# Patient Record
Sex: Male | Born: 1955 | Race: Black or African American | Hispanic: No | Marital: Single | Smoking: Light tobacco smoker
Health system: Southern US, Community
[De-identification: ages and names within clinical notes are randomized; demographics above are authoritative.]

## PROBLEM LIST (undated history)

## (undated) DIAGNOSIS — I639 Cerebral infarction, unspecified: Secondary | ICD-10-CM

## (undated) DIAGNOSIS — F1010 Alcohol abuse, uncomplicated: Secondary | ICD-10-CM

## (undated) DIAGNOSIS — I714 Abdominal aortic aneurysm, without rupture, unspecified: Secondary | ICD-10-CM

## (undated) DIAGNOSIS — I619 Nontraumatic intracerebral hemorrhage, unspecified: Secondary | ICD-10-CM

## (undated) DIAGNOSIS — N183 Chronic kidney disease, stage 3 unspecified: Secondary | ICD-10-CM

## (undated) DIAGNOSIS — F1410 Cocaine abuse, uncomplicated: Secondary | ICD-10-CM

## (undated) DIAGNOSIS — I10 Essential (primary) hypertension: Secondary | ICD-10-CM

## (undated) DIAGNOSIS — D649 Anemia, unspecified: Secondary | ICD-10-CM

## (undated) DIAGNOSIS — R569 Unspecified convulsions: Secondary | ICD-10-CM

## (undated) DIAGNOSIS — R739 Hyperglycemia, unspecified: Secondary | ICD-10-CM

## (undated) DIAGNOSIS — E039 Hypothyroidism, unspecified: Secondary | ICD-10-CM

## (undated) DIAGNOSIS — K219 Gastro-esophageal reflux disease without esophagitis: Secondary | ICD-10-CM

## (undated) DIAGNOSIS — N289 Disorder of kidney and ureter, unspecified: Secondary | ICD-10-CM

## (undated) HISTORY — DX: Abdominal aortic aneurysm, without rupture, unspecified: I71.40

## (undated) HISTORY — PX: EYE SURGERY: SHX253

## (undated) HISTORY — DX: Abdominal aortic aneurysm, without rupture: I71.4

---

## 2005-08-17 ENCOUNTER — Ambulatory Visit: Payer: Self-pay | Admitting: Physical Medicine & Rehabilitation

## 2005-08-17 ENCOUNTER — Encounter: Payer: Self-pay | Admitting: Emergency Medicine

## 2005-08-17 ENCOUNTER — Inpatient Hospital Stay (HOSPITAL_COMMUNITY): Admission: EM | Admit: 2005-08-17 | Discharge: 2005-08-27 | Payer: Self-pay | Admitting: Emergency Medicine

## 2005-09-22 ENCOUNTER — Inpatient Hospital Stay (HOSPITAL_COMMUNITY): Admission: AD | Admit: 2005-09-22 | Discharge: 2005-10-01 | Payer: Self-pay | Admitting: Neurology

## 2005-09-22 ENCOUNTER — Ambulatory Visit: Payer: Self-pay | Admitting: Pulmonary Disease

## 2005-09-22 ENCOUNTER — Ambulatory Visit: Payer: Self-pay | Admitting: Cardiology

## 2005-09-22 ENCOUNTER — Encounter: Payer: Self-pay | Admitting: Emergency Medicine

## 2005-09-23 ENCOUNTER — Encounter (INDEPENDENT_AMBULATORY_CARE_PROVIDER_SITE_OTHER): Payer: Self-pay | Admitting: Cardiology

## 2005-11-13 ENCOUNTER — Inpatient Hospital Stay (HOSPITAL_COMMUNITY): Admission: EM | Admit: 2005-11-13 | Discharge: 2005-11-14 | Payer: Self-pay | Admitting: Emergency Medicine

## 2006-11-23 ENCOUNTER — Emergency Department (HOSPITAL_COMMUNITY): Admission: EM | Admit: 2006-11-23 | Discharge: 2006-11-23 | Payer: Self-pay | Admitting: Emergency Medicine

## 2007-02-19 IMAGING — CR DG CHEST 1V PORT
1 series · 1 of 1 positions shown · non-contrast
Comparison: 09/22/2005 and earlier the same day.

CLINICAL DATA: Reposition of ET tube.  
 PORTABLE CHEST - 1 VIEW:

[view not recorded]
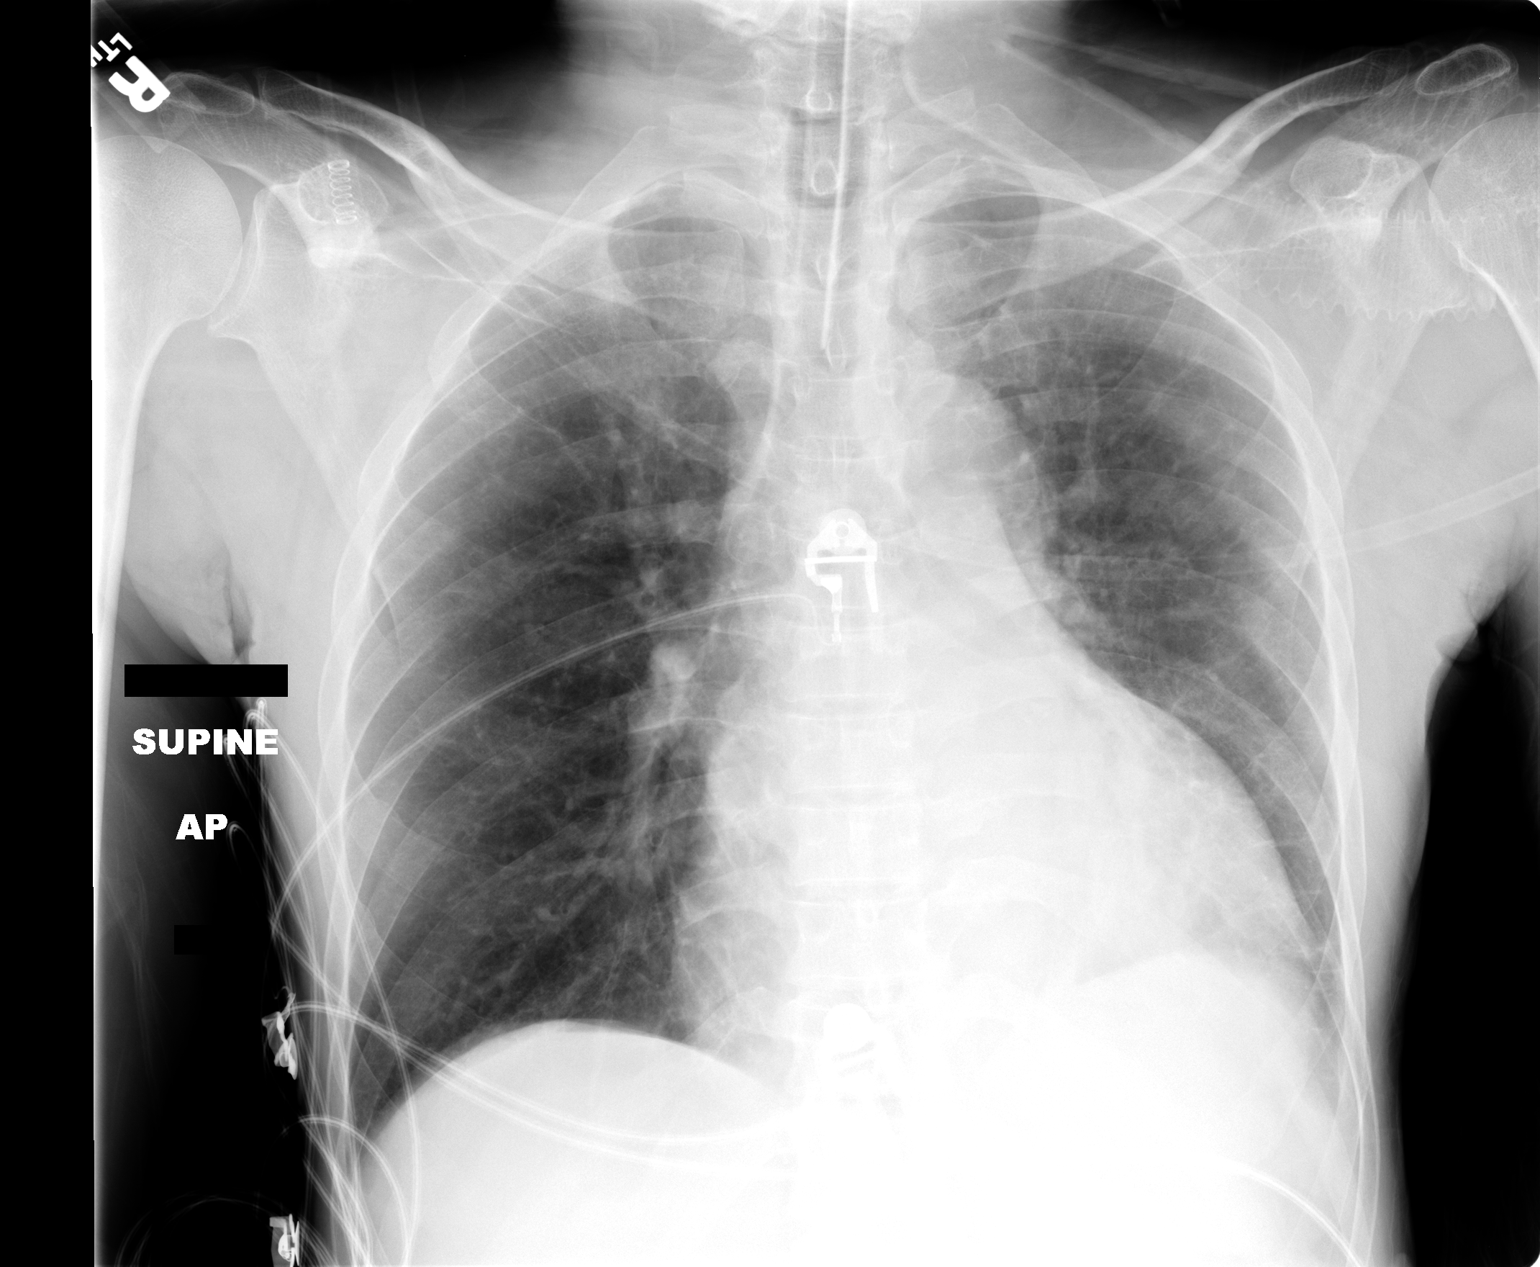

[1 of 1 positions shown; findings below may reference images not displayed]

Endotracheal tube tip is now 4.6 cm above the base of the carina.  The cardiopericardial silhouette remains enlarged.  Interstitial markings are diffusely coarsened with chronic features. Atelectasis at the left base again noted.
IMPRESSION: 1.   ET tube tip is 4.6 cm above the base of the carina. 
 2.  Cardiomegaly with basilar atelectasis.

## 2007-02-20 IMAGING — CT CT HEAD W/O CM
1 of 2 series · 13 of 30 positions shown, 17 images · non-contrast
Comparison: 09/22/05 and 08/17/05.
 HEAD CT WITHOUT CONTRAST:

CLINICAL DATA: Intracranial hemorrhage.
TECHNIQUE: Contiguous axial images were obtained from the base of the skull through the vertex, according to standard protocol, without contrast.

[Series 2: brain · axial · 0.47mm/px · z∈[+138,+265]mm · 13 of 40 slices shown, 17 images]
[im 3/40  brain]
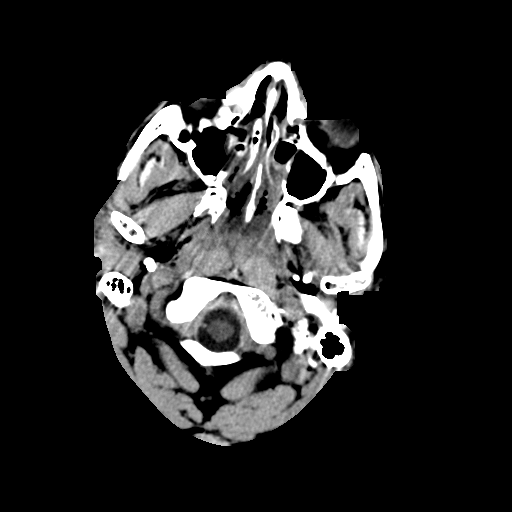
[im 3/40  bone]
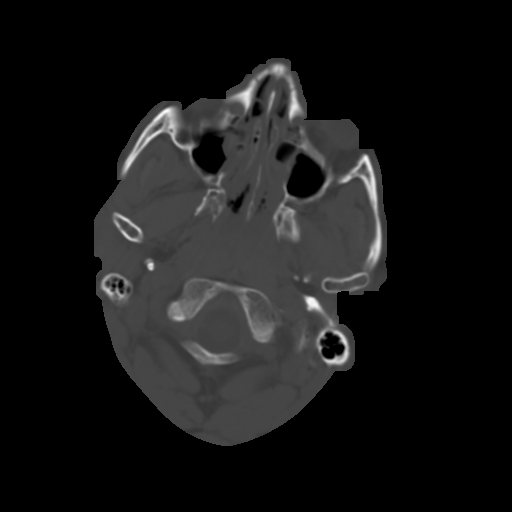
[im 6/40  brain]
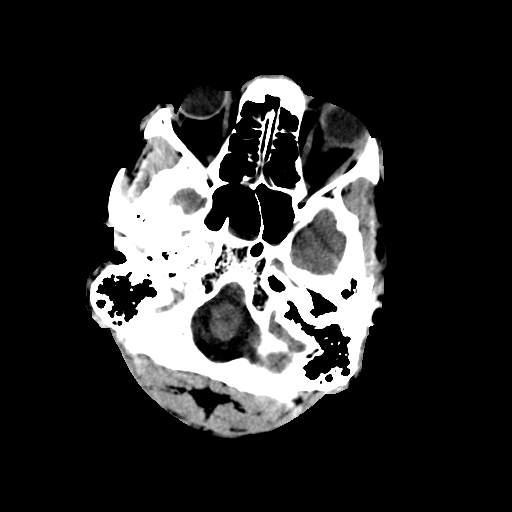
[im 9/40  brain]
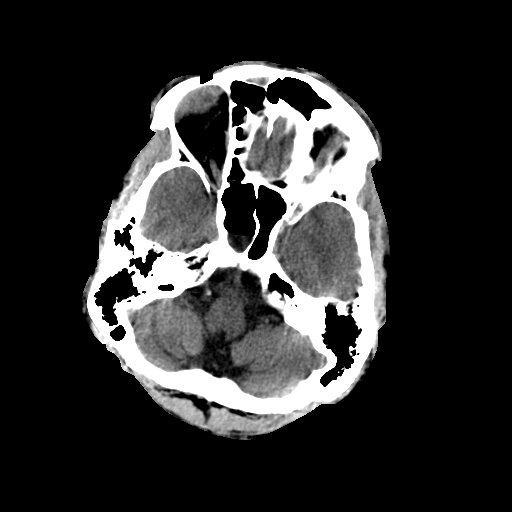
[im 12/40  brain]
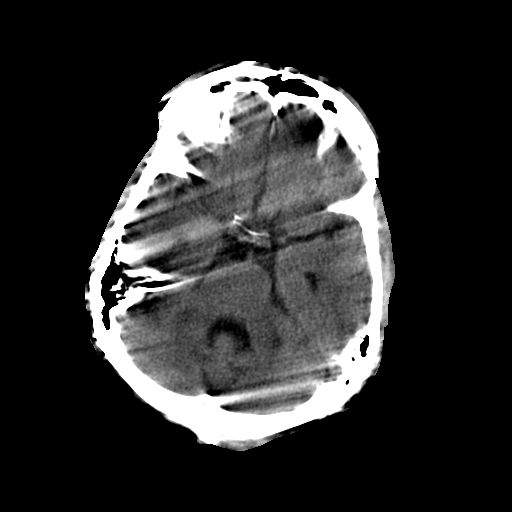
[im 14/40  brain]
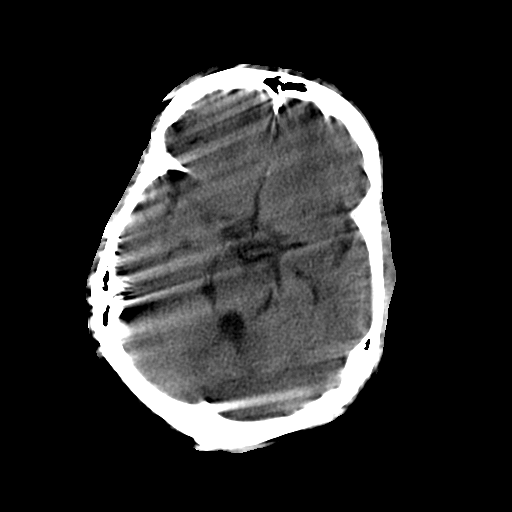
[im 14/40  bone]
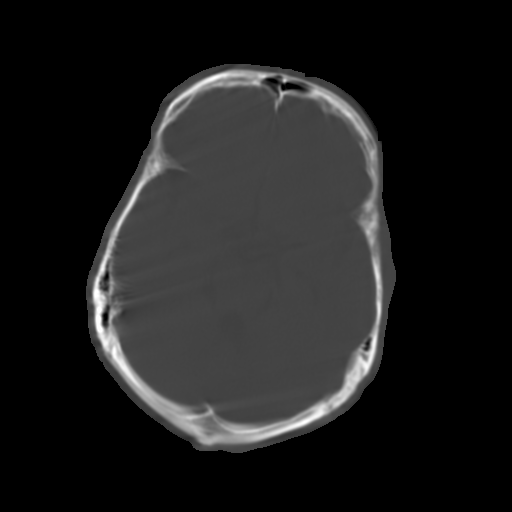
[im 17/40  brain]
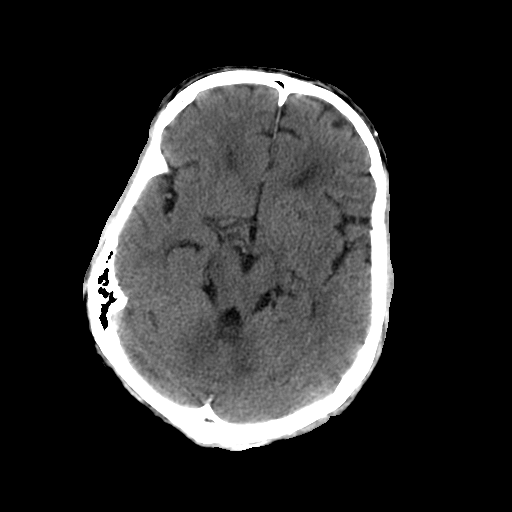
[im 20/40  brain]
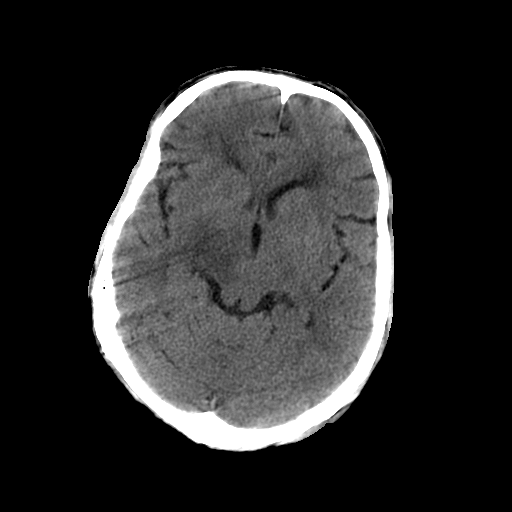
[im 23/40  brain]
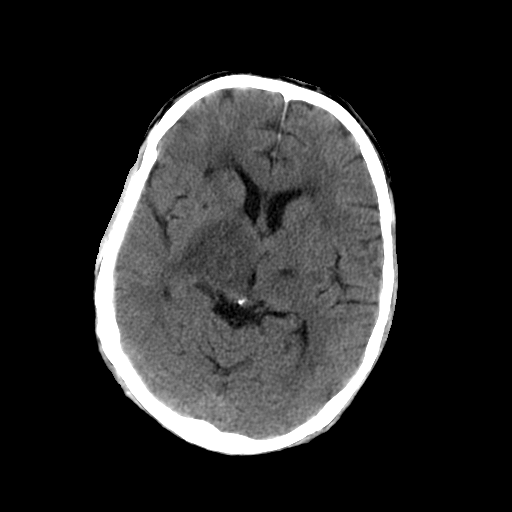
[im 26/40  brain]
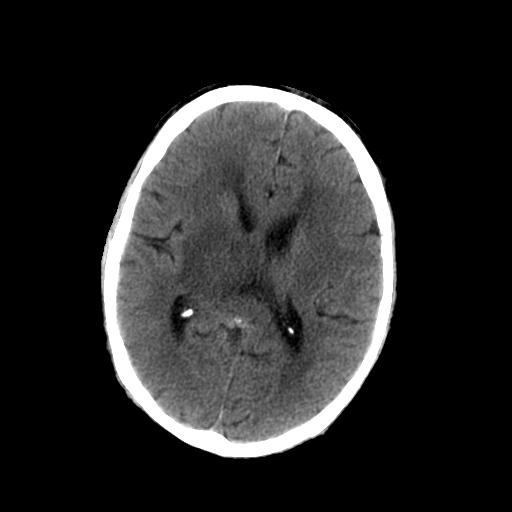
[im 26/40  bone]
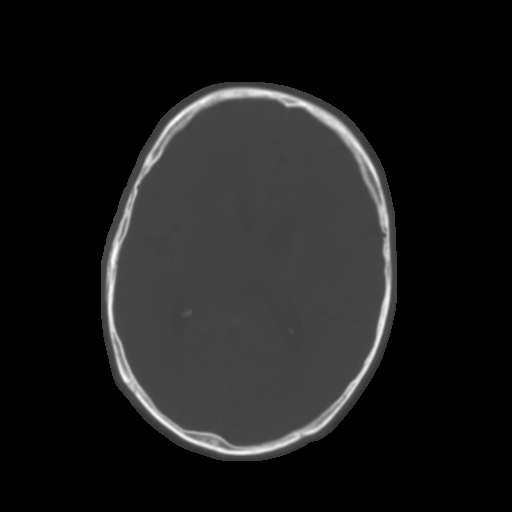
[im 28/40  brain]
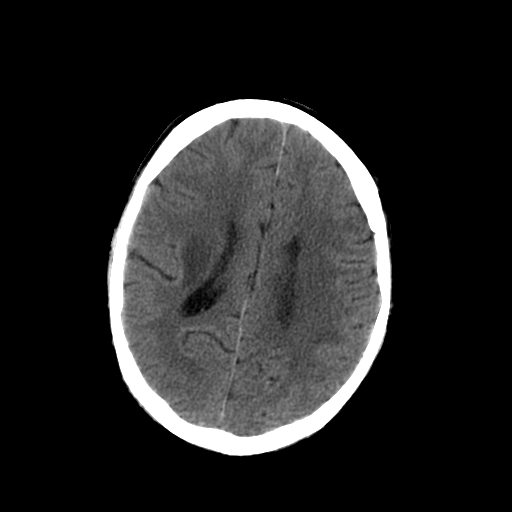
[im 31/40  brain]
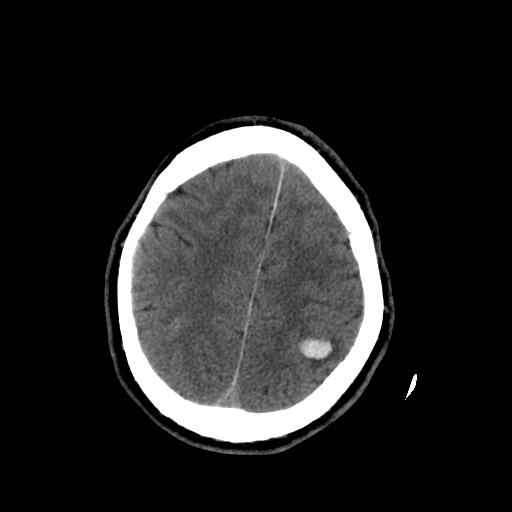
[im 34/40  brain]
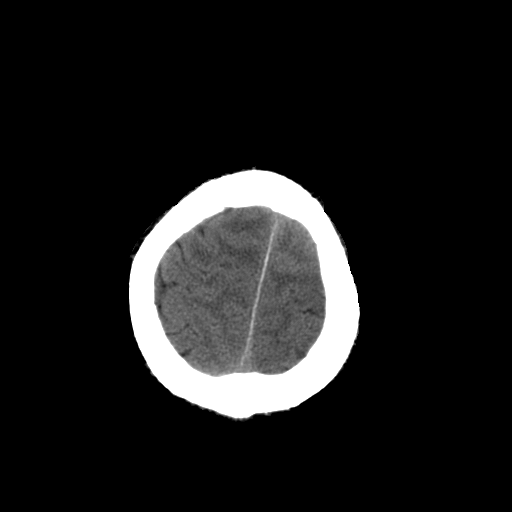
[im 37/40  brain]
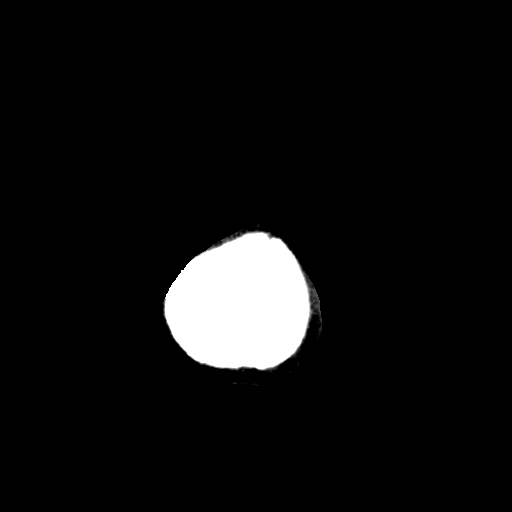
[im 37/40  bone]
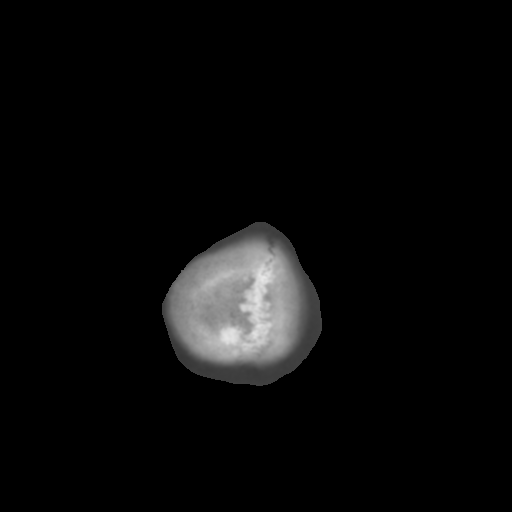

[13 of 30 positions shown; findings below may reference images not displayed]

FINDINGS: Again seen is a left parietal lobe hemorrhage, which appears unchanged.  Edema is again seen in the right thalamus, also without change.  There is minimal right to left midline shift.  No new hemorrhage is identified.  Mass effect on the third ventricle is unchanged.  White matter ischemic change and left basal ganglia and right caudate lacunar infarctions are again noted.  The patient?s examination is otherwise unchanged.  NG tube remains in place.  There is opacification of scattered ethmoid air cells.
IMPRESSION: 1.  No interval change in left parietal hemorrhage.
 2.  Evolution of right thalamic hemorrhage.  As noted on prior exam, underlying mass is not excluded on non-contrast head CT.

## 2007-04-24 ENCOUNTER — Emergency Department (HOSPITAL_COMMUNITY): Admission: EM | Admit: 2007-04-24 | Discharge: 2007-04-24 | Payer: Self-pay | Admitting: Emergency Medicine

## 2007-05-13 ENCOUNTER — Emergency Department (HOSPITAL_COMMUNITY): Admission: EM | Admit: 2007-05-13 | Discharge: 2007-05-13 | Payer: Self-pay | Admitting: Emergency Medicine

## 2007-09-20 ENCOUNTER — Inpatient Hospital Stay (HOSPITAL_COMMUNITY): Admission: EM | Admit: 2007-09-20 | Discharge: 2007-10-02 | Payer: Self-pay | Admitting: Emergency Medicine

## 2008-06-17 ENCOUNTER — Emergency Department (HOSPITAL_COMMUNITY): Admission: EM | Admit: 2008-06-17 | Discharge: 2008-06-17 | Payer: Self-pay | Admitting: Emergency Medicine

## 2009-02-19 IMAGING — CR DG CHEST 1V PORT
1 series · 1 of 1 positions shown · non-contrast
Comparison: 09/19/06.

CLINICAL DATA: PICC placement. 
 PORTABLE CHEST ? 1 VIEW:

[view not recorded]
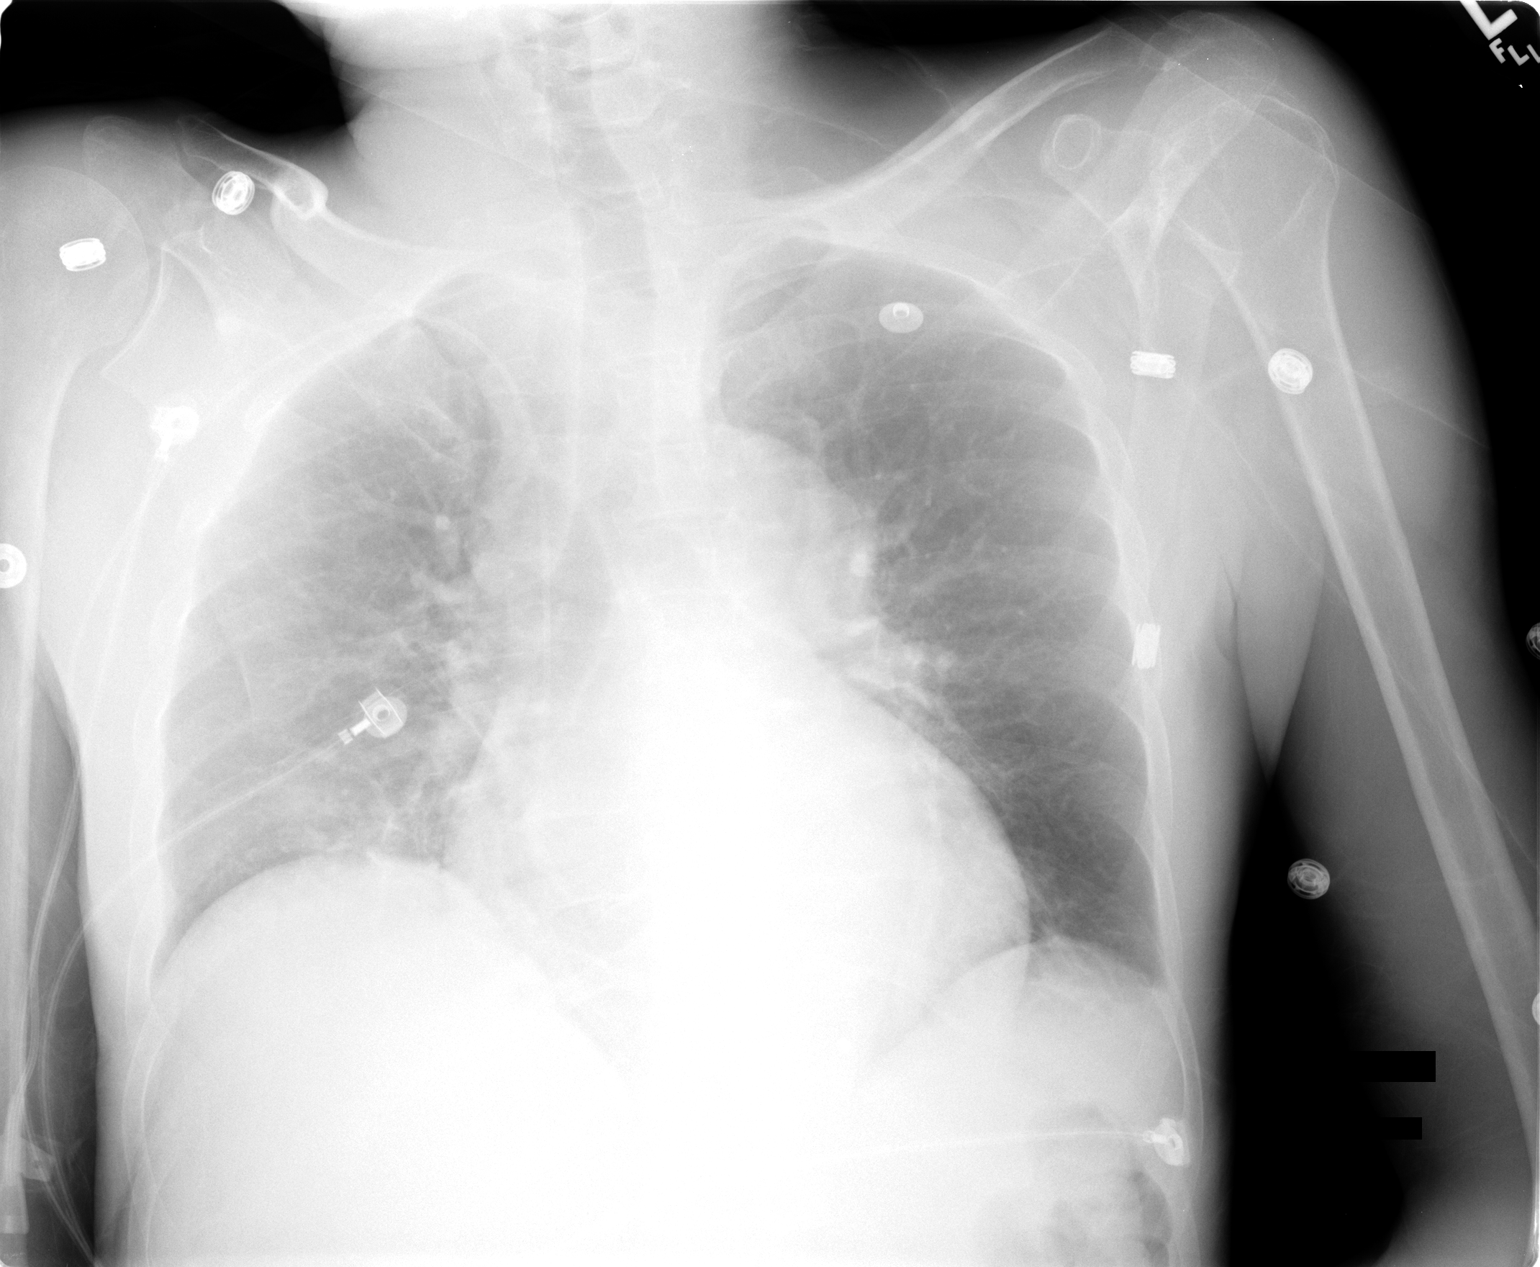

[1 of 1 positions shown; findings below may reference images not displayed]

FINDINGS: Patient has a new right-sided PICC at the superior cavoatrial junction.  No pneumothorax.  There is mild right base atelectasis.  The lungs are otherwise clear.  Cardiomegaly.
IMPRESSION: 1.  PICC in place without complicating features. 
 2.  Mild right basilar atelectasis.

## 2009-06-09 ENCOUNTER — Ambulatory Visit (HOSPITAL_COMMUNITY): Admission: RE | Admit: 2009-06-09 | Discharge: 2009-06-09 | Payer: Self-pay | Admitting: Ophthalmology

## 2009-07-18 ENCOUNTER — Ambulatory Visit (HOSPITAL_COMMUNITY): Admission: RE | Admit: 2009-07-18 | Discharge: 2009-07-18 | Payer: Self-pay | Admitting: Family Medicine

## 2010-01-17 ENCOUNTER — Emergency Department (HOSPITAL_COMMUNITY): Admission: EM | Admit: 2010-01-17 | Discharge: 2010-01-17 | Payer: Self-pay | Admitting: Emergency Medicine

## 2010-01-17 ENCOUNTER — Encounter: Payer: Self-pay | Admitting: Orthopedic Surgery

## 2010-01-18 ENCOUNTER — Ambulatory Visit: Payer: Self-pay | Admitting: Orthopedic Surgery

## 2010-01-18 DIAGNOSIS — S92009A Unspecified fracture of unspecified calcaneus, initial encounter for closed fracture: Secondary | ICD-10-CM

## 2010-01-25 ENCOUNTER — Encounter: Payer: Self-pay | Admitting: Orthopedic Surgery

## 2010-01-30 ENCOUNTER — Ambulatory Visit (HOSPITAL_COMMUNITY): Admission: RE | Admit: 2010-01-30 | Discharge: 2010-01-30 | Payer: Self-pay | Admitting: Ophthalmology

## 2010-02-08 ENCOUNTER — Ambulatory Visit (HOSPITAL_COMMUNITY): Admission: AD | Admit: 2010-02-08 | Discharge: 2010-02-08 | Payer: Self-pay | Admitting: Ophthalmology

## 2010-03-01 ENCOUNTER — Ambulatory Visit: Payer: Self-pay | Admitting: Orthopedic Surgery

## 2010-04-06 ENCOUNTER — Telehealth: Payer: Self-pay | Admitting: Orthopedic Surgery

## 2010-04-10 ENCOUNTER — Telehealth: Payer: Self-pay | Admitting: Orthopedic Surgery

## 2010-04-18 ENCOUNTER — Encounter: Payer: Self-pay | Admitting: Orthopedic Surgery

## 2010-10-02 NOTE — Miscellaneous (Signed)
Summary: Nursing Home order  Nursing Home order   Imported By: Ihor Austin 01/22/2010 10:17:47  _____________________________________________________________________  External Attachment:    Type:   Image     Comment:   External Document

## 2010-10-02 NOTE — Progress Notes (Signed)
Summary: decreased PT to 2 x per week  Phone Note Other Incoming Call back at [facility] from [facility]   Summary of Call: says patient said he is walking just as well as he was before injury, wants to know if they can decrease fro 3 x week to 1 or 2, I advised yes as long as they were satisfied with his progress Initial call taken by: Peter Minium,  April 10, 2010 4:06 PM

## 2010-10-02 NOTE — Letter (Signed)
Summary: History form  History form   Imported By: Ruffin Pyo 01/26/2010 08:56:35  _____________________________________________________________________  External Attachment:    Type:   Image     Comment:   External Document

## 2010-10-02 NOTE — Assessment & Plan Note (Signed)
Summary: 30 WK RE-CK/XRAY HEEL/FX CARE/[place] MEDICAID/CAF   Visit Type:  Follow-up Referring Provider:  [facility] Primary Provider:  Dr. Cindie Laroche  CC:  left heelfracture.Marland Kitchen  History of Present Illness: I saw Kurt Nelson in the office today for a followup visit.  He is a 55 years old man with the complaint of:  left heel fracture.  left heel pain after fall on 01/17/10.  Left foot xrays [facility] 01/17/10.  Xrays today OOP.  XRAYS FRACTURE HEALED AND THE SUBTALAR JOINT LOOKS GOOD    EXAM THE FOOT IS PALNTARGRADE, NO TENDERNESS OR SWELLING, ANKLE ROM IS NORMAL         Impression & Recommendations:  Problem # 1:  CALCANEAL FRACTURE, LEFT (ICD-825.0) Assessment Improved  Orders: Foot x-ray complete, minimum 3 views [id number]) Post-Op Check [id number])  Patient Instructions: 1)  Please schedule a follow-up appointment as needed.

## 2010-10-02 NOTE — Progress Notes (Signed)
Summary: WB status?  Phone Note Other Incoming   Summary of Call: Mike/[facility] wants to know the WB status for Her Gerhardt ([birth year]) Mike's # [phone] Initial call taken by: Ruffin Pyo,  April 10, 2010 3:12 PM  Follow-up for Phone Call        as tolerated  Follow-up by: Arther Abbott MD,  April 10, 2010 3:13 PM  Additional Follow-up for Phone Call Additional follow up Details #1::        Advised Ronalee Belts of doctor's reply Additional Follow-up by: Ruffin Pyo,  April 10, 2010 3:45 PM

## 2010-10-02 NOTE — Medication Information (Signed)
Summary: [employer] merchand[employer]   Imported By: Ihor Austin 03/07/2010 17:27:15  _____________________________________________________________________  External Attachment:    Type:   Image     Comment:   External Document

## 2010-10-02 NOTE — Progress Notes (Signed)
Summary: call from [facility], request for PT  Phone Note Other Incoming   Caller: [place] facility Summary of Call: Maudie Mercury from [facility] called to request order for physical therapy at their facility for patient to be able to start walking w/walker again. [facility] use [facility].  Ph# at [facility], [place], [place]. Initial call taken by: Ihor Austin,  April 06, 2010 10:06 AM  Follow-up for Phone Call        ok Follow-up by: Arther Abbott MD,  April 09, 2010 8:22 AM

## 2010-10-02 NOTE — Letter (Signed)
Summary: Medication list  Medication list   Imported By: Ruffin Pyo 01/31/2010 16:52:21  _____________________________________________________________________  External Attachment:    Type:   Image     Comment:   External Document

## 2010-10-02 NOTE — Assessment & Plan Note (Signed)
Summary: [facility] FOL/UP/FX LT HEEL BONE/MEDICARE,MEDICD/CAF   Visit Type:  new patient Referring Provider:  [facility] Primary Provider:  Dr. Cindie Laroche  CC:   left heel pain.Marland Kitchen  History of Present Illness: I saw Kurt Nelson in the office today for an initial visit.  He is a 55 years old man with the complaint of:  left heel pain after fall on 01/17/10.  Left foot xrays [facility] 01/17/10.  The patient has pain in his LEFT heel which is moderate in severity present for one day after a fall when he lost balance with his walker, pain is constant.  Associated symptoms  swelling.  Physical Exam  Additional Exam:  vital signs weight was deferred height was deferred, he is afebrile pulse of 88 respiratory rate of 20, appearance is normal grooming is normal  He's oriented x3 his mood and affect are normal  Is in a wheelchair he can walk without pain or and his walker  His heel is swollen and tender his range of motion in the ankle joint is normal.  The ankle is stable.  Muscle tone is normal.  Skin is intact.  Pulses in the foot are normal, temperature normal.  Lymph nodes negative.  Sensation is normal.  Toes are downgoing.  Coordination and balance are deferred.     Impression & Recommendations:  Problem # 1:  CALCANEAL FRACTURE, LEFT (ICD-825.0) Assessment New  short leg nonweightbearing cast for 6 weeks and x-ray out of plaster  Orders: New Patient Level III [id number]) EMR Misc Charge Code [facility])  Patient Instructions: 1)  do not weight bear  2)  transport in wheel chair only for 6 weeks  3)  then xrays OOP

## 2010-10-02 NOTE — Miscellaneous (Signed)
Summary: [facility] plan of care  [facility] plan of care   Imported By: Ruffin Pyo 04/23/2010 13:47:46  _____________________________________________________________________  External Attachment:    Type:   Image     Comment:   External Document

## 2010-11-19 LAB — GRAM STAIN

## 2010-11-19 LAB — CBC
HCT: 31.7 % — ABNORMAL LOW (ref 39.0–52.0)
Hemoglobin: 10.5 g/dL — ABNORMAL LOW (ref 13.0–17.0)
MCHC: 33.1 g/dL (ref 30.0–36.0)
MCV: 84.6 fL (ref 78.0–100.0)
RDW: 12.5 % (ref 11.5–15.5)

## 2010-11-19 LAB — BASIC METABOLIC PANEL
BUN: 27 mg/dL — ABNORMAL HIGH (ref 6–23)
CO2: 19 mEq/L (ref 19–32)
CO2: 20 mEq/L (ref 19–32)
Calcium: 9.3 mg/dL (ref 8.4–10.5)
Creatinine, Ser: 2.21 mg/dL — ABNORMAL HIGH (ref 0.4–1.5)
GFR calc Af Amer: 38 mL/min — ABNORMAL LOW (ref >60)
GFR calc non Af Amer: 31 mL/min — ABNORMAL LOW (ref >60)
Glucose, Bld: 137 mg/dL — ABNORMAL HIGH (ref 70–99)
Potassium: 5.3 mEq/L — ABNORMAL HIGH (ref 3.5–5.1)
Sodium: 135 mEq/L (ref 135–145)

## 2010-11-19 LAB — POCT I-STAT 4, (NA,K, GLUC, HGB,HCT): HCT: 34 % — ABNORMAL LOW (ref 39.0–52.0)

## 2010-11-19 LAB — HEMOGLOBIN AND HEMATOCRIT, BLOOD
HCT: 32.9 % — ABNORMAL LOW (ref 39.0–52.0)
Hemoglobin: 10 g/dL — ABNORMAL LOW (ref 13.0–17.0)

## 2010-12-06 LAB — BASIC METABOLIC PANEL
Chloride: 110 mEq/L (ref 96–112)
GFR calc non Af Amer: 26 mL/min — ABNORMAL LOW (ref >60)
Glucose, Bld: 74 mg/dL (ref 70–99)
Potassium: 6.2 mEq/L — ABNORMAL HIGH (ref 3.5–5.1)
Sodium: 137 mEq/L (ref 135–145)

## 2011-01-15 NOTE — H&P (Signed)
Kurt Nelson, CRAKER                  ACCOUNT NO.:  [id number]   MEDICAL RECORD NO.:  [id number]          PATIENT TYPE:  INP   LOCATION:  A220                          FACILITY:  [facility]   PHYSICIAN:  Paula Compton. Willey Blade, MD       DATE OF BIRTH:  Sep 19, 1955   DATE OF ADMISSION:  09/20/2007  DATE OF DISCHARGE:  LH                              HISTORY & PHYSICAL   HISTORY OF PRESENT ILLNESS:  This patient is a 55 year old African-  American male who presented to the emergency room from [facility] rest  home, after he had noted possible fever.  He was evaluated in the  emergency room and found to have a fever of 105.0.  His urinalysis  revealed too numerous to count white cells.  He had not previously  experienced dysuria, but he had some discomfort when the catheter was  placed.  He was found to be in acute renal failure with a BUN and  creatinine of 67 and 6.29.  Previous values had been normal.  He states  he had been eating and drinking very little for several days.   PAST MEDICAL HISTORY:  1. Hypertension.  2. Stroke.  3. GERD.   MEDICATIONS:  1. Labetalol 600 mg b.i.d.  2. Amlodipine 10 mg daily.  3. Remeron 15 mg q.h.s.  4. Dilantin 300 mg q.h.s.  5. Benicar HCT 40/25 daily.  6. Omeprazole 20 daily.   ALLERGIES:  Penicillin which caused a rash.   SOCIAL HISTORY:  He is retired from [employer], does not  smoke or drink.  He lives at [facility].   REVIEW OF SYSTEMS:  His throat has felt a little irritated recently with  swallowing.  He denies chest pain, abdominal pain or vomiting.  He  states he had a loose stool yesterday.   PHYSICAL EXAMINATION:  VITAL SIGNS:  His temperature 105.0 rectally,  pulse 118, respirations 22, blood pressure 147/94, oxygen saturation  95%. GENERAL:  Weak-appearing but alert with what appears to be a  chronic speech alteration from a stroke.  HEENT:  He has a left dense cataract, no scleral icterus.  The pharynx  reveals few remaining  teeth.  No exudate is visible.  NECK:  No lymphadenopathy or thyromegaly.  LUNGS:  Clear.  HEART:  Tachycardia with no murmurs.  ABDOMEN:  Nontender.  No hepatosplenomegaly.  EXTREMITIES:  No cyanosis, clubbing or edema.  SKIN:  Warm and dry.  NEURO:  Gait was not tested.   LABORATORY DATA:  White count 14.8, hemoglobin 9.6, platelets 208,000,  89 segs, 5 lymphs.  Sodium 135, potassium 5.3, chloride 105, bicarb 18,  glucose 154, BUN 67, creatinine 6.29, calcium 9.0.  Urinalysis reveals  too numerous to count white cells, 21 to 50 red cells and many bacteria.  His Dilantin level is pending.  His chest x-ray reveals no acute  infiltrate.   IMPRESSION:  1. Urinary tract infection.  2. Acute renal acute renal failure/dehydration.  3. Hypertension.  4. Normocytic anemia.  5. Hyperglycemia.  6. Seizure disorder.  7. Gastroesophageal  reflux disease.  8. History of stroke.  9. Pharyngitis.  Strep screen is negative.   PLAN:  Blood and urine cultures.  1. Blood cultures x2.  2. Urine culture.  3. Rocephin 1 gram IV q.24 h.  4. Vancomycin 1 gram IV now.  5. Foley catheter.  6. IV hydration.  7. Continue labetalol and amlodipine, but hold Benicar HCT.      Paula Compton. Willey Blade, MD  Electronically Signed     ROF/MEDQ  D:  09/20/2007  T:  09/20/2007  Job:  [id number]   cc:   Unk Lightning, MD  Fax: [phone]

## 2011-01-15 NOTE — Discharge Summary (Signed)
Kurt Nelson, Kurt Nelson                  ACCOUNT NO.:  [id number]   MEDICAL RECORD NO.:  [id number]          PATIENT TYPE:  INP   LOCATION:  A220                          FACILITY:  [facility]   PHYSICIAN:  Unk Lightning, MDDATE OF BIRTH:  1956/08/15   DATE OF ADMISSION:  09/20/2007  DATE OF DISCHARGE:  LH                               DISCHARGE SUMMARY   The patient is a 55 year old African American male who presented to the  emergency room with increasing fever, temperature he came in with 103.  In the emergency room, found to have a UTI, considered urosepsis.  He  also was noted to have renal failure which is probably a component of  acute on chronic with a BUN of 67 and a creatinine of 6.29.  Patient was  admitted, given IV fluids, given Rocephin, and then ultimately  vancomycin.  Cultures were drawn from the blood revealing E. coli,  septicemia.  While in the hospital, he continued to have a spiking high  temperature, was placed on a cooling blanket.  His hypertension was well  controlled initially then seemed to spike  through the latter part of  his admission.  His E. coli septicemia resolved, his blood pressure was  under better control with the addition of 2 other agents for blood  pressure control, and he was deemed to be clinically stable.  His BUN  and creatinine dropped to 52 and 4.19.  He was followed by renal which  will follow him closely.   DISCHARGE MEDICINES:  1. Labetalol 600 mg p.o. b.i.d.  2. Norvasc 10 mg daily.  3. Benicar 50/25 p.o. daily.  4. Catapres TTS patch 2 weekly.  5. Minoxidil 2.5 mg p.o. t.i.d.   He will follow up in the office in 1 week's time for monitoring of renal  function.      Unk Lightning, MD  Electronically Signed     RMD/MEDQ  D:  10/01/2007  T:  10/01/2007  Job:  [id number]

## 2011-01-15 NOTE — Consult Note (Signed)
NAMESHYMIR, LAHRMAN                  ACCOUNT NO.:  [id number]   MEDICAL RECORD NO.:  [id number]          PATIENT TYPE:  INP   LOCATION:  A220                          FACILITY:  [facility]   PHYSICIAN:  Alison Murray, M.D.DATE OF BIRTH:  1956-05-29   DATE OF CONSULTATION:  DATE OF DISCHARGE:                                 CONSULTATION   NEPHROLOGY CONSULTATION   REASON FOR CONSULTATION:  Worsening of renal failure.   HISTORY OF PRESENT ILLNESS:  Mr. Butler is 55 years old African-American  male with past medical history of hypertension, history of cerebral  ischemic stroke and also right basal ganglia hemorrhagic stroke, history  of seizure and chronic renal failure presently was brought for fever and  history of urinary tract infection.  When the patient was admitted his  creatinine was found to be more than 6.0 and hence at this moment  consult is called.  At this moment, it is very difficult to get any more  history from Mr. Slyter, however, checking his work up, the patient seems  to have chronic renal failure at least dating back in 2006 with baseline  creatinine around that time at 1.9.  Since then his creatinine has  increased and recently has been between 2.5 and 3.0.  He has had  previous work up.  The etiology was not known as the patient has history  of uncontrolled hypertension and cocaine abuse, possibly hypertensive  nephrosclerosis __________ during that time.  At this moment, he does  not have any nausea or vomiting.  He denies any shortness of breath,  dizziness or light headedness.   PAST MEDICAL HISTORY:  As stated above patient has past history of  seizure, history of anemia, history of cocaine abuse, history of right  basal ganglia hemorrhage, history of ischemic stroke, history of GERD,  history of seizure, history of anemia, history of hypertension, history  of chronic renal failure, with baseline creatinine between 2.5 and 3.0.   MEDICATIONS:  His medications at  this moment consist of :  1. Norvasc 10 mg p.o. daily.  2. Rocephin 1 gram IV q.24h.  3. Normodyne 600 mg p.o. b.i.d.  4. Remeron 15 mg p.o. nightly  5. Protonix 40 mg p.o. daily.  6. Dilantin 300 mg p.o. nightly.  7. Intravenous fluids are at 100 cc per hour.  8. He is getting Tylenol on a p.r.n. basis.   ALLERGIES:  He is ALLERGIC to PENICILLIN.   SOCIAL HISTORY:  Presently, the patient is a resident of [facility].  Previous history of alcohol abuse and also cocaine use.   REVIEW OF SYSTEMS:  He denies any nausea, vomiting.  His head is feeling  better.  He does not have any nausea, no vomiting.  Appetite is good.   PHYSICAL EXAMINATION:  GENERAL APPEARANCE:  On examination the patient  is continuously shaking with his head and also his hands, probably  secondary to his basal ganglia hemorrhage.  VITAL SIGNS:  Temperature is 99.  Blood pressure  is 148/88.  Pulse of  106.  His urine output over the  last 24 hours was 1700.   LABORATORY DATA:  His white blood cell count is 14.8, hemoglobin 9.6,  hematocrit 28.8.  Sodium is 140, potassium 5.3.  CO2 18.  BUN is 79,  creatinine 6.8. When he came his BUN was 67 and creatinine 6.2.  In  August of 2008 his creatinine had been 2.5.  He has had previous iron  studies with Ferritin of 379 and iron saturation of 32.  He has had an  ultrasound which was done in 2007 which showed right kidney to be 10.1  and left kidney 10.2.   ASSESSMENT:  1. Renal insufficiency at this moment, acute on chronic.  The etiology      for worsening of his renal failure probably may be secondary to      prerenal syndrome, however, other etiologies such as acute tubular      necrosis cannot be ruled out.  2. Underlying chronic renal disease with baseline creatinine about 2.5      to 3.0.  He has a previous history of uncontrolled hypertension,      cocaine abuse, possibly hypertensive nephrosclerosis.  3. History of urinary tract infection.  He is on  antibiotics presently      and he is afebrile.  White blood cell count is __________ elevated.  4. History of anemia.  His iron saturation seems to be normal with      high Ferritin.  He has anemia of chronic disease.  5. History of seizure disorder.  He is on Dilantin.  6. History of hypertension.  Blood pressure seems to be controlled      very well.  7. History of gastroesophageal reflux disease.  He is on Protonix.  8. History of basal ganglia hemorrhage with hemiparesis and profound      shaking.  9. History of ischemic stroke.   RECOMMENDATIONS:  I agree with hydration.  At this moment will do an  ultrasound of the kidneys to see if the patient has obstruction or some  significant change from his previous ultrasound.  I agree with  discontinuation of ACE inhibitor.  Will continue his intravenous fluids  at 125 cc per hour and continue with all his medications and will follow  patient.  I will check his phosphorus and intact PTH.      Alison Murray, M.D.  Electronically Signed     BB/MEDQ  D:  09/22/2007  T:  09/22/2007  Job:  [id number]

## 2011-01-18 NOTE — Discharge Summary (Signed)
Kurt Nelson, Kurt Nelson                  ACCOUNT NO.:  [id number]   MEDICAL RECORD NO.:  [id number]          PATIENT TYPE:  INP   LOCATION:  A204                          FACILITY:  [facility]   PHYSICIAN:  Vanetta Mulders. Dechurch, M.D.DATE OF BIRTH:  [birth year]   DATE OF ADMISSION:  11/13/2005  DATE OF DISCHARGE:  03/15/2007LH                                 DISCHARGE SUMMARY   DIAGNOSES:  1.  Seizure disorder.  2.  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  3.  Ventilator respiratory failure secondary to intracranial hemorrhage      September 22, 2005.  4.  Uncontrolled hypertension.  5.  History of substance abuse though drug screen negative this admission.  6.  Chronic renal insufficiency, probably secondary to hypertension.  7.  Medical noncompliance secondary to financial issues.   Patient is a 55 year old African-American gentleman who has a complicated  past medical history since December when he had two intracranial bleeds  associated with hypertension and probable cocaine abuse. In any event, he  has been doing well at home until today when he had what was described as a  seizure. He presented to the emergency room post ictal. Follow-up CT scan  showed no acute changes. Metabolically, he was intact. He was back to  baseline status at the time I evaluation. In any event, he was seen in  consultation by Dr. Merlene Laughter who recommended Dilantin 300 mg at bedtime to  continue as well as to use brand necessary. He will follow him up as an  outpatient and proceed with an EEG. He was counseled regarding his  compliance and substance abuse.   DISCHARGE MEDICATIONS:  His medications at the time of discharge include:  1.  Clonidine 0.1 b.i.d.  2.  Norvasc 10 mg daily.  3.  Labetalol 600 mg b.i.d.  4.  Hydrochlorothiazide 12.5 mg daily.  5.  Dilantin 300 mg brand necessary at h.s.   He will need a follow-up CMP and Dilantin level  per Dr. Merlene Laughter. He follows  with Dr. Tamala Julian, though he has not seen him in quite some time. In any event,  he is being discharged to home in stable condition. His blood pressures here  in the brief stay in the hospital were much better controlled, this morning  127/84, with medications. Creatinine is 2, BUN is 23, potassium 3.5.      Vanetta Mulders Hillery Jacks, M.D.  Electronically Signed     FED/MEDQ  D:  11/14/2005  T:  11/15/2005  Job:  [id number]   cc:   Tamala Julian, M.D.   Kofi A. Merlene Laughter, M.D.  Fax: [phone]

## 2011-01-18 NOTE — H&P (Signed)
[name], [name]                  ACCOUNT NO.:  [id number]   MEDICAL RECORD NO.:  [id number]          PATIENT TYPE:  INP   LOCATION:  2921                         FACILITY:  [facility]   PHYSICIAN:  Jill Alexanders, M.D.  DATE OF BIRTH:  1956/03/06   DATE OF ADMISSION:  08/17/2005  DATE OF DISCHARGE:                                HISTORY & PHYSICAL   HISTORY OF PRESENT ILLNESS:  Kurt Nelson is a 55 year old right-handed black  male with a history of hypertension that has been poorly controlled. The  patient has apparently been followed by a Dr. Tamala Julian in the [place] area.  He has been placed on blood pressure medications in the past but has not  been on the medications for some time. The patient apparently has had little  medical follow-up. The patient comes to the [facility] Emergency  Room today after he had noted onset of left-sided numbness and weakness that  began around 7 p.m. on the August 16, 2005. The patient was taken back at  that time, noted sudden onset of problems associated with headache. The  patient felt numbness on the face, arm and leg along with weakness. He had  difficulty getting out of the bath tub. The patient has had persistent  symptoms, slight worsening of symptoms in to the day. Again, the patient  denies headache, nausea, vomiting, loss of consciousness. The patient has  noted some slight slurred speech. Because [facility] Room  did not have a functional CT scanner, the patient was sent to [facility] for further evaluation.   CT scan of the head was done. It shows evidence of a right vasoganglion  intracranial hemorrhage with significant hypertension with diastolic blood  pressures in the 118 to 120 range. The patient appears to be alert,  cooperative at this point. Neurology was called for further evaluation. No  intraventricular extension of blood was noted. The patient claims he smokes  marijuana but does not do  any other drugs such as cocaine. He drinks a fifth  of alcohol a day. He smokes a pack of cigarettes a day.   PAST MEDICAL HISTORY:  1.  History of uncontrolled hypertension with medical noncompliance.  2.  Right vasoganglion intracranial hemorrhage without any intraventricular      extensions above. The patient denies any history of surgery in the past.  3.  No other medical problems.   MEDICATIONS:  The is on no medications.   ALLERGIES:  States no known allergies.   SOCIAL HISTORY:  Smokes a packs a cigarettes a day. Drinks a fifth of  alcohol daily. Smokes marijuana. The patient is separated. Has three  children who are alive and well. The patient does not work. Lives with a  girlfriend.   FAMILY HISTORY:  Notable that both parents passed away. Cause of death of  the father is unknown. Mother had history of hypertension. The patient has  two sisters who are alive and well. No brothers.   REVIEW OF SYSTEMS:  Notable for no recent fevers or chills. The patient  denies headache, neck stiffness. Denies shortness of breath, chest pain,  abdominal pain, nausea, vomiting. Denies any problems with dizziness or loss  of consciousness. The patient does note left-sided numbness and weakness as  above.   PHYSICAL EXAMINATION:  VITAL SIGNS:  Blood pressure is 178/117, heart rate  81, respiratory rate 14, temperature afebrile.  GENERAL:  This patient is a thin black male who is alert and oriented at the  time of examination. He wants to know when he can eat.  HEENT:  Head:  Atraumatic. Eyes:  Pupils are round and react to light. A  very dense cataract noted on the left, less dense cataract noted on the  right. Disk is flat on the right. Cannot visualize on the left.  NECK:  Supple. No carotid bruits noted.  LUNGS:  Clear.  CARDIOVASCULAR:  Regular rate and rhythm. No obvious murmurs or rubs noted.  ABDOMEN:  Positive bowel sounds. No organomegaly or tenderness noted.  EXTREMITIES:   Without significant edema.  NEUROLOGICAL:  Cranial nerves as above. The patient has minimal left facial  droop. Pinprick sensation of the left face is depressed compared to the  right. The patient has normal speech pattern. No aphasia. Again, extraocular  movements are relatively full. Visual fields are full. Motor testing reveals  4/5 strength in the left arm, left leg compared to the right. The patient  has good strength on the right side. The patient has decreased pinprick,  soft touch, vibratory sensation in the left arm and left leg compared to the  right. The patient has clumsiness with finger-nose-finger of the left upper  extremity and normal on the right. Clumsiness to toe-to-finger on the left  lower extremity which is normal on the right. Gait was not tested. Deep  tendon reflexes were relatively symmetric. Depression of ankle jerk reflexes  noted bilaterally. Toes are neutral bilaterally.   LABORATORY DATA:  Laboratory values at this time are pending. Chest x-ray  and EKG are pending. CT of the head as above. Some blood work has just  returned indicating a white count of 6.1, hemoglobin 13.2, hematocrit 37.9,  MCV of 86.7, platelets of 251,000. Again, the rest of the blood work is  pending.   IMPRESSION:  1.  Severe uncontrolled hypertension.  2.  Right vasoganglion hemorrhage likely secondary to hypertension.  3.  Medical noncompliance.  4.  Alcohol and tobacco abuse.   This patient will be admitted for observation and treatment. The patient has  mild left hemiparesis, hemisensory deficit at this point with significant  clumsiness on the left side. The primary goal initially will be to treat the  blood pressure and follow neurologic status. The patient apparently did  receive an aspirin through the emergency room. I will hold aspirin at this  point. The patient will be brought in for further management.   PLAN:  1.  Admission to [facility] 3100 stepdown  unit. 2.  Cardene drip with oral Klonopin.  3.  Bedside swallow evaluation.  4.  Thiamine.  5.  Urine drug screen.  6.  Repeat CT of the head in the a.m.  7.  Physical therapy and occupational therapy evaluation come Monday, two      days from now.   We will follow the patient's clinical course while in-house.      Jill Alexanders, M.D.  Electronically Signed     CKW/MEDQ  D:  08/17/2005  T:  08/19/2005  Job:  [id number]   cc:  [facility]  [address] [place] [address]

## 2011-01-18 NOTE — Discharge Summary (Signed)
Kurt Nelson, Kurt Nelson                  ACCOUNT NO.:  [id number]   MEDICAL RECORD NO.:  [id number]          PATIENT TYPE:  INP   LOCATION:  A204                          FACILITY:  [facility]   PHYSICIAN:  Vanetta Mulders. Dechurch, M.D.DATE OF BIRTH:  1956-03-17   DATE OF ADMISSION:  11/13/2005  DATE OF DISCHARGE:  03/15/2007LH                                 DISCHARGE SUMMARY   DISCHARGE DIAGNOSES:  1.  Seizure disorder.  2.  History of right parietal hemorrhagic cerebrovascular accident.  3.  Uncontrolled hypertension.  4.  History of substance abuse.  5.  Noncompliance with medical therapy.   DISPOSITION:  The patient is being discharged to home.   MEDICATIONS:  1.  Labetalol 600 mg b.i.d.  2.  Clonidine 0.1 mg b.i.d.  3.  Hydrochlorothiazide 12.5 daily.  4.  Norvasc 10 daily.  5.  Dilantin 300 mg at bedtime.   FOLLOW UP:  Follow up with Dr. Merlene Laughter in 2 weeks.  The patient is  encouraged to follow up with Dr. Tamala Julian in 2-3 weeks for blood pressure  management.  The patient received medication vouchers per [employer]   HOSPITAL COURSE:  A 55 year old gentleman with a history of a right parietal  hemorrhage in January 2007, associated with uncontrolled hypertension and  probable cocaine abuse who presented to the emergency room with what sounds  like a generalized seizure.  He was postictal initially, but returned to his  baseline status.  He had no new focal events.  CT scan was unremarkable for  new events.  Metabolically, he was intact with the exception of some  decreased potassium which was supplemented.  The patient's blood pressure  initially was uncontrolled.  He was started back on his previous blood  pressure regimen and actually had reasonable control in the brief time in  the hospital.  He was seen in consultation by neurology who recommended  Dilantin prophylaxis.  He is being discharged to home with a plan as noted  above.  At the time of discharge, he is awake, alert  and baseline mental  status, insignificant left upper extremity weakness, but otherwise normal  exam.  He has a lichenified rash in his groin consistent with chronic tinea  cruris and he was given Lotrisone here in the hospital and sent home with a  tube  for further management.  He was seen in consultation by Social Services to  assist him with obtaining some assistance regarding his medical regimen.  He  is encouraged, should he not be able to afford is medications, to be sure to  let his primary care physician know or to follow up with social services.      Vanetta Mulders Hillery Jacks, M.D.  Electronically Signed     FED/MEDQ  D:  11/14/2005  T:  11/15/2005  Job:  [id number]   cc:   Vernon Prey. Tamala Julian, M.D.  Fax: [place]. Merlene Laughter, M.D.  Fax: [phone]

## 2011-01-18 NOTE — H&P (Signed)
NAMEJARRATT, Kurt Nelson                  ACCOUNT NO.:  [id number]   MEDICAL RECORD NO.:  [id number]          PATIENT TYPE:  INP   LOCATION:  2102                         FACILITY:  [place]   PHYSICIAN:  Alyson Locket. Love, M.D.    DATE OF BIRTH:  11/28/1955   DATE OF ADMISSION:  09/22/2005  DATE OF DISCHARGE:                                HISTORY & PHYSICAL   This is the second [facility] admission for this 55 year old right-  handed black male transferred intubated and sedated from [facility] ER for combative behavior with hypertension, abnormal EKG, and  positive cocaine screen associated with left parietal hematoma.   HISTORY OF PRESENT ILLNESS:  Mr. Brashear has a known history of hypertension,  non medical compliance, and cocaine use.  He was admitted to [facility] from December 16 through August 27, 2005, with right basal  ganglia 2 cm x 2 cm intracranial hemorrhage.  This was associated with left  hemiparesis and left hemisensory loss.  At that time, he had presented to  [facility] and a positive urine screen for cocaine.  His  blood pressure was in the 123456 to 123456 diastolic range.  An EKG was abnormal  showing normal sinus rhythm, biatrial enlargement, left ventricular  hypertrophy and early repolarization abnormalities with prolonged QT.  After  his hospitalization at [facility], he was discharged on three  antihypertensives and Matinex. He returned to [facility]  on September 22, 2005, having been noted to have seizure-like activity and  combative behavior.  In the emergency room, his blood pressure was 236/146,  he was sedated, intubated, and placed on Diprivan IV.  His blood pressure  was very high and unresponsive to 40 mg and 80 mg IV labetalol push and then  1 mg per minute and he was started on Cardene IV with drop in blood pressure  in the 130/80 range.  He was considered initially a candidate for  Nova-7-A,  but EKG abnormalities were noted and he had a CK total of 190, CK-MB of 5.2,  relative index of 2.7 which was high, and an elevated troponin of 0.08.  Because of this, Nova-7 was held.  He had a positive drug screen for cocaine  and he was transferred to [facility] by [facility] on Diprivan and  IV Cardene.   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   MEDICATIONS:  Medications when discharged from the hospital in December of  2006 were:  1.  Norvasc 5 mg daily.  2.  Clonidine 0.2 mg t.i.d.  3.  Labetalol 200 mg q.8 hours.  4.  Matinex daily.  5.  Multivitamin daily.   Whether he took these is unknown.   Medications received at [facility] prior to transfer were:  1.  Ativan 2 mg IV x3 doses.  2.  Succinylcholine 125 mg IV.  3.  Labetalol 40 mg IV, 80 mg IV, and then 1 mg per minute.  4.  Diprivan 5 mcg per minute, changed to 10 mcg per minute.  5.  Pamelor 10 mg IV.  6.  Cardene 5 mg IV per hour.   Details of his history otherwise are unknown.   PHYSICAL EXAMINATION:  GENERAL:  A well-developed black male intubated, on a  respirator.  VITAL SIGNS:  Blood pressure right and left arm 160/100, heart rate 76,  respiratory rate 12, temperature 98.4 degrees rectally.  He had no bruits.  MENTAL STATUS:  His eyes were closed.  He moved all extremities  spontaneously.  He did not follow commands.  His cranial nerve examination  revealed scarring in the left lens.  His right disc was flat.  He did not  blink to scare.  Corneals were present.  There was no definite facial motor  asymmetry.  He did not grimace.  His hearing was questionable.  His vision  was questionable.  He had decreased gags.  Motor examination revealed  increased tone left arm and left leg.  He  moved all extremities.  Sensory  examination was unknown.  He did have some movement of his legs to pain.  He  had increased deep tendon reflexes on the left as compared to the right  including the left arm and the left leg.  Plantar responses were bilaterally  downgoing.  HEENT:  Tympanic membranes were clear.  HEART:  No murmurs.  ABDOMEN:  Bowel sounds were normal.  LUNGS:  He had rhonchi in both lung fields.  SKIN:  He had burns or skin rash over his pelvic region bilaterally.   LABORATORY DATA:  CT scan showed evidence of previous right basal ganglia  hemorrhage with area of edema in the right basal ganglia and thalamic  region.  He had evidence of bicerebral small vessel ischemic strokes  including the left cerebellum.  He also had evidence of a 1.7 x 1.3 cm  hemorrhage in the high left parietal region near the cortex.   His EKG showed left ventricular hypertrophy, ST T wave changes,  anterolateral ischemia.   White blood cell count 8300, hemoglobin 14.0, hematocrit 42.2, and platelets  238,000.  Sodium 139, potassium 3.7, chloride 108, CO2 content 21, BUN 26,  creatinine elevated at 2.3, glucose was 110.  His alcohol level was  negative, liver function tests were normal.  Drug screen was positive for  cocaine.  Urinalysis was negative.  PTT was 28, pro-time was 0.9.  He had  two chest x-rays which may have shown some mild enlargement of the heart.  The next one showed the tube was down too far and it had to be pulled back.  His muscle enzymes are as listed above.   IMPRESSION:  1.  Left parietal hemorrhage.  431  2.  Cocaine use.  305.61  3.  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  431  4.  Hypertension.  796.2  5.  Left hemiparesis.  342.10  6.  Bicerebral small vessel disease strokes on CT scan.  433.31  7.  Questionable history of alcohol use. 8.  History of chronic renal failure.   PLAN:  Admit the patient, obtain a repeat  CT scan, place him on Diprivan and  try to control his blood pressure with medicines through a [facility].  ______________________________  Alyson Locket. Erling Cruz, M.D.     JML/MEDQ  D:  09/22/2005  T:  09/23/2005  Job:  [id number]   cc:   Shaune Pollack, M.D.  Fax: [phone]

## 2011-01-18 NOTE — Discharge Summary (Signed)
NAMEVASHON, MARSICO                  ACCOUNT NO.:  [id number]   MEDICAL RECORD NO.:  [id number]          PATIENT TYPE:  INP   LOCATION:  3031                         FACILITY:  [place]   PHYSICIAN:  Jill Alexanders, M.D.  DATE OF BIRTH:  [birth year]   DATE OF ADMISSION:  08/17/2005  DATE OF DISCHARGE:  08/27/2005                                 DISCHARGE SUMMARY   ADMISSION DIAGNOSES:  1.  Hypertension with medical noncompliance.  2.  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  3.  Alcohol abuse.  4.  Cocaine abuse.   DISCHARGE DIAGNOSES:  1.  Severe hypertension.  2.  Right basal ganglial/thalamic intracranial hemorrhage.  3.  History of alcohol and cocaine abuse.   PROCEDURES THIS ADMISSION:  CT of the head.   COMPLICATIONS:  None.   HISTORY OF PRESENT ILLNESS:  Kurt Nelson is a 55 year old right-handed black  male born [birth year] with a history of severe hypertension that has been  untreated.  The patient has been placed on blood pressure medications but  has failed to follow-up with his physician and has failed to continue his  blood pressure medications.  In addition to this, the patient has been  drinking heavily and has been using cocaine.  The patient on the evening  prior to this admission noted the onset of left-sided numbness and weakness  and headache.  The patient did not seek medical attention immediately but  came into [facility] the day after onset of the symptoms.  [facility] did not have a functional CT scanner in place and he was  transferred to [facility] for further evaluation.  CT of the head  done at [facility] shows a right basal ganglial/thalamic  intracranial hemorrhage without interventricular extension, with diastolic  blood pressures in the 118 to 120 range.  The patient was alert and  cooperative.   PAST MEDICAL HISTORY:  Significant for  1.   Uncontrolled hypertension with medical noncompliance.  2.  Right basal ganglial intracranial hemorrhage without interventricular      extension.  3.  History of alcohol and cocaine abuse.  4.  History of marijuana use.  The patient is on no medications prior to      admission, states no known allergies.  He smokes a pack of cigarettes a      day, was drinking a fifth of alcohol a day, smokes marijuana, and uses      cocaine.   Please refer to the history and physical for the patient's social history,  family history, review of systems,  physical examination.   LABORATORY DATA:  Laboratory values notable for sodium 139, potassium 4.6,  chloride 109, CO2 26, glucose 115, BUN of 20, creatinine 1.9, calcium 9.4.  White count 5.0, hemoglobin 12.1, hematocrit 35.9, MCV 7.8, platelets 259,  calcium 9.0.  Homocysteine level was 21.7.  Cholesterol level 199.  Triglycerides 127, HDL 45, VLDL 25, LDL 129.   EKG reveals normal sinus rhythm with bi-atrial enlargement, left  ventricular  hypertrophy with repolarization abnormality with prolonged Q-T interval,  heart rate of 89.   Chest x-ray shows no active cardiopulmonary disease.   HOSPITAL COURSE:  This patient has done well during the course of  hospitalization.  The patient has been initially to the neuroscience  intensive care unit. The patient was noted to have a low potassium level  requiring supplementation, also has had elevated BUN and creatinine likely  associated with significant untreated hypertension.  The patient has been  running creatinines from 1.5 to 1.9 range during this admission.  Homocysteine level was noted to be elevated, and the patient was treated  with Metanx.  The patient was felt to be at risk for DT's.  Blood pressures  initially were quite elevated, and the patient required treatment with  Cardene drip, clonidine was used.  The patient was seen by physical,  occupational, and speech therapy.  He was felt to be able  to take a p.o.  diet after evaluation.  The patient has been taken off Lasix that was added  initially and labetalol was added.  Phenobarbital was used as alcohol  withdrawal protocol was the patient began getting agitated by the 20th of  December, 2006.  The patient seemed to respond to this fairly well.  The  patient was seen by the rehab physician service and felt to be a good  candidate for inpatient rehab.  The patient has continued to have somewhat  elevated blood pressures with diastolics in the upper 123XX123 to 100 range.  The  patient is on labetalol, oral clonidine, is off diuretics, and ACE  inhibitors not to be used due to chronic renal insufficiency.  Norvasc will  be added at this point.  Blood pressures will need to be monitored.  CT  evaluations of the brain showed stable intracranial hemorrhage without  interventricular extension.  At this time, the patient is bright, alert,  cooperative, has gone through the DT's, is stable at this point.  The  patient does have a mild left hemiparesis but mainly has a sensory ataxia on  the left.  The patient is ambulatory with minimal assistance, has positive  Romberg.  Speech is well enunciated and not aphasic.  The patient has been  swallowing and eating well.   DISCHARGE MEDICATIONS:  1.  Norvasc 5 mg daily.  2.  Clonidine 0.2 mg three times a day.  3.  Labetalol 200 mg q.8h.  4.  Metanx tablets daily.  5.  Multivitamins daily.   The patient did receive IV thiamine early on in the hospitalization.  Tylenol is given if needed.  The patient can be transferred at this point  once an inpatient rehab bed is available.      Jill Alexanders, M.D.  Electronically Signed     CKW/MEDQ  D:  08/26/2005  T:  08/27/2005  Job:  [id number]   cc:   [facility]  [address] [place]  [address]   Inpatient Rehab Service   Dr. Lavone Orn area

## 2011-01-18 NOTE — Discharge Summary (Signed)
Kurt Nelson, Kurt Nelson                  ACCOUNT NO.:  [id number]   MEDICAL RECORD NO.:  [id number]          PATIENT TYPE:  INP   LOCATION:  3031                         FACILITY:  [facility]   PHYSICIAN:  Princess Bruins. Hickling, M.D.DATE OF BIRTH:  Dec 25, 1955   DATE OF ADMISSION:  08/17/2005  DATE OF DISCHARGE:  08/27/2005                                 DISCHARGE SUMMARY   FINAL DIAGNOSES:  1.  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  2.  Alcohol, tobacco, and cocaine abuse.   PROCEDURES:  1.  CT scan of the brain.  2.  MRI of the brain.   COMPLICATIONS:  None.   SUMMARY OF THE HOSPITALIZATION:  The patient is a 55 year old who presented  with sudden onset of left sided weakness and numbness associated with a  headache.  The patient was noted to have a CT scan of the brain that showed  intracranial hemorrhage within the right basal ganglia region and diastolic  blood pressures in the 118 to 120 range.  The patient was admitted to [facility], placed on a Cardene drip.  He required blood pressure support  for much of the hospitalization.  The patient made slow steady progress.   PHYSICAL EXAMINATION:  VITAL SIGNS:  At the time of this dictation, he has a  blood pressure of 137/81.  Temperature 98.3.  Resting pulse 70.  Respirations 20.  Oxygen saturation 100%.  LUNGS:  Clear.  HEART:  No murmurs.  GENERAL:  The patient is pleasant without dysphagia.  NEUROLOGIC:  Cranial nerves:  Round reactive pupils.  Visual fields full.  Extraocular movements full.  Mild left central seventh paresis.  Motor  examination:  Left hemiparesis with 4+/5 strength.  Arm much more involved  than the leg.  The patient has drift and clumsy fine motor movements.  The  patient has a left hemiparetic gait which is improved.  He does much better  and is more stable with a rolling walker than walking alone.   RADIOLOGIC RESULTS:  Head CT scan of the brain, on  August 22, 2005, showed  a persistently stable right thalamic hemorrhage with old lacunar infarctions  and small vessel white matter ischemic changes and frontal sinus mucosal  thickening.  Rather than MRI, the patient actually had three CT scans on  December 12, 18, and 21, all stable.  The size of the lesion was about 2-cm  in diameter.   EKG showed a sinus rhythm, biatrial enlargement, left ventricular  hypertrophy with repolarization abnormality, prolonged QT interval.   LABORATORY STUDIES:  Basic metabolic panel:  Sodium XX123456, potassium 4.6,  chloride 109, CO2 26, carbon dioxide 115, BUN 20, creatinine 1.9, calcium  9.7.  The patient has moderate azotemia.  The patient's creatinine ranged  from a low of 1.5 to a high of 2.0 during the hospitalization.  Potassium  was 2.8 on admission and has been stable since that time, although he has  received supplemental potassium.  I question whether he needs it.  Serum  homocystine  21.7.  Lipid profile:  Cholesterol 199, HDL cholesterol 45, VLDL  cholesterol 25, LDL cholesterol elevated at 129.  His triglycerides 127.  White count 5,000, hemoglobin 12.1, hematocrit 35.9, MCV 87.8, platelet  count 259,000.   The patient was seen by rehabilitation services today who believed that the  patient is functioning on a high enough level that he does not require  comprehensive inpatient rehabilitation.  Consequently, we will work for  outpatient physical and occupational therapy, a rolling walker, medication  assistance.   DISCHARGE MEDICATIONS:  1.  Amlodipine 5 mg one daily.  2.  Labetalol 200 mg one three times daily.  3.  Clonidine 0.1 mg one twice daily.  4.  Metanyx one daily.   Prescriptions have been filled out in duplicate hoping for patient  assistance.   1.  He will return to see Dr. Floyde Parkins in six weeks' time.  2.  We are going to try to get him seen by [facility], however,      I think that he may live in the  [place] area and therefore, this will      not be available to him.   I appreciate the opportunity to participate in his care.      Princess Bruins. Gaynell Face, M.D.  Electronically Signed     WHH/MEDQ  D:  08/27/2005  T:  08/27/2005  Job:  [id number]   cc:   Jill Alexanders, M.D.  Fax: [phone]   [employer]

## 2011-01-18 NOTE — Consult Note (Signed)
Kurt Nelson, Kurt Nelson                  ACCOUNT NO.:  [id number]   MEDICAL RECORD NO.:  [id number]          PATIENT TYPE:  INP   LOCATION:  A204                          FACILITY:  [facility]   PHYSICIAN:  Kofi A. Merlene Laughter, M.D. DATE OF BIRTH:  [birth year]   DATE OF CONSULTATION:  11/14/2005  DATE OF DISCHARGE:                                   CONSULTATION   REASON FOR CONSULTATION:  Seizure.   HISTORY:  This is a 55 year old African-American man who has a history of  noncompliance, hypertension and cerebral hemorrhage in the past. The patient  presented to the hospital after having a generalized tonic/clonic seizure  with post-ictal state and confusion. The patient is back to baseline. He was  most recently admitted to [facility] where he saw the [facility] for a right parietal hemorrhage and chest pain. The patient  reports to be compliant with his medication and has not used any illicit  drugs since December of 2006.   PAST MEDICAL HISTORY:  1.  Hypertension.  2.  Noncompliance.  3.  Cocaine abuse.  4.  Bihemispheric small-vessel ischemic disease.  5.  Basal ganglion hemorrhage on the right.  6.  Alcohol use.  7.  Right parietal hemorrhage and left parietal hemorrhage.   ADMISSION MEDICATIONS:  1.  Norvasc 10 mg daily.  2.  Hydrochlorothiazide.   SOCIAL HISTORY:  History of cocaine and alcohol use although has not used  any since late 2006.   REVIEW OF SYSTEMS:  As stated in history of present illness. Otherwise  unrevealing.   PHYSICAL EXAMINATION:  GENERAL:  This is a thin, pleasant male. He was in no  acute distress.  HEENT:  Shows that neck is supple. Head is normocephalic, atraumatic.  ABDOMEN:  Soft.  EXTREMITIES:  No significant varicosities or edema.  VITAL SIGNS:  Temperature 97.3, pulse 87, respirations 20, blood pressure  147/99.  MENTATION:  The patient is awake and converses well. Speech is normal.  Cognition and language are also  unrevealing. There is no language impairment  or dysarthria noted. He follows commands well. Cranial nerves evaluation,  seems to be cataract involving the left eye. Pupils are reactive. Visual  fields are intact. Extraocular movements are intact. Fascial muscle strength  is symmetric. Tongue is midline. Uvula is midline. Shoulder shrugs are  normal. Motor examination shows normal tone, bulk and strength. There is no  pronator drift. Coordinator indicates significant dysmetria of the left  upper extremity. Reflexes are somewhat brisk in the legs although they are  normal in the upper extremities. Plantar reflexes are both downgoing.  Sensation is normal to light touch and temperature.   CT scan of brain shows significant hypodensities involving the right  thalamic and basal ganglia regions. There is also hyperdensities noted in  the left parietal area and right parietal area. The scan is compared to the  previous MRI, and the hypodensities are in the same location of previous  hemorrhages. The MRI scan done several weeks ago, there is hyper intensity  noted on flare involving the right  parietal area and left cerebellar  regions. There is also on flare hyperintense lesion noted in the thalamic  and basal ganglia region on the right side. There is a hypodense region  noted on the left parietal region which abuts the cortex.   ASSESSMENT:  1.  Single seizure. Given the known history of cerebral hemorrhage, the      patient likely has a risk of recurrence, especially given the left      parietal hemorrhage which appears to approach the cortex.  2.  Hypertension. Poorly controlled with presentation of blood pressure of      202/110.  3.  Noncompliance.  4.  Alcohol abuse.  5.  Cocaine abuse.   RECOMMENDATIONS:  1.  Since compliance is an issue, I think we should stick to a once a day      dosing medication. Therefore, it is recommended Dilantin brand name only      300 mg at bedtime and  titrate as needed.  2.  EGD should be done at some point in time. We will arrange this at a      later date in our office.  3.  We again discussed with the patient history of compliance and avoiding      illicit drug use.      Kofi A. Merlene Laughter, M.D.  Electronically Signed     KAD/MEDQ  D:  11/14/2005  T:  11/14/2005  Job:  [id number]

## 2011-01-18 NOTE — Consult Note (Signed)
Kurt Nelson, BRO                  ACCOUNT NO.:  [id number]   MEDICAL RECORD NO.:  [id number]          PATIENT TYPE:  INP   LOCATION:  2102                         FACILITY:  [facility]   PHYSICIAN:  Jacqulyn Ducking, M.D. [facility] OF BIRTH:  [birth year]   DATE OF CONSULTATION:  09/24/2005  DATE OF DISCHARGE:                                   CONSULTATION   REFERRING PHYSICIAN:  Dr. Erling Cruz.   PRIMARY CARE PHYSICIAN:  Dr. Irving Shows.   HISTORY OF PRESENT ILLNESS:  A 55 year old gentleman admitted following a  seizure and treatment for hypertensive crisis. Referred for evaluation of  abnormal troponin. Mr. Kurt Nelson has a long history of severe hypertension with  medical noncompliance. As a result, he has cerebral small vessel disease  with ischemic strokes and a prior right basal ganglia hemorrhage. He  presented to [facility] two days ago with apparent seizure activity  and was subsequently confused and combative. Initial blood pressure was  235/145. With intravenous therapy, blood pressure and mental status have  improved substantially. A drug screen was positive for cocaine, which has  been the case in the past. Initial cardiac markers have shown normal CPK-MB  but minimally elevated troponin with a peak value of 0.13.   Past medical history is not otherwise unremarkable. The patient reports an  allergy to PENICILLIN. He has never apparently previously seen a  cardiologist nor undergone any significant cardiac testing.   MEDICATIONS:  Prior to admission were prescribed to include amlodipine 5  milligrams q.d., clonidine 0.2 milligrams t.i.d., labetalol 200 milligrams  t.i.d. and a multivitamin.   SOCIAL HISTORY:  Lives in [place] with a girlfriend; unemployed-  previously worked in Paediatric nurse. Smokes one packs of cigarettes  per day with a 35-pack year total consumption. Excessive use of alcohol  averaging approximately 8 ounces per day. Diet is poor. Exercise is  limited.   FAMILY HISTORY:  Mother died with hypertension and a CVA; information on  father is limited; two sisters, one with hypertension.   REVIEW OF SYSTEMS:  Difficult to obtain. The patient reports some chronic  nasal discharge. He has had urinary frequency. He reports residual left-  sided weakness from his prior CVA. There has been some dysphasia and a  history of GERD.   EXAM:  GENERAL:  Somewhat lethargic, low-keyed gentleman in no acute  distress.  VITAL SIGNS:  The temperature is 98.1, heart rate 80 and regular,  respirations 15, blood pressure 175/90, O2 saturation 96% on 3 liters.  HEENT:  Bilateral myosis; fundi could not be visualized; EOMs full; sclerae  somewhat muddy.  NECK:  Mild jugular venous distension; normal carotid upstrokes without  bruits.  ENDOCRINE:  No thyromegaly.  SKIN:  No significant lesions.  LUNGS:  Clear laterally; patient is restrained preventing optimal exam.  CARDIAC:  Normal first heart sounds; increased intensity in second heart  sounds; modest systolic ejection murmur; PMI laterally displaced and  sustained.  ABDOMEN:  Soft and nontender; normal bowel sounds; aortic pulsation not  palpable; no organomegaly; no masses.  EXTREMITIES:  Normal distal  pulses; no edema.  NEUROMUSCULAR:  Symmetric strength and tone; normal cranial nerves.  MUSCULOSKELETAL:  No joint deformities.   Chest x-ray:  Bilateral pulmonary edema; atelectasis at the bases. EKG:  Normal sinus rhythm; marked left atrial enlargement; marked LVH with  repolarization abnormality and increased QRS duration. Other laboratory  notable for hemoglobin 10.6, potassium 3.4, initial creatinine of 2.3 and  improving to 1.7. LFT's were normal. A blood gas was normal. Lipids showed  total cholesterol 175, triglycerides 188, HDL 37 and LDL of 100.   IMPRESSION:  Mr. Kurt Nelson presents with multiple possible causes for troponin  elevation, none of them necessarily indicating coronary artery  disease.  Cocaine could have caused coronary spasm and mild myocardial necrosis. A  markedly elevated blood pressure alone will cause elevated troponins,  especially in the setting of congestive heart failure. He had moderate renal  insufficiency at presentation, another possible cause. A TSH will be  obtained to rule out thyroid abnormalities. An echocardiogram will be  obtained to further evaluate hypertensive heart disease. His minimal  troponin elevation is not necessarily indicative of coronary artery disease  and does not necessarily warrant further testing for same.   Most important is control of hypertension. He is taking both an oral and  intravenous calcium channel blocker-the latter can be discontinued. Addition  of a diuretic would be appropriate, especially if creatinine returns to a  near normal value. Our goal ultimately would be medical compliance with  discontinuation of excessive use of alcohol, use of tobacco products and use  of cocaine.   We appreciate the request for consultation and will be happy to follow this  gentleman with you.      Jacqulyn Ducking, M.D. [facility]  Electronically Signed     RR/MEDQ  D:  09/24/2005  T:  09/24/2005  Job:  [id number]

## 2011-01-18 NOTE — Discharge Summary (Signed)
NAMETRAYDEN, REVEAL                  ACCOUNT NO.:  [id number]   MEDICAL RECORD NO.:  [id number]          PATIENT TYPE:  INP   LOCATION:  [id number]                         FACILITY:  [facility]   PHYSICIAN:  Pramod P. Leonie Man, MD    DATE OF BIRTH:  08-21-1956   DATE OF ADMISSION:  09/22/2005  DATE OF DISCHARGE:  09/30/2005                                 DISCHARGE SUMMARY   DIAGNOSIS AT TIME OF DISCHARGE:  1.  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  2.  History of right vasoganglion hemorrhage December 2006 secondary to      hypertension and cocaine vasculitis.  3.  Independent respiratory failure secondary to intracranial hemorrhage on      admission September 22, 2005, now resolved.  4.  Hypertension, difficult to control.  5.  Cocaine use.  6.  Renal insufficiency, question renal artery stenosis.  7.  Alcohol use.  8.  Medical noncompliance.   MEDICINES AT TIME OF DISCHARGE:  1.  Hydrochlorothiazide 12.5 mg a day.  2.  Clonidine patch 0.1 mg per 24 hours q.7d.  3.  Labetalol 600 mg t.i.d.  4.  Norvasc 10 mg a day.  5.  Multivitamin a day.   STUDIES PERFORMED:  1.  Initial CT of the head done at [facility] shows a previous right      vasoganglion hemorrhage with area of edema in the vasoganglion and      thalamic region.  Evidence of bicerebral small-vessel ischemic strokes      there and including the left cerebellum.  Evidence of a 1.7 x 1.3 cm      left parietal region hemorrhage high near the cortex.  2.  Follow up CT at 24 hours shows no interval change in hemorrhage.  There      is evolution of right thalamic hemorrhage, underlying mass is not      excluded..  3.  MRI of the brain shows early chronic blood products in right thalamus      primarily extracellular methemoglobin with surrounding hemosiderin,      extensive edema within the thalamus extending into the white matter      tracts.  No enhancement to suggest additional mass lesion.   Subacute      hemorrhage left parietal lobe with primarily deoxyhemoglobin blood      products.  Multiple additional punctate areas of likely remote      hemorrhage scattered throughout the brain particularly the vasoganglia.      This suggests vasculopathy associated with patient's chronic cocaine use      and known hypertension.  Acute infarct left cerebellum.  Subacute      infarct posterior right parietal lobe.  Extensive paranasal sinus      disease with air fluid levels in most sphenoid sinuses, as well as left      maxillary sinus.  4.  Multiple chest x-rays with no acute disease verifying tube placements.      Also, abdominal x-rays verifying Panda tube placement.  There was an MRA  also of the abdomen, which was negative for renal artery stenosis, a 2.6      cm secular infrarenal abdominal aortic aneurysm with continued      surveillance recommended.  5.  Ultrasound of the kidneys was normal.  6.  EKG showed normal sinus rhythm with biatrial enlargement, left      ventricular hypertrophy with repolarization abnormality.  7.  Carotid Doppler was normal.  8.  A 2-D echocardiogram shows left ventricular function normal, cannot rule      out mild hypokinesis of the entire anterior wall.  Moderate right      ventricular hypertrophy, left atrial size upper limits of normal.  There      is a slight speckled pattern to LV walls to consider infiltrative      process.   LABORATORY STUDIES:  Hemoglobin 10.6, red blood cells 3.65, otherwise  normal.  Chemistry with creatinine ranging from 1.7 to 2.4 during  hospitalization, glucose ranging 105 to 134 during hospitalization, BUN  ranging 9 to 32, rest of chemistry was normal.  Liver function tests were  normal.  Homocystine high at 15.8.  Cardiac enzymes:  CK, CK-MB and relative  index normal, troponin-I slightly elevated at 0.13.  Lipid profile with  cholesterol of 175, triglycerides 188, HDL 37, LDL 101, the LDL 38  __________ ,  aldosterone pending, TSH 2.591.  Urine metanephrines pending,  urine catecholamines pending.  Bronchial lavage culture with greater than  100,000 colonies of nonpathogenic oropharyngeal type flora.   HISTORY OF PRESENT ILLNESS:  This is the second [facility]  admission for a 55 year old right-handed black male, who was transferred  intubated and sedated from [facility] for combative behavior with  hypertension, abnormal EKG and positive cocaine screen associated with left  parietal hematoma.  Mr. Mastro has a known history of hypertension, non-  medical compliance and cocaine use.  He was admitted to [facility]  from December 16 through August 27, 2005 with vasoganglion 2 x 2 cm  intracranial hemorrhage.  This hemorrhage was associated with left  hemiparesis and left hemi-sensory loss.  At that time, he had present to  [facility] with a positive urine drug screen for cocaine.  His blood pressure was 118 to 123456 in the diastolic range.  An EKG was  abnormal showing normal sinus rhythm, biatrial enlargement, left ventricular  hypertrophy and early repolarization abnormalities with prolonged QT.  After  his hospitalization at [facility], he was discharged on three anti-  hypertensives and Metanx.  He returned to [facility] on January 21  having been noted to have seizure like activity and combative behavior.  In  the emergency room, his blood pressure was 236/46.  He was sedated,  intubated and placed on Diprivan drip.  His blood pressure was unresponsive  labetalol pushed initially and was started on Cardene drip, which rapidly  lowered his blood pressure to an acceptable range.  He was initially  considered a candidate for Nova-7, but EKG abnormalities were noted and he  had a CK total of 190 and an elevated troponin of 0.08.  Because of this,  Nova-7 was held.  He had a positive drug screen for cocaine.  He was transferred to [facility] by [place] on Diprivan and arm IV  Cardene.  He was admitted for further stroke workup.   HOSPITAL COURSE:  The patient, neurologically, had no need for intubation  and was weaned off the  ventilator.  His home blood pressure medicine regime  was restarted.  Of note, the patient had been noncompliant with the regime  and had not been taking medicines prior to admission.  Cardiology was  consulted for their opinion related to blood pressure management.  His blood  pressure was refractory during the hospitalization requiring labetalol and  hydralazine pushes at times.  Final recommendations for blood pressure  control from Dr. Lattie Haw included elevated creatinine is very likely  secondary to ACE inhibitor and he agrees with discontinuing Altace.  It is  felt the hydrochlorothiazide did not add to dehydration and recommended  restarting at 12-1/2 mg a day when the creatinine is stabilized.  He feels  clonidine is much better tolerated transdermally, so the patch was resumed  in hospital.   After initial wean, the patient had difficulty with agitation and confusion  requiring Haldol and Ativan and restraints.  Approximately five days into  hospitalization this began to improve and restraints were gradually removed,  as well as sedating medicines.  He was placed on Seroquel during the  hospitalization to help with these agitated episodes, but was not discharged  on these medicines.   The patient has his swallowing evaluated during the hospital and he was  found to be able to tolerate a regular diet with thin made foods.  He was  seen by PT and OT in hospital walking 300 feet x2 including turns, forward,  backwards and sudden stops.  He was felt safe to return home and patient  plans to return home with his girlfriend.  The patient was advised to stop  drinking and using cocaine.  He was encouraged to take his medicines as  prescribed.  He has a primary care physician in [place]  and he was  encouraged to follow up with him.  There was discussion of TEE, but this was  felt not to be necessary secondary to known etiology of infarcts.   CONDITION AT DISCHARGE:  The patient's alert and oriented x3.  Speech  essentially clear though patient edentulous.  He follows complex commands.  His face is symmetric.  His visual fields are full.  His right strength is  normal.  He has a mild left hemiparesis at 4/5 in his arm and his leg.  His  gait steady except occasionally with turns.   DISCHARGE PLAN:  1.  Discharge home with girlfriend.  2.  Follow up with primary care physician, Dr. Tamala Julian in [place].  We will      ask Dr. Tamala Julian to manage risk factors including blood pressure control      and follow up renal insufficiency.  3.  Follow up with Dr. Clydene Fake office with Burnetta Sabin, Nurse Practitioner      in one month.  4.  Stop using drugs, alcohol and smoking.  5.  Take medicines as prescribed.      Burnetta Sabin, N.P.   ______________________________  Kathie Rhodes. Leonie Man, MD    SB/MEDQ  D:  09/30/2005  T:  10/01/2005  Job:  [id number]   cc:   Pramod P. Leonie Man, MD  Fax: [place] Tamala Julian, M.D.  Fax: [phone]   Jacqulyn Ducking, M.D. [facility]  [address]  [place] [address]

## 2011-01-18 NOTE — H&P (Signed)
Kurt Nelson, ROBUSTELLI                  ACCOUNT NO.:  [id number]   MEDICAL RECORD NO.:  [id number]          PATIENT TYPE:  INP   LOCATION:  A204                          FACILITY:  [facility]   PHYSICIAN:  Vanetta Mulders. Dechurch, M.D.DATE OF BIRTH:  16-Jan-1956   DATE OF ADMISSION:  11/13/2005  DATE OF DISCHARGE:  03/15/2007LH                                HISTORY & PHYSICAL   The patient is a 55 year old African-American gentleman with a past medical  history of a left parietal hemorrhage and right basal ganglion hemorrhage in  January 2007 and the basal ganglion hemorrhage on December 2006, associated  with cocaine use and abuse. He also has a history of hypertension which is  difficult to control with an extensive workup at [facility] in  January, including a secondary hypertension workup, chronic renal  insufficiency with a creatinine of about 2 and medical noncompliance, who  presents today after having a seizure while sitting on the porch with his  significant other. He does not recall any issues of the event, just knew  that he felt funny and woke up in the emergency room. Upon presentation, he  was neurologically intact, although his blood pressure was uncontrolled with  diastolic's initially of [id number].  He has had multiple doses of Lopressor and  some labetalol, and his diastolic now is about [id number] with a systolic of [id number].   At the time of discharge September 30, 2005, he supposedly was on  hydrochlorothiazide 12.5 daily, clonidine 0.1 every 7 days, labetalol 600 mg  t.i.d. and Norvasc 10 daily and multivitamin daily.   He states he took his medications and brings the bottles with him, which  includes Norvasc 10 daily and hydrochlorothiazide 12.5 daily, both of which  are empty.  He states he could not take anymore medications secondary to  financial issues. The patient is being admitted to the hospital for further  evaluation and management.   The patient is alert and appropriate. He  is able to give reasonable history.   REVIEW OF SYSTEMS:  No headache, no shortness of breath, chest pain. He  claims he has been abstinent from alcohol, smokes an occasional cigar and  denies any drug use since his presentation in January.  He states he has not  had any seizures in the past, but review of the records, apparently he had  some seizure activity associated with his second stroke. The patient also  takes aspirin on an occasional basis.  No GI or GU complaints and denies any  rectal dysfunction essentially unremarkable.  Essentially unremarkable  review of systems.  He states history of his left-sided weakness has  returned to baseline.   SOCIAL HISTORY:  The patient, as noted above, denies any further drug use or  alcohol use. He lives with his significant other. 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   ALLERGIES:  HE IS ALLERGIC TO PENICILLIN.  HE THINKS HE GETS A RASH.   HIS FAMILY MEDICAL HISTORY:  Pertinent for a stroke  in his mother at age 72;  he has two sisters, who are alive and well. He has one daughter, age 37, who  is alive and well.   PHYSICAL EXAMINATION:  GENERAL:  Physical exam reveals a thin gentleman.  VITAL SIGNS:  Blood pressure is currently 170/105, pulse in the 70s and  regular, respirations are unlabored and O2 saturation is 98% on 2 liters.  CHEST:  His lungs are clear throughout.  CARDIOVASCULAR:  His heart is regular.  There is a soft systolic murmur at  the left sternal border but no gallop was present.  ABDOMEN:  Flat, soft and  nontender. I cannot appreciate any bruits.  EXTREMITIES:  No clubbing, cyanosis or edema. He has good range of motion,  good strength bilaterally without detectable difference.  HEENT:  His teeth are in fair-in-to-poor repair, no oropharyngeal lesions  are noted.  NEUROLOGIC:  His cranial nerves are intact.   ASSESSMENT/PLAN:  1.  Seizure in the  setting of a patient with known recent hemorrhagic stroke      associated with cocaine. His CT this evening does not reveal any acute      hemorrhage, just resolving or evolving changes. Question is whether he      needs to be on prophylactic medication.  I think that probably is      indicated. We will have neurology consult again.  The issue is going to      be affordability and compliance.  2.  Uncontrolled hypertension. We will resume his medications of labetalol,      Norvasc, hydrochlorothiazide, and clonidine.  We will see if we can      tailor his regimen to have an affordable regimen, see if social services      will assist.  Unfortunately he is unemployed and he is no longer      eligible for the free clinic.  3.  Renal insufficiency with extensive workup.  This is most likely on the      basis of his uncontrolled hypertension.  4.  History of substance abuse.  He emphatically denies any use.  His drug      screen today is negative. It was emphasized how important it is for him      to comply, in order to prevent further issues. He is also advised not to      take aspirin until cleared by his neurologist.      Vanetta Mulders. Hillery Jacks, M.D.  Electronically Signed     FED/MEDQ  D:  11/13/2005  T:  11/14/2005  Job:  [id number]

## 2011-05-23 LAB — ABO/RH: ABO/RH(D): A POS

## 2011-05-23 LAB — DIFFERENTIAL
Basophils Absolute: 0
Basophils Absolute: 0
Basophils Absolute: 0
Basophils Relative: 0
Basophils Relative: 0
Basophils Relative: 0
Basophils Relative: 1
Basophils Relative: 1
Eosinophils Absolute: 0.1
Eosinophils Absolute: 0.1
Eosinophils Absolute: 0.2
Eosinophils Absolute: 0.2
Eosinophils Absolute: 0.2
Eosinophils Relative: 0
Eosinophils Relative: 1
Eosinophils Relative: 2
Eosinophils Relative: 3
Eosinophils Relative: 3
Lymphocytes Relative: 10 — ABNORMAL LOW
Lymphocytes Relative: 14
Lymphocytes Relative: 15
Lymphocytes Relative: 5 — ABNORMAL LOW
Lymphs Abs: 0.8
Lymphs Abs: 0.8
Lymphs Abs: 0.9
Lymphs Abs: 1
Lymphs Abs: 1
Lymphs Abs: 1.1
Monocytes Absolute: 0.5
Monocytes Absolute: 0.8
Monocytes Absolute: 0.9
Monocytes Absolute: 1.1 — ABNORMAL HIGH
Monocytes Relative: 11
Monocytes Relative: 6
Monocytes Relative: 7
Monocytes Relative: 7
Monocytes Relative: 8
Monocytes Relative: 9
Neutro Abs: 12 — ABNORMAL HIGH
Neutro Abs: 4.8
Neutro Abs: 7
Neutrophils Relative %: 73
Neutrophils Relative %: 74
Neutrophils Relative %: 78 — ABNORMAL HIGH
Neutrophils Relative %: 82 — ABNORMAL HIGH

## 2011-05-23 LAB — CBC
HCT: 23.4 — ABNORMAL LOW
HCT: 28.8 — ABNORMAL LOW
HCT: 29.5 — ABNORMAL LOW
HCT: 32.4 — ABNORMAL LOW
Hemoglobin: 10 — ABNORMAL LOW
Hemoglobin: 11 — ABNORMAL LOW
Hemoglobin: 8.4 — ABNORMAL LOW
Hemoglobin: 9.6 — ABNORMAL LOW
MCHC: 32.8
MCHC: 32.9
MCHC: 33.1
MCHC: 34.2
MCHC: 34.2
MCV: 86.5
MCV: 87.1
MCV: 87.4
MCV: 87.9
MCV: 88.2
Platelets: 243
Platelets: 430 — ABNORMAL HIGH
Platelets: 564 — ABNORMAL HIGH
RBC: 2.84 — ABNORMAL LOW
RBC: 2.91 — ABNORMAL LOW
RBC: 3.36 — ABNORMAL LOW
RBC: 3.71 — ABNORMAL LOW
RDW: 12.5
RDW: 13.4
RDW: 13.5
WBC: 14.8 — ABNORMAL HIGH
WBC: 6.4
WBC: 6.6
WBC: 7.2
WBC: 9

## 2011-05-23 LAB — BASIC METABOLIC PANEL
BUN: 41 — ABNORMAL HIGH
BUN: 45 — ABNORMAL HIGH
BUN: 65 — ABNORMAL HIGH
BUN: 70 — ABNORMAL HIGH
BUN: 79 — ABNORMAL HIGH
CO2: 16 — ABNORMAL LOW
CO2: 18 — ABNORMAL LOW
CO2: 18 — ABNORMAL LOW
CO2: 20
CO2: 22
CO2: 24
Calcium: 7.4 — ABNORMAL LOW
Calcium: 8 — ABNORMAL LOW
Calcium: 8.2 — ABNORMAL LOW
Calcium: 8.2 — ABNORMAL LOW
Calcium: 8.4
Calcium: 8.4
Calcium: 8.6
Calcium: 8.7
Chloride: 108
Chloride: 110
Chloride: 111
Chloride: 113 — ABNORMAL HIGH
Creatinine, Ser: 3.97 — ABNORMAL HIGH
Creatinine, Ser: 4.16 — ABNORMAL HIGH
Creatinine, Ser: 4.42 — ABNORMAL HIGH
Creatinine, Ser: 5.53 — ABNORMAL HIGH
Creatinine, Ser: 6.4 — ABNORMAL HIGH
Creatinine, Ser: 6.45 — ABNORMAL HIGH
Creatinine, Ser: 6.86 — ABNORMAL HIGH
GFR calc Af Amer: 11 — ABNORMAL LOW
GFR calc Af Amer: 15 — ABNORMAL LOW
GFR calc Af Amer: 17 — ABNORMAL LOW
GFR calc Af Amer: 18 — ABNORMAL LOW
GFR calc Af Amer: 21 — ABNORMAL LOW
GFR calc non Af Amer: 10 — ABNORMAL LOW
GFR calc non Af Amer: 11 — ABNORMAL LOW
GFR calc non Af Amer: 14 — ABNORMAL LOW
GFR calc non Af Amer: 15 — ABNORMAL LOW
GFR calc non Af Amer: 15 — ABNORMAL LOW
GFR calc non Af Amer: 17 — ABNORMAL LOW
GFR calc non Af Amer: 9 — ABNORMAL LOW
Glucose, Bld: 101 — ABNORMAL HIGH
Glucose, Bld: 101 — ABNORMAL HIGH
Glucose, Bld: 112 — ABNORMAL HIGH
Glucose, Bld: 114 — ABNORMAL HIGH
Glucose, Bld: 115 — ABNORMAL HIGH
Glucose, Bld: 154 — ABNORMAL HIGH
Glucose, Bld: 94
Glucose, Bld: 97
Potassium: 3.6
Potassium: 4.1
Potassium: 4.2
Potassium: 5.3 — ABNORMAL HIGH
Potassium: 5.4 — ABNORMAL HIGH
Sodium: 135
Sodium: 138
Sodium: 138
Sodium: 141

## 2011-05-23 LAB — RETICULOCYTES
RBC.: 2.67 — ABNORMAL LOW
Retic Count, Absolute: 8 — ABNORMAL LOW
Retic Ct Pct: 0.3 — ABNORMAL LOW

## 2011-05-23 LAB — IRON AND TIBC
Iron: 30 — ABNORMAL LOW
UIBC: 91

## 2011-05-23 LAB — BLOOD GAS, ARTERIAL
pCO2 arterial: 27.3 — ABNORMAL LOW
pH, Arterial: 7.301 — ABNORMAL LOW
pO2, Arterial: 89.9

## 2011-05-23 LAB — PTH, INTACT AND CALCIUM: Calcium, Total (PTH): 8.2 — ABNORMAL LOW

## 2011-05-23 LAB — CROSSMATCH: Antibody Screen: NEGATIVE

## 2011-05-23 LAB — TRANSFUSION REACTION
DAT C3: NEGATIVE
Post RXN DAT IgG: NEGATIVE

## 2011-05-23 LAB — STREP A DNA PROBE

## 2011-05-23 LAB — RAPID STREP SCREEN (MED CTR MEBANE ONLY): Streptococcus, Group A Screen (Direct): NEGATIVE

## 2011-05-23 LAB — URINE MICROSCOPIC-ADD ON

## 2011-05-23 LAB — URINALYSIS, ROUTINE W REFLEX MICROSCOPIC
Glucose, UA: NEGATIVE
Ketones, ur: NEGATIVE
Protein, ur: 30 — AB
Urobilinogen, UA: 0.2
pH: 6.5

## 2011-05-23 LAB — CULTURE, BLOOD (ROUTINE X 2)

## 2011-05-23 LAB — PHENYTOIN LEVEL, TOTAL: Phenytoin Lvl: 4.4 — ABNORMAL LOW

## 2011-05-23 LAB — URINE CULTURE: Colony Count: >=100000

## 2011-05-23 LAB — VITAMIN B12: Vitamin B-12: 313 (ref 211–911)

## 2011-06-03 LAB — URINALYSIS, ROUTINE W REFLEX MICROSCOPIC
Bilirubin Urine: NEGATIVE
Glucose, UA: NEGATIVE
Ketones, ur: NEGATIVE
Nitrite: NEGATIVE
Protein, ur: NEGATIVE
Specific Gravity, Urine: 1.01
Urobilinogen, UA: 0.2

## 2011-06-03 LAB — COMPREHENSIVE METABOLIC PANEL
ALT: 17
Albumin: 3.7
Calcium: 8.9
GFR calc Af Amer: 30 — ABNORMAL LOW
Glucose, Bld: 87
Potassium: 5.9 — ABNORMAL HIGH
Sodium: 140
Total Protein: 6.8

## 2011-06-03 LAB — DIFFERENTIAL
Eosinophils Absolute: 0.3
Lymphs Abs: 1.5
Monocytes Absolute: 0.6
Monocytes Relative: 8
Neutrophils Relative %: 69

## 2011-06-03 LAB — B-NATRIURETIC PEPTIDE (CONVERTED LAB): Pro B Natriuretic peptide (BNP): <30

## 2011-06-03 LAB — CBC
MCHC: 32.9
Platelets: 206
RDW: 13.3

## 2011-06-14 LAB — I-STAT 8, (EC8 V) (CONVERTED LAB)
Chloride: 109
Glucose, Bld: 126 — ABNORMAL HIGH
Potassium: 3.7
pH, Ven: 7.335 — ABNORMAL HIGH

## 2011-06-14 LAB — ETHANOL: Alcohol, Ethyl (B): <5

## 2011-06-14 LAB — RAPID URINE DRUG SCREEN, HOSP PERFORMED
Amphetamines: NOT DETECTED
Barbiturates: NOT DETECTED
Benzodiazepines: NOT DETECTED
Cocaine: NOT DETECTED

## 2011-06-14 LAB — POCT I-STAT CREATININE: Operator id: 221061

## 2011-10-20 ENCOUNTER — Emergency Department (HOSPITAL_COMMUNITY): Payer: Medicaid Other

## 2011-10-20 ENCOUNTER — Encounter (HOSPITAL_COMMUNITY): Payer: Self-pay | Admitting: *Deleted

## 2011-10-20 ENCOUNTER — Emergency Department (HOSPITAL_COMMUNITY)
Admission: EM | Admit: 2011-10-20 | Discharge: 2011-10-20 | Disposition: A | Discharge status: Home / Self Care | Payer: Medicaid Other | Attending: Emergency Medicine | Admitting: Emergency Medicine

## 2011-10-20 DIAGNOSIS — Y9210 Unspecified residential institution as the place of occurrence of the external cause: Secondary | ICD-10-CM | POA: Insufficient documentation

## 2011-10-20 DIAGNOSIS — S82899A Other fracture of unspecified lower leg, initial encounter for closed fracture: Secondary | ICD-10-CM | POA: Insufficient documentation

## 2011-10-20 DIAGNOSIS — S82409A Unspecified fracture of shaft of unspecified fibula, initial encounter for closed fracture: Secondary | ICD-10-CM | POA: Insufficient documentation

## 2011-10-20 HISTORY — DX: Gastro-esophageal reflux disease without esophagitis: K21.9

## 2011-10-20 HISTORY — DX: Alcohol abuse, uncomplicated: F10.10

## 2011-10-20 HISTORY — DX: Hyperglycemia, unspecified: R73.9

## 2011-10-20 HISTORY — DX: Essential (primary) hypertension: I10

## 2011-10-20 HISTORY — DX: Cerebral infarction, unspecified: I63.9

## 2011-10-20 HISTORY — DX: Cocaine abuse, uncomplicated: F14.10

## 2011-10-20 HISTORY — DX: Nontraumatic intracerebral hemorrhage, unspecified: I61.9

## 2011-10-20 HISTORY — DX: Disorder of kidney and ureter, unspecified: N28.9

## 2011-10-20 HISTORY — DX: Unspecified convulsions: R56.9

## 2011-10-20 HISTORY — DX: Anemia, unspecified: D64.9

## 2011-10-20 MED ORDER — HYDROCODONE-ACETAMINOPHEN 5-325 MG PO TABS: ORAL_TABLET | ORAL | Status: AC

## 2011-10-20 NOTE — Discharge Instructions (Signed)
Fibular Fracture, Ankle, Adult, Treated with or without Immobilization You have a fracture (break) of your fibula. This is the bone in your lower leg located on the outside of the leg. These fractures are easily diagnosed with x-rays. TREATMENT  You have a simple fracture of the part of the fibula which is located between the knee and ankle. This bone usually will heal without problems and can often be treated without casting or splinting. This means the fracture will heal well during normal use and daily activities without being held in place. Sometimes a cast or splint is placed on these fractures if it is needed for comfort or if the bones are badly out of place. HOME CARE INSTRUCTIONS   Apply ice to the injury for 15 to 20 minutes, 3 to 4 times per day while awake, for 2 days. Put the ice in a plastic bag and place a thin towel between the bag of ice and your leg. This helps keep swelling down.   Use crutches as directed. Resume walking without crutches as directed by your caregiver or when comfortable doing so.   Only take over-the-counter or prescription medicines for pain, discomfort, or fever as directed by your caregiver.   Keep appointments for follow up x-rays if these are required.   If you have a removable splint or boot, do not remove the boot unless directed by your caregiver.   Warning: Do not drive a car or operate a motor vehicle until your caregiver specifically tells you it is safe to do so.  SEEK MEDICAL CARE IF:   You have continued severe pain or more swelling   The medications do not control the pain.   Your skin or nails below the injury turn blue or grey or feel cold or numb.   You develop severe pain in the leg or foot.  MAKE SURE YOU:   Understand these instructions.   Will watch your condition.   Will get help right away if you are not doing well or get worse.  Document Released: 08/19/2005 Document Revised: 05/01/2011 Document Reviewed:  03/25/2008 [facility] Patient Information 2012 [place].
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
## 2011-10-21 ENCOUNTER — Ambulatory Visit (INDEPENDENT_AMBULATORY_CARE_PROVIDER_SITE_OTHER): Payer: Medicaid Other | Admitting: Orthopedic Surgery

## 2011-10-21 ENCOUNTER — Encounter: Payer: Self-pay | Admitting: Orthopedic Surgery

## 2011-10-21 VITALS — BP 112/70 | Ht 69.0 in | Wt 140.0 lb

## 2011-10-21 DIAGNOSIS — S8263XA Displaced fracture of lateral malleolus of unspecified fibula, initial encounter for closed fracture: Secondary | ICD-10-CM
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
## 2011-10-21 NOTE — Patient Instructions (Signed)
Wear brace for 6 weeks 

## 2011-10-23 NOTE — ED Provider Notes (Signed)
Medical screening examination/treatment/procedure(s) were performed by non-physician practitioner and as supervising physician I was immediately available for consultation/collaboration.  Gypsy Balsam. Olin Hauser, MD 10/23/11 [phone]

## 2011-12-10 ENCOUNTER — Ambulatory Visit (INDEPENDENT_AMBULATORY_CARE_PROVIDER_SITE_OTHER): Payer: Medicaid Other | Admitting: Orthopedic Surgery

## 2011-12-10 ENCOUNTER — Encounter: Payer: Self-pay | Admitting: Orthopedic Surgery

## 2011-12-10 VITALS — Ht 69.0 in | Wt 140.0 lb

## 2011-12-10 DIAGNOSIS — S8263XA Displaced fracture of lateral malleolus of unspecified fibula, initial encounter for closed fracture: Secondary | ICD-10-CM

## 2011-12-10 NOTE — Progress Notes (Signed)
Patient ID: Kurt Nelson, male   DOB: 06-10-1956, 56 y.o.   MRN: [id number] Chief Complaint  Patient presents with  . Follow-up    6 week recheck on right ankle with xray.    Six-week status post fracture right ankle, x-rays today. Treatment was with a Cam Walker  Patient complains of no pain at the fracture  Physical exam is normal except for some swelling. There is no palpable tenderness no pain with rotation of the foot length relative to the tibia.  Separate x-ray report previous films are used for comparison  Patient is a lateral malleolus fracture  There is some mild abnormality also the medial malleolus  The fracture is in appropriate position healing appropriately there is some osteopenia. Alignment is normal including ankle mortise  Impression healing fracture right ankle.

## 2011-12-10 NOTE — Patient Instructions (Signed)
Wear regular shoes

## 2012-06-24 ENCOUNTER — Other Ambulatory Visit (HOSPITAL_COMMUNITY): Payer: Self-pay | Admitting: Nephrology

## 2012-06-24 DIAGNOSIS — N289 Disorder of kidney and ureter, unspecified: Secondary | ICD-10-CM

## 2012-07-07 ENCOUNTER — Ambulatory Visit (HOSPITAL_COMMUNITY)
Admission: RE | Admit: 2012-07-07 | Discharge: 2012-07-07 | Disposition: A | Discharge status: Home / Self Care | Payer: Medicaid Other | Source: Ambulatory Visit | Attending: Nephrology | Admitting: Nephrology

## 2012-07-07 DIAGNOSIS — N289 Disorder of kidney and ureter, unspecified: Secondary | ICD-10-CM

## 2012-07-07 DIAGNOSIS — I714 Abdominal aortic aneurysm, without rupture, unspecified: Secondary | ICD-10-CM | POA: Insufficient documentation

## 2012-11-10 ENCOUNTER — Encounter: Payer: Self-pay | Admitting: Vascular Surgery

## 2012-11-11 ENCOUNTER — Ambulatory Visit (INDEPENDENT_AMBULATORY_CARE_PROVIDER_SITE_OTHER): Payer: Medicaid Other | Admitting: Vascular Surgery

## 2012-11-11 ENCOUNTER — Encounter: Payer: Self-pay | Admitting: Vascular Surgery

## 2012-11-11 VITALS — BP 134/85 | HR 78 | Ht 69.0 in | Wt 158.7 lb

## 2012-11-11 DIAGNOSIS — I714 Abdominal aortic aneurysm, without rupture: Secondary | ICD-10-CM | POA: Insufficient documentation

## 2012-11-11 NOTE — Progress Notes (Signed)
[facility] [place]  Patient name: Kurt Nelson MRN: [id number] DOB: July 25, 1956 Sex: male  REASON FOR CONSULT: small abdominal aortic aneurysm. Referred byChristin Gusler, NP  HPI: Kurt Nelson is a 57 y.o. male who was undergoing a workup for renal insufficiency. This included an ultrasound of his kidneys. An incidental finding was a small fusiform infrarenal abdominal aortic aneurysm which measured 2.8 cm in maximum diameter. He is sent for vascular consultation. He denies any history of abdominal pain or back pain. He does have a history of hypertension but denies any history of diabetes, hypercholesterolemia, history of previous myocardial infarction or history of congestive heart failure. He denies any recent chest pain. He lives in assisted living and is ambulatory with a walker.  Past Medical History  Diagnosis Date  . Hypertension   . Stroke   . Seizures   . ICH (intracerebral hemorrhage)   . Hyperglycemia   . ETOH abuse   . Cocaine abuse   . Renal disorder     renal failure  . GERD (gastroesophageal reflux disease)   . Anemia, normocytic normochromic   . AAA (abdominal aortic aneurysm)     History reviewed. No pertinent family history. He denies any history of aneurysmal disease in his family.  SOCIAL HISTORY: History  Substance Use Topics  . Smoking status: Former Research scientist (life sciences)  . Smokeless tobacco: Not on file  . Alcohol Use: No    Allergies  Allergen Reactions  . Penicillins     Current Outpatient Prescriptions  Medication Sig Dispense Refill  . amLODipine (NORVASC) 10 MG tablet Take 10 mg by mouth daily.      Marland Kitchen aspirin 81 MG tablet Take 81 mg by mouth daily.      . cholecalciferol (VITAMIN D) 1000 UNITS tablet Take 1,000 Units by mouth daily.      . cloNIDine (CATAPRES) 0.2 MG tablet Take 0.2 mg by mouth 2 (two) times daily.      . Difluprednate (DUREZOL) 0.05 % EMUL Apply 1 drop to eye 2 (two) times daily.      . dorzolamide-timolol  (COSOPT) 22.3-6.8 MG/ML ophthalmic solution Place 1 drop into the left eye 2 (two) times daily.      . fish oil-omega-3 fatty acids 1000 MG capsule Take 2 g by mouth daily.      Marland Kitchen labetalol (NORMODYNE) 300 MG tablet Take 300 mg by mouth 2 (two) times daily.      Marland Kitchen levothyroxine (SYNTHROID, LEVOTHROID) 50 MCG tablet Take 50 mcg by mouth daily.      . minoxidil (LONITEN) 2.5 MG tablet Take 2.5 mg by mouth 2 (two) times daily.       Marland Kitchen omeprazole (PRILOSEC) 20 MG capsule Take 20 mg by mouth daily.      . phenytoin (DILANTIN) 100 MG ER capsule Take by mouth 3 (three) times daily.      . prednisoLONE acetate (PRED FORTE) 1 % ophthalmic suspension Place 1 drop into the left eye 2 (two) times daily.      . pravastatin (PRAVACHOL) 40 MG tablet Take 40 mg by mouth daily.       No current facility-administered medications for this visit.    REVIEW OF SYSTEMS: Valu.Nieves ] denotes positive finding; [  ] denotes negative finding  CARDIOVASCULAR:  [ ]  chest pain   [ ]  chest pressure   [ ]  palpitations   [ ]  orthopnea   [ ]  dyspnea on exertion   [ ]  claudication   [ ]   rest pain   [ ]  DVT   [ ]  phlebitis PULMONARY:   [ ]  productive cough   [ ]  asthma   [ ]  wheezing NEUROLOGIC:   Valu.Nieves ] weakness  Valu.Nieves ] paresthesias  [ ]  aphasia  [ ]  amaurosis  [ ]  dizziness HEMATOLOGIC:   [ ]  bleeding problems   [ ]  clotting disorders MUSCULOSKELETAL:  [ ]  joint pain   [ ]  joint swelling [ ]  leg swelling GASTROINTESTINAL: [ ]   blood in stool  [ ]   hematemesis GENITOURINARY:  [ ]   dysuria  [ ]   hematuria PSYCHIATRIC:  [ ]  history of major depression INTEGUMENTARY:  [ ]  rashes  [ ]  ulcers CONSTITUTIONAL:  [ ]  fever   [ ]  chills  PHYSICAL EXAM: Filed Vitals:   11/11/12 0903  BP: 134/85  Pulse: 78  Height: 5\' 9"  (1.753 m)  Weight: 158 lb 11.2 oz (71.986 kg)  SpO2: 98%   Body mass index is 23.43 kg/(m^2). GENERAL: The patient is a well-nourished male, in no acute distress. The vital signs are documented above. CARDIOVASCULAR:  There is a regular rate and rhythm. I did not detect carotid bruits. He has palpable femoral pulses. I cannot palpate pedal pulses although both feet are warm and well-perfused. He has mild bilateral lower extremity swelling. PULMONARY: There is good air exchange bilaterally without wheezing or rales. ABDOMEN: Soft and non-tender with normal pitched bowel sounds.  MUSCULOSKELETAL: There are no major deformities or cyanosis. NEUROLOGIC: He has some generalized weakness.SKIN: There are no ulcers or rashes noted. PSYCHIATRIC: The patient answered questions appropriately.Marland Kitchen  DATA:  I have reviewed his records from [facility] well [facility]. He is being worked up for renal insufficiency. This may be related to his hypertension.  I have reviewed his ultrasound which was done at [facility]. This shows bilateral echogenic kidneys consistent with medical renal disease. There is no renal mass and no hydronephrosis. An incidental finding was a 2.8 cm infrarenal abdominal aortic aneurysm.  MEDICAL ISSUES:  AAA (abdominal aortic aneurysm) without rupture This patient has a very small abdominal aortic aneurysm at 2.8 cm. This is asymptomatic. I have recommended a follow up ultrasound in one year and I will see him back at that time. If it remains this small we will stretch his follow up out to 2 years. If there is any enlargement, the we will continue at one year follow up studies. Fortunately, he is not a smoker. I will see him back in one year. He knows to call sooner if he has problems.   [facility] [place] Beeper: [phone]

## 2012-11-11 NOTE — Addendum Note (Signed)
Addended by: Mena Goes on: 11/11/2012 03:56 PM   Modules accepted: Orders

## 2012-11-11 NOTE — Assessment & Plan Note (Signed)
This patient has a very small abdominal aortic aneurysm at 2.8 cm. This is asymptomatic. I have recommended a follow up ultrasound in one year and I will see him back at that time. If it remains this small we will stretch his follow up out to 2 years. If there is any enlargement, the we will continue at one year follow up studies. Fortunately, he is not a smoker. I will see him back in one year. He knows to call sooner if he has problems.

## 2013-11-16 ENCOUNTER — Encounter: Payer: Self-pay | Admitting: Family

## 2013-11-17 ENCOUNTER — Ambulatory Visit (INDEPENDENT_AMBULATORY_CARE_PROVIDER_SITE_OTHER): Payer: Medicaid Other | Admitting: Family

## 2013-11-17 ENCOUNTER — Ambulatory Visit (HOSPITAL_COMMUNITY)
Admission: RE | Admit: 2013-11-17 | Discharge: 2013-11-17 | Disposition: A | Discharge status: Home / Self Care | Payer: Medicaid Other | Source: Ambulatory Visit | Attending: Family | Admitting: Family

## 2013-11-17 ENCOUNTER — Other Ambulatory Visit: Payer: Self-pay | Admitting: Vascular Surgery

## 2013-11-17 ENCOUNTER — Encounter: Payer: Self-pay | Admitting: Family

## 2013-11-17 VITALS — BP 121/77 | HR 68 | Resp 16 | Ht 69.0 in | Wt 159.0 lb

## 2013-11-17 DIAGNOSIS — I77819 Aortic ectasia, unspecified site: Secondary | ICD-10-CM

## 2013-11-17 DIAGNOSIS — I714 Abdominal aortic aneurysm, without rupture, unspecified: Secondary | ICD-10-CM

## 2013-11-17 DIAGNOSIS — I723 Aneurysm of iliac artery: Secondary | ICD-10-CM

## 2013-11-17 NOTE — Addendum Note (Signed)
Addended by: Dorthula Rue L on: 11/17/2013 04:32 PM   Modules accepted: Orders

## 2013-11-17 NOTE — Progress Notes (Signed)
[facility] [place]  Established Abdominal Aortic Aneurysm  History of Present Illness  Kurt Nelson is a 58 y.o. (02/24/1956) male patient of Dr. Scot Dock who was referred after undergoing a workup for renal insufficiency. This included an ultrasound of his kidneys. An incidental finding was a small fusiform infrarenal abdominal aortic aneurysm which measured 2.8 cm in maximum diameter. He is sent for vascular consultation. He denies any history of abdominal pain or back pain. He does have a history of hypertension but denies any history of diabetes, hypercholesterolemia, history of previous myocardial infarction or history of congestive heart failure. He denies any recent chest pain. He lives in assisted living and is ambulatory with a walker.  He presents with chief complaint: follow up for AAA.  Previous studies demonstrate an AAA, measuring 2.6 cm.  The patient does not have back or abdominal pain referable to AAA.  The patient is not a smoker. The patient not claudication in legs with walking. The patient positive history of stroke or TIA symptoms at unknown time. Patient is a resident of [facility] in [place].  His PMHX includes CVA, HTN, cocaine abuse in remission, renal failure (not on dialysis), seizure disorder.  Pt Diabetic: No Pt smoker: non-smoker  Past Medical History  Diagnosis Date  . Hypertension   . Stroke   . Seizures   . ICH (intracerebral hemorrhage)   . Hyperglycemia   . ETOH abuse   . Cocaine abuse   . Renal disorder     renal failure  . GERD (gastroesophageal reflux disease)   . Anemia, normocytic normochromic   . AAA (abdominal aortic aneurysm)    History reviewed. No pertinent past surgical history. Social History History   Social History  . Marital Status: Single    Spouse Name: N/A    Number of Children: N/A  . Years of Education: 12   Occupational History  . Not on file.   Social History Main Topics   . Smoking status: Former Smoker    Quit date: 09/02/2010  . Smokeless tobacco: Never Used  . Alcohol Use: No  . Drug Use: Yes    Special: Codeine     Comment: Abuse  . Sexual Activity: Not on file   Other Topics Concern  . Not on file   Social History Narrative  . No narrative on file   Family History Family History  Problem Relation Age of Onset  . Hypertension Mother   . Hypertension Sister   . Hypertension Sister     Current Outpatient Prescriptions on File Prior to Visit  Medication Sig Dispense Refill  . amLODipine (NORVASC) 10 MG tablet Take 10 mg by mouth daily.      Marland Kitchen aspirin 81 MG tablet Take 81 mg by mouth daily.      . cloNIDine (CATAPRES) 0.2 MG tablet Take 0.2 mg by mouth 2 (two) times daily.      . dorzolamide-timolol (COSOPT) 22.3-6.8 MG/ML ophthalmic solution Place 1 drop into the left eye 2 (two) times daily.      . fish oil-omega-3 fatty acids 1000 MG capsule Take 2 g by mouth daily.      Marland Kitchen labetalol (NORMODYNE) 300 MG tablet Take 300 mg by mouth 2 (two) times daily.      Marland Kitchen levothyroxine (SYNTHROID, LEVOTHROID) 50 MCG tablet Take 50 mcg by mouth daily.      . minoxidil (LONITEN) 2.5 MG tablet Take 2.5 mg by mouth 2 (two) times  daily.       . omeprazole (PRILOSEC) 20 MG capsule Take 20 mg by mouth daily.      . phenytoin (DILANTIN) 100 MG ER capsule Take by mouth 3 (three) times daily.      . prednisoLONE acetate (PRED FORTE) 1 % ophthalmic suspension Place 1 drop into the left eye 2 (two) times daily.      . cholecalciferol (VITAMIN D) 1000 UNITS tablet Take 1,000 Units by mouth daily.      . Difluprednate (DUREZOL) 0.05 % EMUL Apply 1 drop to eye 2 (two) times daily.      . pravastatin (PRAVACHOL) 40 MG tablet Take 40 mg by mouth daily.       No current facility-administered medications on file prior to visit.   Allergies  Allergen Reactions  . Penicillins Itching and Rash    ROS: See HPI for pertinent positives and negatives.  Physical  Examination  Filed Vitals:   11/17/13 0945  BP: 121/77  Pulse: 68  Resp: 16  Height: 5\' 9"  (1.753 m)  Weight: 159 lb (72.122 kg)  SpO2: 97%   Body mass index is 23.47 kg/(m^2).  General: A&O x 3, WD, walks with cane in right hand, gait is stacato but seems steady.  Pulmonary: Sym exp, good air movt, CTAB, no rales, rhonchi, or wheezing.  Cardiac: RRR, Nl S1, S2, no detected murmur.   Carotid Bruits Left Right   Negative Negative   Aorta is not palpable Radial pulses are 2+ palpable and =.                          VASCULAR EXAM:                                                                                                         LE Pulses LEFT RIGHT       POPLITEAL  not palpable   not palpable     Gastrointestinal: soft, NTND, -G/R, - HSM, - masses, - CVAT B.  Musculoskeletal: M/S 5/5 throughout in RUE and RLE, 4/5 in LUE and LLE, Extremities without ischemic changes.  Neurologic: CN 2-12 intact  except uvual deviation to the left, Pain and light touch intact in extremities are intact except, Motor exam as listed above.  Non-Invasive Vascular Imaging  AAA Duplex (11/17/2013)  Previous size: 2.6 cm (MRI) (Date: 2007)  Current size:  2.15 cm (Date: 11/17/2013)  Medical Decision Making  The patient is a 58 y.o. male who presents with asymptomatic AAA with decreasing size, aorta is actually normal size.   Based on this patient's exam and diagnostic studies, the patient will follow up in 2 years  with the following studies: AAA Duplex.  The threshold for repair is AAA size > 5.5 cm, growth > 1 cm/yr, and symptomatic status.  I emphasized the importance of maximal medical management including strict control of blood pressure, blood glucose, and lipid levels, antiplatelet agents, obtaining regular exercise, and continued cessation of smoking.   The patient was given information  about AAA including signs, symptoms, treatment, and how to minimize the risk of  enlargement and rupture of aneurysms.    The patient was advised to call 911 should the patient experience sudden onset abdominal or back pain.   Thank you for allowing [place] to participate in this patient's care.  Clemon Chambers, RN, MSN, FNP-C [facility] [place] Office: [phone]  Clinic Physician: Scot Dock  11/17/2013, 9:54 AM

## 2013-11-17 NOTE — Patient Instructions (Signed)

## 2014-09-06 ENCOUNTER — Emergency Department (HOSPITAL_COMMUNITY)
Admission: EM | Admit: 2014-09-06 | Discharge: 2014-09-06 | Disposition: A | Discharge status: Home / Self Care | Payer: Medicaid Other | Attending: Emergency Medicine | Admitting: Emergency Medicine

## 2014-09-06 ENCOUNTER — Emergency Department (HOSPITAL_COMMUNITY): Payer: Medicaid Other

## 2014-09-06 DIAGNOSIS — Z862 Personal history of diseases of the blood and blood-forming organs and certain disorders involving the immune mechanism: Secondary | ICD-10-CM | POA: Diagnosis not present

## 2014-09-06 DIAGNOSIS — Z79899 Other long term (current) drug therapy: Secondary | ICD-10-CM | POA: Insufficient documentation

## 2014-09-06 DIAGNOSIS — Z87891 Personal history of nicotine dependence: Secondary | ICD-10-CM | POA: Insufficient documentation

## 2014-09-06 DIAGNOSIS — Z7982 Long term (current) use of aspirin: Secondary | ICD-10-CM | POA: Diagnosis not present

## 2014-09-06 DIAGNOSIS — K219 Gastro-esophageal reflux disease without esophagitis: Secondary | ICD-10-CM | POA: Diagnosis not present

## 2014-09-06 DIAGNOSIS — Z880 Allergy status to penicillin: Secondary | ICD-10-CM | POA: Insufficient documentation

## 2014-09-06 DIAGNOSIS — I10 Essential (primary) hypertension: Secondary | ICD-10-CM | POA: Insufficient documentation

## 2014-09-06 DIAGNOSIS — Z8673 Personal history of transient ischemic attack (TIA), and cerebral infarction without residual deficits: Secondary | ICD-10-CM | POA: Diagnosis not present

## 2014-09-06 DIAGNOSIS — R55 Syncope and collapse: Secondary | ICD-10-CM | POA: Diagnosis not present

## 2014-09-06 DIAGNOSIS — Z7952 Long term (current) use of systemic steroids: Secondary | ICD-10-CM | POA: Insufficient documentation

## 2014-09-06 LAB — URINALYSIS, ROUTINE W REFLEX MICROSCOPIC
Glucose, UA: NEGATIVE mg/dL
HGB URINE DIPSTICK: NEGATIVE
Ketones, ur: NEGATIVE mg/dL
Leukocytes, UA: NEGATIVE
NITRITE: NEGATIVE
PH: 5.5 (ref 5.0–8.0)
Protein, ur: 30 mg/dL — AB
SPECIFIC GRAVITY, URINE: 1.015 (ref 1.005–1.030)
UROBILINOGEN UA: 1 mg/dL (ref 0.0–1.0)

## 2014-09-06 LAB — CBG MONITORING, ED: GLUCOSE-CAPILLARY: 115 mg/dL — AB (ref 70–99)

## 2014-09-06 LAB — CBC WITH DIFFERENTIAL/PLATELET
Basophils Absolute: 0 10*3/uL (ref 0.0–0.1)
Basophils Relative: 0 % (ref 0–1)
EOS ABS: 0.2 10*3/uL (ref 0.0–0.7)
Eosinophils Relative: 3 % (ref 0–5)
HCT: 33.6 % — ABNORMAL LOW (ref 39.0–52.0)
Hemoglobin: 11.1 g/dL — ABNORMAL LOW (ref 13.0–17.0)
LYMPHS ABS: 1.7 10*3/uL (ref 0.7–4.0)
Lymphocytes Relative: 26 % (ref 12–46)
MCH: 27.1 pg (ref 26.0–34.0)
MCHC: 33 g/dL (ref 30.0–36.0)
MCV: 82.2 fL (ref 78.0–100.0)
MONOS PCT: 6 % (ref 3–12)
Monocytes Absolute: 0.4 10*3/uL (ref 0.1–1.0)
NEUTROS PCT: 65 % (ref 43–77)
Neutro Abs: 4.4 10*3/uL (ref 1.7–7.7)
Platelets: 180 10*3/uL (ref 150–400)
RBC: 4.09 MIL/uL — AB (ref 4.22–5.81)
RDW: 12.8 % (ref 11.5–15.5)
WBC: 6.7 10*3/uL (ref 4.0–10.5)

## 2014-09-06 LAB — COMPREHENSIVE METABOLIC PANEL
ALBUMIN: 3.4 g/dL — AB (ref 3.5–5.2)
ALK PHOS: 129 U/L — AB (ref 39–117)
ALT: 20 U/L (ref 0–53)
ANION GAP: 8 (ref 5–15)
AST: 19 U/L (ref 0–37)
BILIRUBIN TOTAL: 0.2 mg/dL — AB (ref 0.3–1.2)
BUN: 31 mg/dL — AB (ref 6–23)
CHLORIDE: 105 meq/L (ref 96–112)
CO2: 22 mmol/L (ref 19–32)
Calcium: 8.7 mg/dL (ref 8.4–10.5)
Creatinine, Ser: 3.43 mg/dL — ABNORMAL HIGH (ref 0.50–1.35)
GFR calc Af Amer: 21 mL/min — ABNORMAL LOW (ref >90)
GFR calc non Af Amer: 18 mL/min — ABNORMAL LOW (ref >90)
Glucose, Bld: 122 mg/dL — ABNORMAL HIGH (ref 70–99)
POTASSIUM: 5.3 mmol/L — AB (ref 3.5–5.1)
Sodium: 135 mmol/L (ref 135–145)
Total Protein: 6.7 g/dL (ref 6.0–8.3)

## 2014-09-06 LAB — URINE MICROSCOPIC-ADD ON

## 2014-09-06 LAB — TROPONIN I
Troponin I: <0.03 ng/mL (ref <0.031)
Troponin I: <0.03 ng/mL (ref <0.031)

## 2014-09-06 MED ORDER — SODIUM CHLORIDE 0.9 % IV SOLN
1000.0000 mL | INTRAVENOUS | Status: DC
  Administered 2014-09-06: 1000 mL via INTRAVENOUS

## 2014-09-06 MED ORDER — SODIUM CHLORIDE 0.9 % IV SOLN
1000.0000 mL | Freq: Once | INTRAVENOUS | Status: AC
  Administered 2014-09-06: 1000 mL via INTRAVENOUS

## 2014-09-06 MED ORDER — SODIUM CHLORIDE 0.9 % IV BOLUS (SEPSIS)
1000.0000 mL | Freq: Once | INTRAVENOUS | Status: AC
  Administered 2014-09-06: 1000 mL via INTRAVENOUS

## 2014-09-06 NOTE — ED Notes (Signed)
Per ems, @ 1609 Pt. Was eating at the table at the extended care facility that he resides at when he went unconscious and slumped over the table had twitching/shaking. Pt has hx ofr seizures. This episode lasted for approximately 1 minute. Fire Dept had BP on scene at 80/50. Pt does not remember episode and denies any dizziness or pain. EMS 12 lead showed st depression.

## 2014-09-06 NOTE — Discharge Instructions (Signed)
As discussed, it is very important that you follow-up with your primary care physician for a repeat evaluation later this week. If you develop new, or concerning changes in your condition, please be sure to return here immediately for further evaluation and management.

## 2014-09-06 NOTE — ED Notes (Signed)
CBG is 115. Notified nurse Lennette Bihari.

## 2014-09-06 NOTE — ED Provider Notes (Signed)
CSN: [id number]     Arrival date & time 09/06/14  1721 History   First MD Initiated Contact with Patient 09/06/14 1722     Chief Complaint  Patient presents with  . Loss of Consciousness    HPI Patient presents after an episode of syncope. Patient does not recall the event, recalls awakening with EMS providers standing by him. Per report, staff at the patient's nursing facility from the patient presents after syncope at the dining table. Patient states that he has felt generally well recently, denies any chest pain today, either before or after the event. Patient states that he takes all medication as directed. Patient denies smoking, other current medical issues. EMS reports that the patient was hypotensive on their arrival, but blood pressure improved in route. No report of chest pain in route.  Past Medical History  Diagnosis Date  . Hypertension   . Stroke   . Seizures   . ICH (intracerebral hemorrhage)   . Hyperglycemia   . ETOH abuse   . Cocaine abuse   . Renal disorder     renal failure  . GERD (gastroesophageal reflux disease)   . Anemia, normocytic normochromic   . AAA (abdominal aortic aneurysm)    No past surgical history on file. Family History  Problem Relation Age of Onset  . Hypertension Mother   . Hypertension Sister   . Hypertension Sister    History  Substance Use Topics  . Smoking status: Former Smoker    Quit date: 09/02/2010  . Smokeless tobacco: Never Used  . Alcohol Use: No    Review of Systems  Constitutional:       Per HPI, otherwise negative  HENT:       Per HPI, otherwise negative  Respiratory:       Per HPI, otherwise negative  Cardiovascular:       Per HPI, otherwise negative  Gastrointestinal: Negative for vomiting.  Endocrine:       Negative aside from HPI  Genitourinary:       Neg aside from HPI   Musculoskeletal:       Per HPI, otherwise negative  Skin: Negative.   Neurological: Positive for syncope.      Allergies   Penicillins  Home Medications   Prior to Admission medications   Medication Sig Start Date End Date Taking? Authorizing Provider  amLODipine (NORVASC) 10 MG tablet Take 10 mg by mouth daily.    Historical Provider, MD  aspirin 81 MG tablet Take 81 mg by mouth daily.    Historical Provider, MD  cholecalciferol (VITAMIN D) 1000 UNITS tablet Take 1,000 Units by mouth daily.    Historical Provider, MD  cloNIDine (CATAPRES) 0.2 MG tablet Take 0.2 mg by mouth 2 (two) times daily.    Historical Provider, MD  Difluprednate (DUREZOL) 0.05 % EMUL Apply 1 drop to eye 2 (two) times daily.    Historical Provider, MD  dorzolamide-timolol (COSOPT) 22.3-6.8 MG/ML ophthalmic solution Place 1 drop into the left eye 2 (two) times daily.    Historical Provider, MD  fish oil-omega-3 fatty acids 1000 MG capsule Take 2 g by mouth daily.    Historical Provider, MD  labetalol (NORMODYNE) 300 MG tablet Take 300 mg by mouth 2 (two) times daily.    Historical Provider, MD  levothyroxine (SYNTHROID, LEVOTHROID) 50 MCG tablet Take 50 mcg by mouth daily.    Historical Provider, MD  lisinopril (PRINIVIL,ZESTRIL) 5 MG tablet Take 5 mg by mouth daily.  Historical Provider, MD  minoxidil (LONITEN) 2.5 MG tablet Take 2.5 mg by mouth 2 (two) times daily.     Historical Provider, MD  omeprazole (PRILOSEC) 20 MG capsule Take 20 mg by mouth daily.    Historical Provider, MD  phenytoin (DILANTIN) 100 MG ER capsule Take by mouth 3 (three) times daily.    Historical Provider, MD  pravastatin (PRAVACHOL) 40 MG tablet Take 40 mg by mouth daily.    Historical Provider, MD  prednisoLONE acetate (PRED FORTE) 1 % ophthalmic suspension Place 1 drop into the left eye 2 (two) times daily.    Historical Provider, MD   BP 95/63 mmHg  Pulse 64  Temp(Src) 97.6 F (36.4 C) (Oral)  Resp 16  SpO2 100% Physical Exam  Constitutional: He is oriented to person, place, and time. He appears well-developed. No distress.  HENT:  Head:  Normocephalic and atraumatic.  Edentulous  Eyes: Conjunctivae and EOM are normal.  Cardiovascular: Normal rate and regular rhythm.   Pulmonary/Chest: Effort normal. No stridor. No respiratory distress.  Abdominal: He exhibits no distension. There is no tenderness. There is no rebound.  No appreciable mass  Musculoskeletal: He exhibits no edema.  Neurological: He is alert and oriented to person, place, and time.  Skin: Skin is warm and dry.  Psychiatric: He has a normal mood and affect.  Nursing note and vitals reviewed.   ED Course  Procedures (including critical care time) Labs Review Labs Reviewed  CBC WITH DIFFERENTIAL - Abnormal; Notable for the following:    RBC 4.09 (*)    Hemoglobin 11.1 (*)    HCT 33.6 (*)    All other components within normal limits  COMPREHENSIVE METABOLIC PANEL - Abnormal; Notable for the following:    Potassium 5.3 (*)    Glucose, Bld 122 (*)    BUN 31 (*)    Creatinine, Ser 3.43 (*)    Albumin 3.4 (*)    Alkaline Phosphatase 129 (*)    Total Bilirubin 0.2 (*)    GFR calc non Af Amer 18 (*)    GFR calc Af Amer 21 (*)    All other components within normal limits  URINALYSIS, ROUTINE W REFLEX MICROSCOPIC - Abnormal; Notable for the following:    APPearance CLOUDY (*)    Bilirubin Urine SMALL (*)    Protein, ur 30 (*)    All other components within normal limits  URINE MICROSCOPIC-ADD ON - Abnormal; Notable for the following:    Bacteria, UA MANY (*)    All other components within normal limits  CBG MONITORING, ED - Abnormal; Notable for the following:    Glucose-Capillary 115 (*)    All other components within normal limits  TROPONIN I  TROPONIN I  POCT CBG (FASTING - GLUCOSE)-MANUAL ENTRY    Imaging Review Dg Chest 2 View  09/06/2014   CLINICAL DATA:  Syncope for 1 day.  Hypotension.  EXAM: CHEST  2 VIEW  COMPARISON:  02/08/2010  FINDINGS: Lower lung volumes from prior. The heart is enlarged. There is mild pulmonary edema superimposed on  chronic interstitial prominence. No definite pleural effusion. Minimal atelectasis at the right lung base. There is no pneumothorax. The bones are under mineralized, no acute osseous abnormality.  IMPRESSION: Low lung volumes with cardiomegaly and mild pulmonary edema, question CHF.   Electronically Signed   By: Jeb Levering M.D.   On: 09/06/2014 18:38   US Aorta  09/06/2014   CLINICAL DATA:  Acute onset of hypotension. Syncope.  Known abdominal aortic aneurysm. Initial encounter.  EXAM: ULTRASOUND OF ABDOMINAL AORTA  TECHNIQUE: Ultrasound examination of the abdominal aorta was performed to evaluate for abdominal aortic aneurysm.  COMPARISON:  Renal ultrasound performed 07/07/2012  FINDINGS: Abdominal Aorta  The patient's abdominal aortic aneurysm has increased mildly in size from the prior ultrasound. Proximally, the abdominal aorta measures 2.6 cm in AP dimension and 3.0 cm and transverse dimension. The mid abdominal aorta measures 2.4 cm in AP dimension and 3.4 cm in transverse dimension. The distal abdominal aorta is minimally ectatic, measuring up to 2.6 cm in transverse dimension. There is mild ectasia of the common iliac arteries bilaterally, measuring 1.8 cm in AP dimension. Mild mural thrombus is suggested along the abdominal aorta, without significant luminal narrowing.  Maximum AP  Diameter:  2.6 cm  Maximum TRV  Diameter: 3.4 cm  IMPRESSION: Abdominal aortic aneurysm has increased mildly in size from the prior ultrasound, now measuring up to 3.4 cm in maximal transverse dimension. This is a fusiform aneurysm, most prominent at the mid abdominal aorta. Mild mural thrombus suggested, without significant luminal narrowing. Mild ectasia of the common iliac arteries bilaterally, measuring 1.8 cm in AP dimension.  Recommend followup by [place] in 3 years. This recommendation follows ACR consensus guidelines: White Paper of the ACR Incidental Findings Committee II on Vascular Findings. J Am Coll Radiol 2013;  10:789-794.   Electronically Signed   By: Garald Balding M.D.   On: 09/06/2014 19:13     EKG Interpretation   Date/Time:  Tuesday September 06 2014 17:29:51 EST Ventricular Rate:  68 PR Interval:  136 QRS Duration: 87 QT Interval:  422 QTC Calculation: 449 R Axis:   50 Text Interpretation:  Sinus rhythm LVH with secondary repolarization  abnormality Anterior ST elevation, probably due to LVH Sinus rhythm Left  ventricular hypertrophy T wave abnormality Abnormal ekg Confirmed by  Carmin Muskrat  MD [phone]) on 09/06/2014 5:42:39 PM     I reviewed the patient's episode w EMS providers. The rhythm strips in the ambulance are similar to our ECG.  Review of the chart show Hx of AAA.   Update: Patient in no distress. Initial labs back, reassuring for the low suspicion of ongoing coronary ischemia, but the patient does have evidence of worsening chronic kidney disease. On repeat exam the patient appears calm, continues to deny any complaints. He has received 2 L of fluid, states that he feels better, and blood pressure is now normal. Patient will have repeat troponin. Update: Second troponin normal, I discussed all findings the patient and 2 daughters. Given patient's history of prior kidney disease, as well as lites numeric worse findings, patient will follow-up with his physicians week for additional evaluation, additional lab work. With no ongoing plans, no evidence for arrhythmia, and discharged in stable condition.   MDM   Final diagnoses:  Syncope   acute on chronic kidney disease  Patient presents after a witnessed syncope event. Throughout the patient's hours of monitoring in the emergency department, the patient was in no distress, with normalizing blood pressure, no chest pain, no new complaints. Patient's labs demonstrated acute on chronic renal failure, and the patient improved as he had fluid resuscitation. With no ongoing complaints, patient was discharged to follow-up  with his primary care team for repeat evaluation.     Carmin Muskrat, MD 09/06/14 2342

## 2015-07-31 ENCOUNTER — Ambulatory Visit (HOSPITAL_COMMUNITY)
Admission: RE | Admit: 2015-07-31 | Discharge: 2015-07-31 | Disposition: A | Discharge status: Home / Self Care | Payer: Medicaid Other | Source: Ambulatory Visit | Attending: Internal Medicine | Admitting: Internal Medicine

## 2015-07-31 ENCOUNTER — Other Ambulatory Visit (HOSPITAL_COMMUNITY): Payer: Self-pay | Admitting: Internal Medicine

## 2015-07-31 DIAGNOSIS — M79642 Pain in left hand: Secondary | ICD-10-CM | POA: Insufficient documentation

## 2015-07-31 DIAGNOSIS — M7989 Other specified soft tissue disorders: Secondary | ICD-10-CM | POA: Insufficient documentation

## 2015-11-15 ENCOUNTER — Encounter: Payer: Self-pay | Admitting: Family

## 2015-11-22 ENCOUNTER — Other Ambulatory Visit (HOSPITAL_COMMUNITY): Payer: Medicaid Other

## 2015-11-22 ENCOUNTER — Ambulatory Visit (INDEPENDENT_AMBULATORY_CARE_PROVIDER_SITE_OTHER): Payer: Medicaid Other | Admitting: Family

## 2015-11-22 ENCOUNTER — Encounter: Payer: Self-pay | Admitting: Family

## 2015-11-22 ENCOUNTER — Ambulatory Visit: Payer: Medicaid Other | Admitting: Family

## 2015-11-22 VITALS — BP 135/83 | HR 73 | Temp 98.3°F | Resp 16 | Ht 68.5 in | Wt 148.0 lb

## 2015-11-22 DIAGNOSIS — I714 Abdominal aortic aneurysm, without rupture, unspecified: Secondary | ICD-10-CM

## 2015-11-22 NOTE — Progress Notes (Signed)
[facility] [place]  Established Abdominal Aortic Aneurysm  History of Present Illness  Kurt Nelson is a 60 y.o. (03-Apr-1956) male patient of Dr. Scot Dock who was initially referred after undergoing a workup for renal insufficiency. This included an ultrasound of his kidneys. An incidental finding was a small fusiform infrarenal abdominal aortic aneurysm which measured 2.8 cm in maximum diameter. He is sent for vascular consultation. He denies any history of abdominal pain or back pain. He does have a history of hypertension but denies any history of diabetes, hypercholesterolemia, history of previous myocardial infarction or history of congestive heart failure. He denies any recent chest pain. He is ambulatory with a walker. He presents with chief complaint: follow up for AAA. His insurance requires an evaluation by a medical provider prior to his AAA duplex approval for coverage; he is here today for this.  Previous studies demonstrate an AAA, measuring 2.6 cm. The patient does not have back or abdominal pain referable to AAA.  The patient denies claudication in legs with walking. The patient positive history of stroke or TIA symptoms at unknown time. Patient is a resident of [facility] in [place].  His PMHX includes CVA, HTN, cocaine abuse in remission, renal failure (not on dialysis), seizure disorder.  Pt Diabetic: No Pt smoker: smokes 3 cigarettes/day  Past Medical History  Diagnosis Date  . Hypertension   . Stroke ([place])   . Seizures ([place])   . ICH (intracerebral hemorrhage) ([place])   . Hyperglycemia   . ETOH abuse   . Cocaine abuse   . Renal disorder     renal failure  . GERD (gastroesophageal reflux disease)   . Anemia, normocytic normochromic   . AAA (abdominal aortic aneurysm) ([place])    No past surgical history on file. Social History Social History   Social History  . Marital Status: Single    Spouse Name: N/A  . Number of  Children: N/A  . Years of Education: 12   Occupational History  . Not on file.   Social History Main Topics  . Smoking status: Former Smoker    Quit date: 09/02/2010  . Smokeless tobacco: Never Used  . Alcohol Use: No  . Drug Use: Yes    Special: Codeine     Comment: Abuse  . Sexual Activity: Not on file   Other Topics Concern  . Not on file   Social History Narrative   Family History Family History  Problem Relation Age of Onset  . Hypertension Mother   . Hypertension Sister   . Hypertension Sister     Current Outpatient Prescriptions on File Prior to Visit  Medication Sig Dispense Refill  . amLODipine (NORVASC) 10 MG tablet Take 10 mg by mouth daily.    Marland Kitchen aspirin 81 MG tablet Take 81 mg by mouth daily.    . cloNIDine (CATAPRES) 0.2 MG tablet Take 0.2 mg by mouth 2 (two) times daily.    . Difluprednate (DUREZOL) 0.05 % EMUL Apply 1 drop to eye 2 (two) times daily.    . dorzolamide-timolol (COSOPT) 22.3-6.8 MG/ML ophthalmic solution Place 1 drop into the left eye 2 (two) times daily.    . fish oil-omega-3 fatty acids 1000 MG capsule Take 2 g by mouth daily.    Marland Kitchen ketoconazole (NIZORAL) 2 % shampoo Apply 1 application topically 2 (two) times a week. No specific days    . labetalol (NORMODYNE) 300 MG tablet Take 600 mg by mouth 2 (two)  times daily.     Marland Kitchen levothyroxine (SYNTHROID, LEVOTHROID) 75 MCG tablet Take 75 mcg by mouth daily before breakfast.    . lisinopril (PRINIVIL,ZESTRIL) 5 MG tablet Take 5 mg by mouth daily.    . minoxidil (LONITEN) 2.5 MG tablet Take 2.5 mg by mouth 2 (two) times daily.     Marland Kitchen omeprazole (PRILOSEC) 20 MG capsule Take 20 mg by mouth daily.    . phenytoin (DILANTIN) 100 MG ER capsule Take 300 mg by mouth at bedtime.     . prednisoLONE acetate (PRED FORTE) 1 % ophthalmic suspension Place 1 drop into the left eye 2 (two) times daily.    Marland Kitchen PRESCRIPTION MEDICATION Take 1 capsule by mouth daily. Vitamin D3 2000 i.u. Capsule    . sodium polystyrene  (KAYEXALATE) 15 GM/60ML suspension Take 60 g by mouth See admin instructions. Drink 60gm (233ml) by mouth once a week. No specific day.     No current facility-administered medications on file prior to visit.   Allergies  Allergen Reactions  . Penicillins Itching and Rash    ROS: See HPI for pertinent positives and negatives.  Physical Examination  Filed Vitals:   11/22/15 1106  BP: 135/83  Pulse: 73  Temp: 98.3 F (36.8 C)  Resp: 16  Height: 5' 8.5" (1.74 m)  Weight: 148 lb (67.132 kg)  SpO2: 96%   Body mass index is 22.17 kg/(m^2).  General: A&O x 3, WD, walks with cane in right hand, gait is stacato but seems steady.  Pulmonary: Sym exp, good air movt, CTAB, no rales, rhonchi, or wheezing.  Cardiac: RRR, Nl S1, S2, no detected murmur.   Carotid Bruits Left Right   Negative Negative  Aorta is not palpable Radial pulses are 2+ palpable and =.   VASCULAR EXAM:     LE Pulses LEFT RIGHT       FEMORAL Palpable  palpable   POPLITEAL not palpable  not palpable       PT Not palpable faintly palpable       DP faintly palpable Not palpable     Gastrointestinal: soft, NTND, -G/R, - HSM, - palpable masses, - CVAT B.  Musculoskeletal: M/S 5/5 in RUE and RLE, 4/5 in LUE and LLE, Extremities without ischemic changes.  Neurologic: CN 2-12 intact except uvula deviation to the left, Pain and light touch intact in extremities are intact except, Motor exam as listed above.         Medical Decision Making  The patient is a 60 y.o. male who was initially referred after undergoing a workup for renal insufficiency. This included an ultrasound of his kidneys. An incidental finding was a small fusiform infrarenal abdominal aortic aneurysm which measured 2.8 cm in maximum diameter. He is sent for vascular  consultation. He denies any history of abdominal pain or back pain. He does have a history of hypertension but denies any history of diabetes, hypercholesterolemia, history of previous myocardial infarction or history of congestive heart failure. He denies any recent chest pain. He is ambulatory with a walker. He presents with chief complaint: follow up for AAA. His insurance requires an evaluation by a medical provider prior to his AAA duplex approval for coverage; he is here today for this.  Previous studies demonstrate an AAA, measuring 2.6 cm. The patient does not have back or abdominal pain referable to AAA.  Unfortunately he smokes, apparently about 3 cigarettes/day; smoking is his primary risk factor for aneurysmal growth. Fortunately his blood pressure is in  good control.    Based on this patient's exam and diagnostic studies, the patient will follow up in 4 weeks  with the following studies: AAA duplex, see me for discussion of results.  Consideration for repair of AAA would be made when the size is 5.5 cm, growth > 1 cm/yr, and symptomatic status.  I emphasized the importance of maximal medical management including strict control of blood pressure, blood glucose, and lipid levels, antiplatelet agents, obtaining regular exercise, and  cessation of smoking.   The patient was given information about AAA including signs, symptoms, treatment, and how to minimize the risk of enlargement and rupture of aneurysms.    The patient was advised to call 911 should the patient experience sudden onset abdominal or back pain.   Thank you for allowing [place] to participate in this patient's care.  Clemon Chambers, RN, MSN, FNP-C [facility] [place] Office: [phone]  Clinic Physician: Scot Dock  11/22/2015, 11:11 AM

## 2015-11-23 ENCOUNTER — Other Ambulatory Visit: Payer: Self-pay | Admitting: *Deleted

## 2015-11-23 DIAGNOSIS — I714 Abdominal aortic aneurysm, without rupture, unspecified: Secondary | ICD-10-CM

## 2016-01-16 ENCOUNTER — Encounter: Payer: Self-pay | Admitting: Family

## 2016-01-22 ENCOUNTER — Ambulatory Visit (HOSPITAL_COMMUNITY)
Admission: RE | Admit: 2016-01-22 | Discharge: 2016-01-22 | Disposition: A | Discharge status: Home / Self Care | Payer: Medicaid Other | Source: Ambulatory Visit | Attending: Family | Admitting: Family

## 2016-01-22 ENCOUNTER — Encounter: Payer: Self-pay | Admitting: Family

## 2016-01-22 ENCOUNTER — Ambulatory Visit (INDEPENDENT_AMBULATORY_CARE_PROVIDER_SITE_OTHER): Payer: Medicaid Other | Admitting: Family

## 2016-01-22 VITALS — BP 120/75 | HR 75 | Temp 97.8°F | Resp 16 | Ht 68.5 in | Wt 150.4 lb

## 2016-01-22 DIAGNOSIS — I714 Abdominal aortic aneurysm, without rupture, unspecified: Secondary | ICD-10-CM

## 2016-01-22 DIAGNOSIS — I723 Aneurysm of iliac artery: Secondary | ICD-10-CM

## 2016-01-22 DIAGNOSIS — I77811 Abdominal aortic ectasia: Secondary | ICD-10-CM | POA: Diagnosis not present

## 2016-01-22 NOTE — Progress Notes (Signed)
[facility] [place]   CC: Follow up Right Common Iliac Artery Aneurysm  History of Present Illness  Kurt Nelson is a 60 y.o. ([birth year]) male patient of Dr. Scot Dock who was initially referred after undergoing a workup for renal insufficiency. This included an ultrasound of his kidneys. An incidental finding was a small fusiform infrarenal abdominal aortic aneurysm which measured 2.8 cm in maximum diameter. He is sent for vascular consultation. He denies any history of abdominal pain or back pain. He does have a history of hypertension but denies any history of diabetes, hypercholesterolemia, history of previous myocardial infarction or history of congestive heart failure. He denies any recent chest pain. He is ambulatory with a walker. He presents with chief complaint: follow up right iliac artery aneurysm. His insurance requires an evaluation by a medical provider prior to his AAA duplex approval for coverage; he had that in March of 2017. Previous studies demonstrate an ectatic AAA, measuring 2.6 cm. The patient does not have back or abdominal pain.  The patient denies claudication in legs with walking. The patient has a positive history of stroke or TIA symptoms at unknown time. Patient is a resident of [facility] in [place].  His PMHX includes CVA, HTN, cocaine abuse in remission, renal failure (not on dialysis), seizure disorder.  Pt Diabetic: No Pt smoker: smokes 3 cigarettes/day  Past Medical History  Diagnosis Date  . Hypertension   . Stroke ([place])   . Seizures ([place])   . ICH (intracerebral hemorrhage) ([place])   . Hyperglycemia   . ETOH abuse   . Cocaine abuse   . Renal disorder     renal failure  . GERD (gastroesophageal reflux disease)   . Anemia, normocytic normochromic   . AAA (abdominal aortic aneurysm) ([place])    History reviewed. No pertinent past surgical history. Social History Social History   Social History  .  Marital Status: Single    Spouse Name: N/A  . Number of Children: N/A  . Years of Education: 12   Occupational History  . Not on file.   Social History Main Topics  . Smoking status: Former Smoker    Quit date: 09/02/2010  . Smokeless tobacco: Never Used  . Alcohol Use: No  . Drug Use: Yes    Special: Codeine     Comment: Abuse  . Sexual Activity: Not on file   Other Topics Concern  . Not on file   Social History Narrative   Family History Family History  Problem Relation Age of Onset  . Hypertension Mother   . Hypertension Sister   . Hypertension Sister     Current Outpatient Prescriptions on File Prior to Visit  Medication Sig Dispense Refill  . amLODipine (NORVASC) 10 MG tablet Take 10 mg by mouth daily.    Marland Kitchen aspirin 81 MG tablet Take 81 mg by mouth daily.    . cloNIDine (CATAPRES) 0.2 MG tablet Take 0.2 mg by mouth 2 (two) times daily.    . Difluprednate (DUREZOL) 0.05 % EMUL Apply 1 drop to eye 2 (two) times daily.    . dorzolamide-timolol (COSOPT) 22.3-6.8 MG/ML ophthalmic solution Place 1 drop into the left eye 2 (two) times daily.    . fish oil-omega-3 fatty acids 1000 MG capsule Take 2 g by mouth daily.    Marland Kitchen labetalol (NORMODYNE) 300 MG tablet Take 600 mg by mouth 2 (two) times daily.     Marland Kitchen levothyroxine (SYNTHROID, LEVOTHROID) 75 MCG tablet  Take 75 mcg by mouth daily before breakfast.    . lisinopril (PRINIVIL,ZESTRIL) 5 MG tablet Take 5 mg by mouth daily.    . minoxidil (LONITEN) 2.5 MG tablet Take 2.5 mg by mouth 2 (two) times daily.     Marland Kitchen omeprazole (PRILOSEC) 20 MG capsule Take 20 mg by mouth daily.    . phenytoin (DILANTIN) 100 MG ER capsule Take 300 mg by mouth at bedtime.     . prednisoLONE acetate (PRED FORTE) 1 % ophthalmic suspension Place 1 drop into the left eye 2 (two) times daily.    Marland Kitchen PRESCRIPTION MEDICATION Take 1 capsule by mouth daily. Vitamin D3 2000 i.u. Capsule    . sodium polystyrene (KAYEXALATE) 15 GM/60ML suspension Take 60 g by mouth  See admin instructions. Drink 60gm (29ml) by mouth once a week. No specific day.    . ketoconazole (NIZORAL) 2 % shampoo Apply 1 application topically 2 (two) times a week. No specific days     No current facility-administered medications on file prior to visit.   Allergies  Allergen Reactions  . Penicillins Itching and Rash    ROS: See HPI for pertinent positives and negatives.  Physical Examination  Filed Vitals:   01/22/16 1021  BP: 120/75  Pulse: 75  Temp: 97.8 F (36.6 C)  TempSrc: Oral  Resp: 16  Height: 5' 8.5" (1.74 m)  Weight: 150 lb 6.4 oz (68.221 kg)  SpO2: 99%   Body mass index is 22.53 kg/(m^2).  General: A&O x 3, WD, walks with a walker, gait is stacato but seems steady. Speech is edentulous.  Pulmonary: Sym exp, good air movt, CTAB, no rales, rhonchi, or wheezing.  Cardiac: RRR, Nl S1, S2, no detected murmur.   Carotid Bruits Left Right   Negative Negative  Aorta is not palpable Radial pulses are 2+ palpable and =.   VASCULAR EXAM:     LE Pulses LEFT RIGHT   FEMORAL Palpable  palpable   POPLITEAL not palpable  not palpable   PT Not palpable faintly palpable   DP faintly palpable Not palpable     Gastrointestinal: soft, NTND, -G/R, - HSM, - palpable masses, - CVAT B.  Musculoskeletal: M/S 5/5 throughout, Extremities without ischemic changes.  Neurologic: CN 2-12 intact, Pain and light touch intact in extremities are intact except, Motor exam as listed above.                Non-Invasive Vascular Imaging  AAA Duplex (01/22/2016)  Previous size: 2.02 cm, right CIA: 2.15 cm (Date: 11/17/13)  Current size:  2.8 cm, right CIA: 2.2 cm, left CIA: 1.9 cm (Date: 01/22/16)  Medical Decision Making  The patient is a 60 y.o. male who  presents with asymptomatic right CIA aneurysm, stable in size compared to 2015, with limited visualization on duplex. Ectatic small abdominal aortic aneurysm has slightly increased in size, from 2.02 cm to 2.8 cm in 2 years. The patient was counseled re smoking cessation and given several free resources re smoking cessation.   Based on this patient's exam and diagnostic studies, the patient will follow up in 1 year  with the following studies: bilateral aorto-iliac duplex.  Consideration for repair of CIA aneurysm would be at 3.5 cm.  I emphasized the importance of maximal medical management including strict control of blood pressure, blood glucose, and lipid levels, antiplatelet agents, obtaining regular exercise, and  cessation of smoking.   The patient was given information about AAA including signs, symptoms, treatment, and how  to minimize the risk of enlargement and rupture of aneurysms.    The patient was advised to call 911 should the patient experience sudden onset abdominal or back pain.   Thank you for allowing [place] to participate in this patient's care.  Clemon Chambers, RN, MSN, FNP-C [facility] [place] Office: [facility] Physician: Trula Slade  01/22/2016, 10:56 AM

## 2016-04-02 ENCOUNTER — Other Ambulatory Visit: Payer: Self-pay | Admitting: *Deleted

## 2016-04-02 DIAGNOSIS — I714 Abdominal aortic aneurysm, without rupture, unspecified: Secondary | ICD-10-CM

## 2017-01-14 ENCOUNTER — Encounter: Payer: Self-pay | Admitting: Family

## 2017-01-22 ENCOUNTER — Ambulatory Visit (HOSPITAL_COMMUNITY)
Admission: RE | Admit: 2017-01-22 | Discharge: 2017-01-22 | Disposition: A | Discharge status: Home / Self Care | Payer: Medicaid Other | Source: Ambulatory Visit | Attending: Family | Admitting: Family

## 2017-01-22 ENCOUNTER — Encounter: Payer: Self-pay | Admitting: Family

## 2017-01-22 ENCOUNTER — Ambulatory Visit (INDEPENDENT_AMBULATORY_CARE_PROVIDER_SITE_OTHER): Payer: Medicaid Other | Admitting: Family

## 2017-01-22 VITALS — BP 144/86 | HR 65 | Temp 97.4°F | Resp 18 | Ht 68.5 in | Wt 156.0 lb

## 2017-01-22 DIAGNOSIS — I714 Abdominal aortic aneurysm, without rupture, unspecified: Secondary | ICD-10-CM

## 2017-01-22 DIAGNOSIS — I723 Aneurysm of iliac artery: Secondary | ICD-10-CM | POA: Diagnosis not present

## 2017-01-22 DIAGNOSIS — I77811 Abdominal aortic ectasia: Secondary | ICD-10-CM | POA: Diagnosis not present

## 2017-01-22 NOTE — Patient Instructions (Addendum)
Abdominal Aortic Aneurysm Blood pumps away from the heart through tubes (blood vessels) called arteries. Aneurysms are weak or damaged places in the wall of an artery. It bulges out like a balloon. An abdominal aortic aneurysm happens in the main artery of the body (aorta). It can burst or tear, causing bleeding inside the body. This is an emergency. It needs treatment right away. What are the causes? The exact cause is unknown. Things that could cause this problem include:  Fat and other substances building up in the lining of a tube.  Swelling of the walls of a blood vessel.  Certain tissue diseases.  Belly (abdominal) trauma.  An infection in the main artery of the body. What increases the risk? There are things that make it more likely for you to have an aneurysm. These include:  Being over the age of 62 years old.  Having high blood pressure (hypertension).  Being a male.  Being white.  Being very overweight (obese).  Having a family history of aneurysm.  Using tobacco products. What are the signs or symptoms? Symptoms depend on the size of the aneurysm and how fast it grows. There may not be symptoms. If symptoms occur, they can include:  Pain (belly, side, lower back, or groin).  Feeling full after eating a small amount of food.  Feeling sick to your stomach (nauseous), throwing up (vomiting), or both.  Feeling a lump in your belly that feels like it is beating (pulsating).  Feeling like you will pass out (faint). How is this treated?  Medicine to control blood pressure and pain.  Imaging tests to see if the aneurysm gets bigger.  Surgery. How is this prevented? To lessen your chance of getting this condition:  Stop smoking. Stop chewing tobacco.  Limit or avoid alcohol.  Keep your blood pressure, blood sugar, and cholesterol within normal limits.  Eat less salt.  Eat foods low in saturated fats and cholesterol. These are found in animal and whole  dairy products.  Eat more fiber. Fiber is found in whole grains, vegetables, and fruits.  Keep a healthy weight.  Stay active and exercise often. This information is not intended to replace advice given to you by your health care provider. Make sure you discuss any questions you have with your health care provider. Document Released: 12/14/2012 Document Revised: 01/25/2016 Document Reviewed: 09/18/2012 Elsevier Interactive Patient Education  2017 [employer].     Before your next abdominal ultrasound:  Take two Extra-Strength Gas-X capsules at bedtime the night before the test. Take another two Extra-Strength Gas-X capsules 3 hours before the test.  Avoid gas forming foods the day before the test.          Steps to Quit Smoking Smoking tobacco can be bad for your health. It can also affect almost every organ in your body. Smoking puts you and people around you at risk for many serious long-lasting (chronic) diseases. Quitting smoking is hard, but it is one of the best things that you can do for your health. It is never too late to quit. What are the benefits of quitting smoking? When you quit smoking, you lower your risk for getting serious diseases and conditions. They can include:  Lung cancer or lung disease.  Heart disease.  Stroke.  Heart attack.  Not being able to have children (infertility).  Weak bones (osteoporosis) and broken bones (fractures). If you have coughing, wheezing, and shortness of breath, those symptoms may get better when you quit. You  may also get sick less often. If you are pregnant, quitting smoking can help to lower your chances of having a baby of low birth weight. What can I do to help me quit smoking? Talk with your doctor about what can help you quit smoking. Some things you can do (strategies) include:  Quitting smoking totally, instead of slowly cutting back how much you smoke over a period of time.  Going to in-person counseling. You  are more likely to quit if you go to many counseling sessions.  Using resources and support systems, such as:  Online chats with a Social worker.  Phone quitlines.  Printed Furniture conservator/restorer.  Support groups or group counseling.  Text messaging programs.  Mobile phone apps or applications.  Taking medicines. Some of these medicines may have nicotine in them. If you are pregnant or breastfeeding, do not take any medicines to quit smoking unless your doctor says it is okay. Talk with your doctor about counseling or other things that can help you. Talk with your doctor about using more than one strategy at the same time, such as taking medicines while you are also going to in-person counseling. This can help make quitting easier. What things can I do to make it easier to quit? Quitting smoking might feel very hard at first, but there is a lot that you can do to make it easier. Take these steps:  Talk to your family and friends. Ask them to support and encourage you.  Call phone quitlines, reach out to support groups, or work with a Social worker.  Ask people who smoke to not smoke around you.  Avoid places that make you want (trigger) to smoke, such as:  Bars.  Parties.  Smoke-break areas at work.  Spend time with people who do not smoke.  Lower the stress in your life. Stress can make you want to smoke. Try these things to help your stress:  Getting regular exercise.  Deep-breathing exercises.  Yoga.  Meditating.  Doing a body scan. To do this, close your eyes, focus on one area of your body at a time from head to toe, and notice which parts of your body are tense. Try to relax the muscles in those areas.  Download or buy apps on your mobile phone or tablet that can help you stick to your quit plan. There are many free apps, such as QuitGuide from the [employer] Office manager for Disease Control and Prevention). You can find more support from smokefree.gov and other websites. This  information is not intended to replace advice given to you by your health care provider. Make sure you discuss any questions you have with your health care provider. Document Released: 06/15/2009 Document Revised: 04/16/2016 Document Reviewed: 01/03/2015 Elsevier Interactive Patient Education  2017 [employer].

## 2017-01-22 NOTE — Addendum Note (Signed)
Addended by: Lianne Cure A on: 01/22/2017 04:23 PM   Modules accepted: Orders

## 2017-01-22 NOTE — Progress Notes (Signed)
[facility] [place]   CC: Follow up Right Common Iliac Artery Aneurysm and small Abdominal aortic ectasia   History of Present Illness  Kurt Nelson is a 61 y.o. (March 14, 1956) male patient of Dr. Scot Dock who was initially referred after undergoing a workup for renal insufficiency. This included an ultrasound of his kidneys. An incidental finding was a small fusiform infrarenal abdominal aortic aneurysm which measured 2.8 cm in maximum diameter. He was sent for vascular consultation.  He denies any history of abdominal pain or back pain. He does have a history of hypertension but denies any history of diabetes, hypercholesterolemia, history of previous myocardial infarction or history of congestive heart failure. He denies any recent chest pain. He is ambulatory with a walker. He presents with chief complaint: follow up right iliac artery aneurysm.  Previous studies demonstrate an ectatic AAA, measuring 2.6 cm. The patient does not have back or abdominal pain.  The patient denies claudication symptoms in legs with walking. He has weakness in his right leg since his CVA, uses a walker.    Patient is a resident of [facility] in [place].  His PMHX includes CVA, HTN, cocaine abuse in remission, CKD (not on dialysis), seizure disorder.  Diabetic: no                       Pt smoker: smokes 3 cigarettes/day   Past Medical History:  Diagnosis Date  . AAA (abdominal aortic aneurysm) ([place])   . Anemia, normocytic normochromic   . Cocaine abuse   . ETOH abuse   . GERD (gastroesophageal reflux disease)   . Hyperglycemia   . Hypertension   . ICH (intracerebral hemorrhage) ([place])   . Renal disorder    renal failure  . Seizures ([place])   . Stroke [facility])    History reviewed. No pertinent surgical history. Social History Social History   Social History  . Marital status: Single    Spouse name: N/A  . Number of children: N/A  . Years of education:  45   Occupational History  . Not on file.   Social History Main Topics  . Smoking status: Light Tobacco Smoker    Packs/day: 0.25    Last attempt to quit: 09/02/2010  . Smokeless tobacco: Never Used  . Alcohol use No  . Drug use: No     Comment: Abuse  . Sexual activity: Not on file   Other Topics Concern  . Not on file   Social History Narrative  . No narrative on file   Family History Family History  Problem Relation Age of Onset  . Hypertension Mother   . Hypertension Sister   . Hypertension Sister     Current Outpatient Prescriptions on File Prior to Visit  Medication Sig Dispense Refill  . amLODipine (NORVASC) 10 MG tablet Take 10 mg by mouth daily.    Marland Kitchen aspirin 81 MG tablet Take 81 mg by mouth daily.    Marland Kitchen atorvastatin (LIPITOR) 40 MG tablet Take 40 mg by mouth daily.    . cloNIDine (CATAPRES) 0.2 MG tablet Take 0.2 mg by mouth 2 (two) times daily.    . dorzolamide-timolol (COSOPT) 22.3-6.8 MG/ML ophthalmic solution Place 1 drop into the left eye 2 (two) times daily.    . fish oil-omega-3 fatty acids 1000 MG capsule Take 2 g by mouth daily.    Marland Kitchen labetalol (NORMODYNE) 300 MG tablet Take 600 mg by mouth 2 (two) times  daily.     . levothyroxine (SYNTHROID, LEVOTHROID) 75 MCG tablet Take 75 mcg by mouth daily before breakfast.    . minoxidil (LONITEN) 2.5 MG tablet Take 2.5 mg by mouth 2 (two) times daily.     Marland Kitchen omeprazole (PRILOSEC) 20 MG capsule Take 20 mg by mouth daily.    . phenytoin (DILANTIN) 100 MG ER capsule Take 300 mg by mouth at bedtime.     . prednisoLONE acetate (PRED FORTE) 1 % ophthalmic suspension Place 1 drop into the left eye 2 (two) times daily.    . sodium polystyrene (KAYEXALATE) 15 GM/60ML suspension Take 60 g by mouth See admin instructions. Drink 60gm (212ml) by mouth once a week. No specific day.    . Difluprednate (DUREZOL) 0.05 % EMUL Apply 1 drop to eye 2 (two) times daily.    Marland Kitchen ketoconazole (NIZORAL) 2 % shampoo Apply 1 application topically 2  (two) times a week. No specific days    . lisinopril (PRINIVIL,ZESTRIL) 5 MG tablet Take 5 mg by mouth daily.    Marland Kitchen PRESCRIPTION MEDICATION Take 1 capsule by mouth daily. Vitamin D3 2000 i.u. Capsule     No current facility-administered medications on file prior to visit.    Allergies  Allergen Reactions  . Penicillins Itching and Rash    ROS: See HPI for pertinent positives and negatives.  Physical Examination  Vitals:   01/22/17 0934  BP: (!) 144/86  Pulse: 65  Resp: 18  Temp: 97.4 F (36.3 C)  TempSrc: Oral  SpO2: 97%  Weight: 156 lb (70.8 kg)  Height: 5' 8.5" (1.74 m)   Body mass index is 23.37 kg/m.  General: A&O x 3, WD, walks with a walker, gait is stacato but seems steady.   Pulmonary: Sym exp, respirations are non labored, good air movt, CTAB, no rales, rhonchi, or wheezing.  Cardiac: RRR, Nl S1, S2, no detected murmur.   Carotid Bruits Left Right   Negative Negative   Abdominal aorta is not palpable Radial pulses are 2+ palpable and =.   VASCULAR EXAM:     LE Pulses LEFT RIGHT   FEMORAL Palpable  palpable   POPLITEAL not palpable  not palpable   PT Not palpable not palpable   DP faintly palpable Not palpable     Gastrointestinal: soft, NTND, -G/R, - HSM, - palpable masses, - CVAT B.  Musculoskeletal: M/S 5/5 throughout, Extremities without ischemic changes.  Neurologic: CN 2-12 intact, Pain and light touch intact in extremities are intact except, Motor exam as listed above. Speech is edentulous and difficult to understand.    Non-Invasive Vascular Imaging  AAA Duplex (01/22/2017)  Previous size: 2.8 cm, right CIA: 2.2 cm, left CIA: 1.9 cm (Date: 01/22/16)   Current size:  2.6 cm (Date: 01-22-17), Right CIA: 2.0 cm; Left CIA: 1.3  cm  Decreased visualization of the abdominal vasculature due to overlying bowel gas  Medical Decision Making  The patient is a 62 y.o. male who presents with asymptomatic AAA with no increase in size, based on limited visualization.   Based on this patient's exam and diagnostic studies, the patient will follow up in 18 months  with the following studies: AAA duplex.  Consideration for repair of AAA would be made when the size is 5.0 cm, growth > 1 cm/yr, and symptomatic status.  I emphasized the importance of maximal medical management including strict control of blood pressure, blood glucose, and lipid levels, antiplatelet agents, obtaining regular exercise, and  cessation of  smoking.   The patient was given information about AAA including signs, symptoms, treatment, and how to minimize the risk of enlargement and rupture of aneurysms.    The patient was advised to call 911 should the patient experience sudden onset abdominal or back pain.   Thank you for allowing [place] to participate in this patient's care.  Clemon Chambers, RN, MSN, FNP-C [facility] [place] Office: [phone]  Clinic Physician: Scot Dock  01/22/2017, 9:38 AM

## 2018-07-27 ENCOUNTER — Ambulatory Visit (HOSPITAL_COMMUNITY)
Admission: RE | Admit: 2018-07-27 | Discharge: 2018-07-27 | Disposition: A | Discharge status: Home / Self Care | Payer: Medicaid Other | Source: Ambulatory Visit | Attending: Family | Admitting: Family

## 2018-07-27 ENCOUNTER — Encounter: Payer: Self-pay | Admitting: Family

## 2018-07-27 ENCOUNTER — Other Ambulatory Visit: Payer: Self-pay

## 2018-07-27 ENCOUNTER — Ambulatory Visit (INDEPENDENT_AMBULATORY_CARE_PROVIDER_SITE_OTHER): Payer: Medicaid Other | Admitting: Family

## 2018-07-27 VITALS — BP 146/90 | HR 57 | Temp 97.2°F | Resp 16 | Ht 68.5 in | Wt 153.0 lb

## 2018-07-27 DIAGNOSIS — I723 Aneurysm of iliac artery: Secondary | ICD-10-CM | POA: Diagnosis not present

## 2018-07-27 DIAGNOSIS — F17200 Nicotine dependence, unspecified, uncomplicated: Secondary | ICD-10-CM

## 2018-07-27 DIAGNOSIS — I77811 Abdominal aortic ectasia: Secondary | ICD-10-CM | POA: Insufficient documentation

## 2018-07-27 NOTE — Patient Instructions (Signed)
Before your next abdominal ultrasound:  Avoid gas forming foods and beverages the day before the test.   Take two Extra-Strength Gas-X capsules at bedtime the night before the test. Take another two Extra-Strength Gas-X capsules in the middle of the night if you get up to the restroom, if not, first thing in the morning with water.  Do not chew gum.      Steps to Quit Smoking Smoking tobacco can be bad for your health. It can also affect almost every organ in your body. Smoking puts you and people around you at risk for many serious long-lasting (chronic) diseases. Quitting smoking is hard, but it is one of the best things that you can do for your health. It is never too late to quit. What are the benefits of quitting smoking? When you quit smoking, you lower your risk for getting serious diseases and conditions. They can include:  Lung cancer or lung disease.  Heart disease.  Stroke.  Heart attack.  Not being able to have children (infertility).  Weak bones (osteoporosis) and broken bones (fractures).  If you have coughing, wheezing, and shortness of breath, those symptoms may get better when you quit. You may also get sick less often. If you are pregnant, quitting smoking can help to lower your chances of having a baby of low birth weight. What can I do to help me quit smoking? Talk with your doctor about what can help you quit smoking. Some things you can do (strategies) include:  Quitting smoking totally, instead of slowly cutting back how much you smoke over a period of time.  Going to in-person counseling. You are more likely to quit if you go to many counseling sessions.  Using resources and support systems, such as: ? Database administrator with a Social worker. ? Phone quitlines. ? Careers information officer. ? Support groups or group counseling. ? Text messaging programs. ? Mobile phone apps or applications.  Taking medicines. Some of these medicines may have nicotine in them.  If you are pregnant or breastfeeding, do not take any medicines to quit smoking unless your doctor says it is okay. Talk with your doctor about counseling or other things that can help you.  Talk with your doctor about using more than one strategy at the same time, such as taking medicines while you are also going to in-person counseling. This can help make quitting easier. What things can I do to make it easier to quit? Quitting smoking might feel very hard at first, but there is a lot that you can do to make it easier. Take these steps:  Talk to your family and friends. Ask them to support and encourage you.  Call phone quitlines, reach out to support groups, or work with a Social worker.  Ask people who smoke to not smoke around you.  Avoid places that make you want (trigger) to smoke, such as: ? Bars. ? Parties. ? Smoke-break areas at work.  Spend time with people who do not smoke.  Lower the stress in your life. Stress can make you want to smoke. Try these things to help your stress: ? Getting regular exercise. ? Deep-breathing exercises. ? Yoga. ? Meditating. ? Doing a body scan. To do this, close your eyes, focus on one area of your body at a time from head to toe, and notice which parts of your body are tense. Try to relax the muscles in those areas.  Download or buy apps on your mobile phone or tablet that  can help you stick to your quit plan. There are many free apps, such as QuitGuide from the [employer] Office manager for Disease Control and Prevention). You can find more support from smokefree.gov and other websites.  This information is not intended to replace advice given to you by your health care provider. Make sure you discuss any questions you have with your health care provider. Document Released: 06/15/2009 Document Revised: 04/16/2016 Document Reviewed: 01/03/2015 Elsevier Interactive Patient Education  2018 [place].     Abdominal Aortic Aneurysm Blood pumps away from  the heart through tubes (blood vessels) called arteries. Aneurysms are weak or damaged places in the wall of an artery. It bulges out like a balloon. An abdominal aortic aneurysm happens in the main artery of the body (aorta). It can burst or tear, causing bleeding inside the body. This is an emergency. It needs treatment right away. What are the causes? The exact cause is unknown. Things that could cause this problem include:  Fat and other substances building up in the lining of a tube.  Swelling of the walls of a blood vessel.  Certain tissue diseases.  Belly (abdominal) trauma.  An infection in the main artery of the body.  What increases the risk? There are things that make it more likely for you to have an aneurysm. These include:  Being over the age of 63 years old.  Having high blood pressure (hypertension).  Being a male.  Being white.  Being very overweight (obese).  Having a family history of aneurysm.  Using tobacco products.  What are the signs or symptoms? Symptoms depend on the size of the aneurysm and how fast it grows. There may not be symptoms. If symptoms occur, they can include:  Pain (belly, side, lower back, or groin).  Feeling full after eating a small amount of food.  Feeling sick to your stomach (nauseous), throwing up (vomiting), or both.  Feeling a lump in your belly that feels like it is beating (pulsating).  Feeling like you will pass out (faint).  How is this treated?  Medicine to control blood pressure and pain.  Imaging tests to see if the aneurysm gets bigger.  Surgery. How is this prevented? To lessen your chance of getting this condition:  Stop smoking. Stop chewing tobacco.  Limit or avoid alcohol.  Keep your blood pressure, blood sugar, and cholesterol within normal limits.  Eat less salt.  Eat foods low in saturated fats and cholesterol. These are found in animal and whole dairy products.  Eat more fiber. Fiber is  found in whole grains, vegetables, and fruits.  Keep a healthy weight.  Stay active and exercise often.  This information is not intended to replace advice given to you by your health care provider. Make sure you discuss any questions you have with your health care provider. Document Released: 12/14/2012 Document Revised: 01/25/2016 Document Reviewed: 09/18/2012 Elsevier Interactive Patient Education  2017 [employer].

## 2018-07-27 NOTE — Progress Notes (Signed)
[facility] [place]   CC: Follow up Right Iliac artery  Aneurysm  History of Present Illness  Kurt Nelson is a 62 y.o. (06-05-1956) male who was initially referred to Dr. Scot Dock after undergoing a workup for renal insufficiency. This included an ultrasound of his kidneys. An incidental finding was a small fusiform infrarenal abdominal aortic aneurysm which measured 2.8 cm in maximum diameter. He was sent for vascular consultation.  He denies any history of abdominal pain or back pain. He does have a history of hypertension but denies any history of diabetes, hypercholesterolemia, history of previous myocardial infarction or history of congestive heart failure. He denies any recent chest pain. He is ambulatory with a walker. He presents with chief complaint: follow up right iliac artery aneurysm.  Previous studies demonstrate an ectatic AAA, measuring 2.6 cm. The patient does not have back or abdominal pain.  The patient denies claudication type symptoms in legs with walking. He has weakness in his right leg since his CVA, uses a walker.        Patient is a resident of [facility] in [place].  His PMHX includes remote hx of CVA, HTN, cocaine abuse in remission, CKD (not on dialysis), seizure disorder.  He takes a daily ASA and a statin.   Diabetic: No Tobacco use: smoker  (2-3 cig/day, started in his teens)  Past Medical History:  Diagnosis Date  . AAA (abdominal aortic aneurysm) ([place])   . Anemia, normocytic normochromic   . Cocaine abuse ([place])   . ETOH abuse   . GERD (gastroesophageal reflux disease)   . Hyperglycemia   . Hypertension   . ICH (intracerebral hemorrhage) ([place])   . Renal disorder    renal failure  . Seizures ([place])   . Stroke [facility])    No past surgical history on file. Social History Social History   Socioeconomic History  . Marital status: Single    Spouse name: Not on file  . Number of children: Not on file   . Years of education: 7  . Highest education level: Not on file  Occupational History  . Not on file  Social Needs  . Financial resource strain: Not on file  . Food insecurity:    Worry: Not on file    Inability: Not on file  . Transportation needs:    Medical: Not on file    Non-medical: Not on file  Tobacco Use  . Smoking status: Light Tobacco Smoker    Packs/day: 0.25  . Smokeless tobacco: Never Used  Substance and Sexual Activity  . Alcohol use: No    Alcohol/week: 0.0 standard drinks  . Drug use: No    Comment: Abuse  . Sexual activity: Not on file  Lifestyle  . Physical activity:    Days per week: Not on file    Minutes per session: Not on file  . Stress: Not on file  Relationships  . Social connections:    Talks on phone: Not on file    Gets together: Not on file    Attends religious service: Not on file    Active member of club or organization: Not on file    Attends meetings of clubs or organizations: Not on file    Relationship status: Not on file  . Intimate partner violence:    Fear of current or ex partner: Not on file    Emotionally abused: Not on file    Physically abused: Not on file  Forced sexual activity: Not on file  Other Topics Concern  . Not on file  Social History Narrative  . Not on file   Family History Family History  Problem Relation Age of Onset  . Hypertension Mother   . Hypertension Sister   . Hypertension Sister     Current Outpatient Medications on File Prior to Visit  Medication Sig Dispense Refill  . amLODipine (NORVASC) 10 MG tablet Take 10 mg by mouth daily.    Marland Kitchen aspirin 81 MG tablet Take 81 mg by mouth daily.    Marland Kitchen atorvastatin (LIPITOR) 40 MG tablet Take 40 mg by mouth daily.    . calcitRIOL (ROCALTROL) 0.5 MCG capsule Take 0.5 mcg by mouth daily.    . Cholecalciferol (D3-1000) 1000 units capsule Take 1,000 Units by mouth daily.    . cloNIDine (CATAPRES) 0.2 MG tablet Take 0.2 mg by mouth 2 (two) times daily.    .  Difluprednate (DUREZOL) 0.05 % EMUL Apply 1 drop to eye 2 (two) times daily.    . dorzolamide-timolol (COSOPT) 22.3-6.8 MG/ML ophthalmic solution Place 1 drop into the left eye 2 (two) times daily.    . fish oil-omega-3 fatty acids 1000 MG capsule Take 2 g by mouth daily.    Marland Kitchen ketoconazole (NIZORAL) 2 % shampoo Apply 1 application topically 2 (two) times a week. No specific days    . labetalol (NORMODYNE) 300 MG tablet Take 600 mg by mouth 2 (two) times daily.     Marland Kitchen levothyroxine (SYNTHROID, LEVOTHROID) 75 MCG tablet Take 75 mcg by mouth daily before breakfast.    . lisinopril (PRINIVIL,ZESTRIL) 5 MG tablet Take 5 mg by mouth daily.    . minoxidil (LONITEN) 2.5 MG tablet Take 2.5 mg by mouth 2 (two) times daily.     Marland Kitchen omeprazole (PRILOSEC) 20 MG capsule Take 20 mg by mouth daily.    . phenytoin (DILANTIN) 100 MG ER capsule Take 300 mg by mouth at bedtime.     . prednisoLONE acetate (PRED FORTE) 1 % ophthalmic suspension Place 1 drop into the left eye 2 (two) times daily.    Marland Kitchen PRESCRIPTION MEDICATION Take 1 capsule by mouth daily. Vitamin D3 2000 i.u. Capsule    . sodium bicarbonate 650 MG tablet Take 650 mg by mouth 2 (two) times daily.    . sodium polystyrene (KAYEXALATE) 15 GM/60ML suspension Take 60 g by mouth See admin instructions. Drink 60gm (267ml) by mouth once a week. No specific day.     No current facility-administered medications on file prior to visit.    Allergies  Allergen Reactions  . Penicillins Itching and Rash    ROS: See HPI for pertinent positives and negatives.  Physical Examination  Vitals:   07/27/18 0950  BP: (!) 146/90  Pulse: (!) 57  Resp: 16  Temp: (!) 97.2 F (36.2 C)  TempSrc: Oral  SpO2: 100%  Weight: 153 lb (69.4 kg)  Height: 5' 8.5" (1.74 m)   Body mass index is 22.93 kg/m.  General: A&O x 3, WD, male. Assistant with pt.  HEENT: Left eyelid ptosis  Pulmonary: Sym exp, respirations are non labored, fair air movement  In all fields, no rales,  rhonchi, or wheezes.  Cardiac: Regular rhythm and rate, no detected murmur.  Carotid Bruits Right Left   Negative Negative   Adominal aortic pulse is not palpable Radial pulses are 1+ palpable  VASCULAR EXAM:                                                                                                         LE Pulses Right Left       FEMORAL   palpable   palpable        POPLITEAL  not palpable   not palpable       POSTERIOR TIBIAL  not palpable   not palpable        DORSALIS PEDIS      ANTERIOR TIBIAL not palpable  faintly palpable     Gastrointestinal: soft, NTND, -G/R, - HSM, - masses palpated, - CVAT B. Musculoskeletal: M/S 5/5 throughout except 4/5 in right LE, Extremities without ischemic changes. Skin: No rashes, no ulcers, no cellulitis.   Neurologic: CN 2-12 intact, Pain and light touch intact in extremities are intact, Motor exam as listed above.Speech is edentulous and difficult to understand Psychiatric: Normal thought content, mood appropriate to clinical situation.     DATA  AAA Duplex (07/27/2018):  Previous size: 2.6 cm (Date: 01-22-17), Right CIA: 2.0 cm; Left CIA: 1.3 cm. Decreased visualization of the abdominal vasculature due to overlying bowel gas  Current size:  3.00 cm (Date: 07-27-18); Right CIA: 2.4 cm; Left CIA: 1.2 cm  Medical Decision Making  The patient is a 62 y.o. male who presents with asymptomatic AAA with increase in size of AAA to 3.0 cm and right CIA to 2.4 cm..  His aneurysmal risk factors include active smoking since his teens, CKD,  and history of cocaine abuse.    Based on this patient's exam and diagnostic studies, the patient will follow up in 1 year with the following studies: aortoiliac duplex bilateral.  Consideration for repair of AAA would be made when the size is 5.0 cm, growth > 1 cm/yr, and symptomatic status.        The patient was given information about AAA including signs, symptoms,  treatment, and how to minimize the risk of enlargement and rupture of aneurysms.    I emphasized the importance of maximal medical management including strict control of blood pressure, blood glucose, and lipid levels, antiplatelet agents, obtaining regular exercise, and  cessation of smoking.   The patient was advised to call 911 should the patient experience sudden onset abdominal or back pain.   Thank you for allowing [place] to participate in this patient's care.  Clemon Chambers, RN, MSN, FNP-C [facility] [place] Office: [phone]  Clinic Physician: Trula Slade  07/27/2018, 10:12 AM

## 2019-11-24 DIAGNOSIS — E875 Hyperkalemia: Secondary | ICD-10-CM | POA: Insufficient documentation

## 2019-11-24 DIAGNOSIS — N184 Chronic kidney disease, stage 4 (severe): Secondary | ICD-10-CM | POA: Insufficient documentation

## 2019-12-08 ENCOUNTER — Telehealth: Payer: Self-pay | Admitting: Vascular Surgery

## 2019-12-08 NOTE — Telephone Encounter (Signed)
Medicaid denied AAA  U/S for DOS 12/21/19 called the facility and spoke with Trudy to R/S his appt.  She states he is having no issues R/S patient to 01/18/20

## 2019-12-21 ENCOUNTER — Other Ambulatory Visit (HOSPITAL_COMMUNITY): Payer: Medicaid Other

## 2019-12-21 ENCOUNTER — Ambulatory Visit: Payer: Medicaid Other

## 2020-01-13 ENCOUNTER — Other Ambulatory Visit: Payer: Self-pay | Admitting: *Deleted

## 2020-01-13 DIAGNOSIS — I77811 Abdominal aortic ectasia: Secondary | ICD-10-CM

## 2020-01-13 NOTE — Addendum Note (Signed)
Addended by: Leota Jacobsen on: 01/13/2020 04:02 PM   Modules accepted: Orders

## 2020-01-18 ENCOUNTER — Other Ambulatory Visit: Payer: Self-pay

## 2020-01-18 ENCOUNTER — Ambulatory Visit (HOSPITAL_COMMUNITY)
Admission: RE | Admit: 2020-01-18 | Discharge: 2020-01-18 | Disposition: A | Discharge status: Home / Self Care | Payer: Medicaid Other | Source: Ambulatory Visit | Attending: Vascular Surgery | Admitting: Vascular Surgery

## 2020-01-18 ENCOUNTER — Ambulatory Visit (INDEPENDENT_AMBULATORY_CARE_PROVIDER_SITE_OTHER): Payer: Medicaid Other | Admitting: Physician Assistant

## 2020-01-18 VITALS — BP 156/90 | HR 72 | Temp 97.8°F | Resp 20 | Ht 68.5 in | Wt 152.0 lb

## 2020-01-18 DIAGNOSIS — I77811 Abdominal aortic ectasia: Secondary | ICD-10-CM

## 2020-01-18 NOTE — Progress Notes (Signed)
History of Present Illness:  Patient is a 64 y.o. year old male who presents for evaluation of abdominal aortic aneurysm.  The aneurysm is currently 3.0 cm in diameter by US/CT performed at [facility] on11/25/2019 .  The patient denies abdominal pain.  The patient denies back pain.  The patient hadenies family history of AAA.  Other medical problems include   Patient is a resident of [facility] in [place].  His PMHX includes remote hx of CVA, HTN, cocaine abuse in remission,CKD(not on dialysis), seizure disorder.  He takes a daily ASA and a statin.  Past Medical History:  Diagnosis Date  . AAA (abdominal aortic aneurysm) ([place])   . Anemia, normocytic normochromic   . Cocaine abuse ([place])   . ETOH abuse   . GERD (gastroesophageal reflux disease)   . Hyperglycemia   . Hypertension   . ICH (intracerebral hemorrhage) ([place])   . Renal disorder    renal failure  . Seizures ([place])   . Stroke [facility])     History reviewed. No pertinent surgical history.   Social History Social History   Tobacco Use  . Smoking status: Light Tobacco Smoker    Packs/day: 0.25  . Smokeless tobacco: Never Used  Substance Use Topics  . Alcohol use: No    Alcohol/week: 0.0 standard drinks  . Drug use: No    Comment: Abuse    Family History Family History  Problem Relation Age of Onset  . Hypertension Mother   . Hypertension Sister   . Hypertension Sister     Allergies  Allergies  Allergen Reactions  . Penicillins Itching and Rash     Current Outpatient Medications  Medication Sig Dispense Refill  . amLODipine (NORVASC) 10 MG tablet Take 10 mg by mouth daily.    Marland Kitchen aspirin 81 MG tablet Take 81 mg by mouth daily.    Marland Kitchen atorvastatin (LIPITOR) 40 MG tablet Take 40 mg by mouth daily.    . calcitRIOL (ROCALTROL) 0.5 MCG capsule Take 0.5 mcg by mouth daily.    . Cholecalciferol (D3-1000) 1000 units capsule Take 1,000 Units by mouth daily.    . cloNIDine (CATAPRES) 0.2 MG  tablet Take 0.2 mg by mouth 2 (two) times daily.    . Difluprednate (DUREZOL) 0.05 % EMUL Apply 1 drop to eye 2 (two) times daily.    . dorzolamide-timolol (COSOPT) 22.3-6.8 MG/ML ophthalmic solution Place 1 drop into the left eye 2 (two) times daily.    . fish oil-omega-3 fatty acids 1000 MG capsule Take 2 g by mouth daily.    Marland Kitchen ketoconazole (NIZORAL) 2 % shampoo Apply 1 application topically 2 (two) times a week. No specific days    . labetalol (NORMODYNE) 300 MG tablet Take 600 mg by mouth 2 (two) times daily.     Marland Kitchen levothyroxine (SYNTHROID, LEVOTHROID) 75 MCG tablet Take 75 mcg by mouth daily before breakfast.    . lisinopril (PRINIVIL,ZESTRIL) 5 MG tablet Take 5 mg by mouth daily.    . minoxidil (LONITEN) 2.5 MG tablet Take 2.5 mg by mouth 2 (two) times daily.     Marland Kitchen omeprazole (PRILOSEC) 20 MG capsule Take 20 mg by mouth daily.    . phenytoin (DILANTIN) 100 MG ER capsule Take 300 mg by mouth at bedtime.     . prednisoLONE acetate (PRED FORTE) 1 % ophthalmic suspension Place 1 drop into the left eye 2 (two) times daily.    Marland Kitchen PRESCRIPTION MEDICATION Take 1 capsule by  mouth daily. Vitamin D3 2000 i.u. Capsule    . sodium bicarbonate 650 MG tablet Take 650 mg by mouth 2 (two) times daily.    . sodium polystyrene (KAYEXALATE) 15 GM/60ML suspension Take 60 g by mouth See admin instructions. Drink 60gm (274ml) by mouth once a week. No specific day.     No current facility-administered medications for this visit.    ROS:   General:  No weight loss, Fever, chills  HEENT: No recent headaches, no nasal bleeding, no visual changes, no sore throat  Neurologic: No dizziness, blackouts, seizures. No recent symptoms of stroke or mini- stroke. No recent episodes of slurred speech, or temporary blindness.  Cardiac: No recent episodes of chest pain/pressure, no shortness of breath at rest.  No shortness of breath with exertion.  Denies history of atrial fibrillation or irregular heartbeat  Vascular:  No history of rest pain in feet.  No history of claudication.  No history of non-healing ulcer, No history of DVT   Pulmonary: No home oxygen, no productive cough, no hemoptysis,  No asthma or wheezing  Musculoskeletal:  [ ]  Arthritis, [ ]  Low back pain,  [ ]  Joint pain  Hematologic:No history of hypercoagulable state.  No history of easy bleeding.  No history of anemia  Gastrointestinal: No hematochezia or melena,  No gastroesophageal reflux, no trouble swallowing  Urinary: [ ]  chronic Kidney disease, [ ]  on HD - [ ]  MWF or [ ]  TTHS, [ ]  Burning with urination, [ ]  Frequent urination, [ ]  Difficulty urinating;   Skin: No rashes  Psychological: No history of anxiety,  No history of depression   Physical Examination  Vitals:   01/18/20 0832  BP: (!) 156/90  Pulse: 72  Resp: 20  Temp: 97.8 F (36.6 C)  SpO2: 95%  Weight: 152 lb (68.9 kg)  Height: 5' 8.5" (1.74 m)    Body mass index is 22.78 kg/m.  General:  Alert and oriented, no acute distress HEENT: Normal Neck: No bruit or JVD Pulmonary: Clear to auscultation bilaterally Cardiac: Regular Rate and Rhythm without murmur Gastrointestinal: Soft, non-tender, non-distended, no mass, no scars Skin: No rash Extremity Pulses:  2+ radial, brachial, femoral, feet warm well perfused, no Musculoskeletal: No deformity or edema  Neurologic: Upper and lower extremity motor 5/5 and symmetric  DATA:  AAA ultrasound   Abdominal Aorta Findings:  +-----------+-------+----------+----------+--------+--------+--------+  Location  AP (cm)Trans (cm)PSV (cm/s)WaveformThrombusComments  +-----------+-------+----------+----------+--------+--------+--------+  Proximal  2.27  2.45   76                  +-----------+-------+----------+----------+--------+--------+--------+  Mid    2.55  2.45   41                    +-----------+-------+----------+----------+--------+--------+--------+  Distal   3.05  3.05   48                  +-----------+-------+----------+----------+--------+--------+--------+  RT CIA Prox2.1  2.1    89                  +-----------+-------+----------+----------+--------+--------+--------+  LT CIA Prox1.3  1.3    82                  +-----------+-------+----------+----------+--------+--------+--------+          Summary:  Abdominal Aorta: There is evidence of abnormal dilatation of the distal  Abdominal aorta. There is evidence of abnormal dilation of the Right  Common Iliac artery. The largest aortic  diameter remains essentially  unchanged compared to prior exam. Previous  AAA diameter measurement was 3.0 cm obtained on 07/27/2018. The previous  right common iliac artery measurement was 2.4 cm.      ASSESSMENT:  Asymptomatic AAA 3.0 without change from 1 year ago  PLAN: He is asymptomatic without lumbar or abdominal pain.  He will f/u with repeat duplex in 1 year.  Roxy Horseman  [facility] Office: [phone]  MD in clinic [place]

## 2020-01-19 ENCOUNTER — Other Ambulatory Visit: Payer: Self-pay | Admitting: *Deleted

## 2020-01-19 DIAGNOSIS — I77811 Abdominal aortic ectasia: Secondary | ICD-10-CM

## 2021-01-17 ENCOUNTER — Other Ambulatory Visit: Payer: Self-pay

## 2021-01-17 ENCOUNTER — Ambulatory Visit (HOSPITAL_COMMUNITY)
Admission: RE | Admit: 2021-01-17 | Discharge: 2021-01-17 | Disposition: A | Discharge status: Home / Self Care | Payer: Medicare Other | Source: Ambulatory Visit | Attending: Vascular Surgery | Admitting: Vascular Surgery

## 2021-01-17 ENCOUNTER — Ambulatory Visit (INDEPENDENT_AMBULATORY_CARE_PROVIDER_SITE_OTHER): Payer: Medicare Other | Admitting: Physician Assistant

## 2021-01-17 VITALS — BP 142/84 | HR 60 | Temp 98.2°F | Resp 20 | Ht 68.5 in | Wt 133.7 lb

## 2021-01-17 DIAGNOSIS — I77811 Abdominal aortic ectasia: Secondary | ICD-10-CM | POA: Insufficient documentation

## 2021-01-17 NOTE — Progress Notes (Signed)
Peripheral Arterial Disease Follow-Up   VASCULAR SURGERY ASSESSMENT & PLAN:   Kurt Nelson is a 65 y.o. male  AAA: No significant change in maximal abdominal aortic diameter.  The patient was out abdominal or back pain.  Patient is without claudication or rest pain.  Incidental finding of pericardial effusion on today's aorta ultrasound.  BP slightly elevated. Heart rate and rhythm regular.  Patient without shortness of breath or distress.  Notifed patient's PCP, Thomasene Lot, NP.  [facility]written [facility] as well. Continue optimal medical management of diabetes, hypertension and follow-up with primary care physician. Encouraged complete smoking cessation. Continue the following medications: aspirin and statin Follow-up in year with aorta ultrasound.  SUBJECTIVE:   The patient denies episodic significant abdominal or back pain.  He denies chest pain, shortness of breath or orthopnea.  The patient denies lower extremity pain with exercise or rest pain.  Denies skin loss or ulceration.  PHYSICAL EXAM:   Vitals:   01/17/21 0937  BP: (!) 142/84  Pulse: 60  Resp: 20  Temp: 98.2 F (36.8 C)  TempSrc: Temporal  SpO2: 99%  Weight: 133 lb 11.2 oz (60.6 kg)  Height: 5' 8.5" (1.74 m)    General appearance: Well-developed, well-nourished in no apparent distress Neurologic: Alert and oriented x 4. Cardiovascular: Heart rate and rhythm are regular.  No carotid bruits. Abdomen: No palpable pulsatile mass. Extremities: Skin intact.  Both feet are warm and well perfused.  Motor function and sensation intact Pulse exam: 2+ femoral, radial pulses bilaterally   NON-INVASIVE VASCULAR STUDIES   01/17/2021 Abdominal Aorta: There is evidence of abnormal dilatation of the distal  Abdominal aorta. There is evidence of abnormal dilation of the Right  Common Iliac artery. Prior right common iliac diameters on 01/18/2020 was  2.1 cm . Incidental finding  of  significant fluid around the heart. The largest aortic diameter remains  essentially unchanged compared to prior exam. Previous diameter  measurement was 3.1 cm obtained on 01/18/2020.    AAA diameter measurement was 3.05 cm obtained on 01/18/2020. The previous  right common iliac artery measurement was 2.1 cm.     PROBLEM LIST:    The patient's past medical history, past surgical history, family history, social history, allergy list and medication list are reviewed.   CURRENT MEDS:    Current Outpatient Medications:  .  amLODipine (NORVASC) 10 MG tablet, Take 10 mg by mouth daily., Disp: , Rfl:  .  aspirin 81 MG tablet, Take 81 mg by mouth daily., Disp: , Rfl:  .  atorvastatin (LIPITOR) 40 MG tablet, Take 40 mg by mouth daily., Disp: , Rfl:  .  calcitRIOL (ROCALTROL) 0.5 MCG capsule, Take 0.5 mcg by mouth daily., Disp: , Rfl:  .  Cholecalciferol 25 MCG (1000 UT) capsule, Take 1,000 Units by mouth daily., Disp: , Rfl:  .  cloNIDine (CATAPRES) 0.2 MG tablet, Take 0.2 mg by mouth 2 (two) times daily., Disp: , Rfl:  .  Difluprednate 0.05 % EMUL, Apply 1 drop to eye 2 (two) times daily., Disp: , Rfl:  .  dorzolamide-timolol (COSOPT) 22.3-6.8 MG/ML ophthalmic solution, Place 1 drop into the left eye 2 (two) times daily., Disp: , Rfl:  .  fish oil-omega-3 fatty acids 1000 MG capsule, Take 2 g by mouth daily., Disp: , Rfl:  .  ketoconazole (NIZORAL) 2 % shampoo, Apply 1 application topically 2 (two) times a week. No specific days, Disp: , Rfl:  .  labetalol (NORMODYNE) 300 MG tablet, Take 600 mg by mouth 2 (two) times daily. , Disp: , Rfl:  .  levothyroxine (SYNTHROID, LEVOTHROID) 75 MCG tablet, Take 75 mcg by mouth daily before breakfast., Disp: , Rfl:  .  lisinopril (PRINIVIL,ZESTRIL) 5 MG tablet, Take 5 mg by mouth daily., Disp: , Rfl:  .  minoxidil (LONITEN) 2.5 MG tablet, Take 2.5 mg by mouth 2 (two) times daily. , Disp: , Rfl:  .  omeprazole (PRILOSEC) 20 MG capsule, Take 20 mg by  mouth daily., Disp: , Rfl:  .  phenytoin (DILANTIN) 100 MG ER capsule, Take 300 mg by mouth at bedtime. , Disp: , Rfl:  .  prednisoLONE acetate (PRED FORTE) 1 % ophthalmic suspension, Place 1 drop into the left eye 2 (two) times daily., Disp: , Rfl:  .  PRESCRIPTION MEDICATION, Take 1 capsule by mouth daily. Vitamin D3 2000 i.u. Capsule, Disp: , Rfl:  .  sodium bicarbonate 650 MG tablet, Take 650 mg by mouth 2 (two) times daily., Disp: , Rfl:  .  sodium polystyrene (KAYEXALATE) 15 GM/60ML suspension, Take 60 g by mouth See admin instructions. Drink 60gm (235m) by mouth once a week. No specific day., Disp: , Rfl:    REVIEW OF SYSTEMS:   '[X]'$  denotes positive finding, '[ ]'$  denotes negative finding Cardiac  Comments:  Chest pain or chest pressure:    Shortness of breath upon exertion:    Short of breath when lying flat:    Irregular heart rhythm:        Vascular    Pain in calf, thigh, or hip brought on by ambulation:    Pain in feet at night that wakes you up from your sleep:     Blood clot in your veins:    Leg swelling:         Pulmonary    Oxygen at home:    Productive cough:     Wheezing:         Neurologic    Sudden weakness in arms or legs:     Sudden numbness in arms or legs:     Sudden onset of difficulty speaking or slurred speech:    Temporary loss of vision in one eye:     Problems with dizziness:         Gastrointestinal    Blood in stool:     Vomited blood:         Genitourinary    Burning when urinating:     Blood in urine:        Psychiatric    Major depression:         Hematologic    Bleeding problems:    Problems with blood clotting too easily:        Skin    Rashes or ulcers:        Constitutional    Fever or chills:     SBarbie Banner PA-C  Office: [phone]/18/2022

## 2021-05-26 ENCOUNTER — Emergency Department (HOSPITAL_COMMUNITY): Payer: Medicare Other

## 2021-05-26 ENCOUNTER — Encounter (HOSPITAL_COMMUNITY): Payer: Self-pay | Admitting: Emergency Medicine

## 2021-05-26 ENCOUNTER — Other Ambulatory Visit: Payer: Self-pay

## 2021-05-26 ENCOUNTER — Inpatient Hospital Stay (HOSPITAL_COMMUNITY)
Admission: EM | Admit: 2021-05-26 | Discharge: 2021-06-01 | DRG: 291 | Payer: Medicare Other | Source: Skilled Nursing Facility | Attending: Internal Medicine | Admitting: Internal Medicine

## 2021-05-26 DIAGNOSIS — R0902 Hypoxemia: Secondary | ICD-10-CM | POA: Diagnosis present

## 2021-05-26 DIAGNOSIS — I3139 Other pericardial effusion (noninflammatory): Secondary | ICD-10-CM

## 2021-05-26 DIAGNOSIS — I5031 Acute diastolic (congestive) heart failure: Secondary | ICD-10-CM | POA: Diagnosis not present

## 2021-05-26 DIAGNOSIS — D631 Anemia in chronic kidney disease: Secondary | ICD-10-CM | POA: Diagnosis present

## 2021-05-26 DIAGNOSIS — F17210 Nicotine dependence, cigarettes, uncomplicated: Secondary | ICD-10-CM | POA: Diagnosis present

## 2021-05-26 DIAGNOSIS — I10 Essential (primary) hypertension: Secondary | ICD-10-CM | POA: Diagnosis not present

## 2021-05-26 DIAGNOSIS — I161 Hypertensive emergency: Secondary | ICD-10-CM | POA: Diagnosis not present

## 2021-05-26 DIAGNOSIS — Z8673 Personal history of transient ischemic attack (TIA), and cerebral infarction without residual deficits: Secondary | ICD-10-CM | POA: Diagnosis not present

## 2021-05-26 DIAGNOSIS — Z8249 Family history of ischemic heart disease and other diseases of the circulatory system: Secondary | ICD-10-CM

## 2021-05-26 DIAGNOSIS — I509 Heart failure, unspecified: Secondary | ICD-10-CM | POA: Diagnosis not present

## 2021-05-26 DIAGNOSIS — Z7982 Long term (current) use of aspirin: Secondary | ICD-10-CM | POA: Diagnosis not present

## 2021-05-26 DIAGNOSIS — Z20822 Contact with and (suspected) exposure to covid-19: Secondary | ICD-10-CM | POA: Diagnosis present

## 2021-05-26 DIAGNOSIS — Z79899 Other long term (current) drug therapy: Secondary | ICD-10-CM | POA: Diagnosis not present

## 2021-05-26 DIAGNOSIS — I130 Hypertensive heart and chronic kidney disease with heart failure and stage 1 through stage 4 chronic kidney disease, or unspecified chronic kidney disease: Principal | ICD-10-CM | POA: Diagnosis present

## 2021-05-26 DIAGNOSIS — F1010 Alcohol abuse, uncomplicated: Secondary | ICD-10-CM | POA: Diagnosis present

## 2021-05-26 DIAGNOSIS — E859 Amyloidosis, unspecified: Secondary | ICD-10-CM | POA: Diagnosis present

## 2021-05-26 DIAGNOSIS — E039 Hypothyroidism, unspecified: Secondary | ICD-10-CM | POA: Diagnosis not present

## 2021-05-26 DIAGNOSIS — I5043 Acute on chronic combined systolic (congestive) and diastolic (congestive) heart failure: Secondary | ICD-10-CM | POA: Diagnosis not present

## 2021-05-26 DIAGNOSIS — I714 Abdominal aortic aneurysm, without rupture, unspecified: Secondary | ICD-10-CM | POA: Diagnosis present

## 2021-05-26 DIAGNOSIS — F1410 Cocaine abuse, uncomplicated: Secondary | ICD-10-CM | POA: Diagnosis present

## 2021-05-26 DIAGNOSIS — R0602 Shortness of breath: Secondary | ICD-10-CM | POA: Diagnosis present

## 2021-05-26 DIAGNOSIS — N184 Chronic kidney disease, stage 4 (severe): Secondary | ICD-10-CM | POA: Diagnosis present

## 2021-05-26 DIAGNOSIS — I5033 Acute on chronic diastolic (congestive) heart failure: Secondary | ICD-10-CM | POA: Diagnosis present

## 2021-05-26 DIAGNOSIS — Z79890 Hormone replacement therapy (postmenopausal): Secondary | ICD-10-CM

## 2021-05-26 DIAGNOSIS — K219 Gastro-esophageal reflux disease without esophagitis: Secondary | ICD-10-CM | POA: Diagnosis present

## 2021-05-26 DIAGNOSIS — I422 Other hypertrophic cardiomyopathy: Secondary | ICD-10-CM | POA: Diagnosis present

## 2021-05-26 DIAGNOSIS — I313 Pericardial effusion (noninflammatory): Secondary | ICD-10-CM | POA: Diagnosis present

## 2021-05-26 DIAGNOSIS — G40909 Epilepsy, unspecified, not intractable, without status epilepticus: Secondary | ICD-10-CM | POA: Diagnosis present

## 2021-05-26 DIAGNOSIS — E785 Hyperlipidemia, unspecified: Secondary | ICD-10-CM

## 2021-05-26 DIAGNOSIS — E538 Deficiency of other specified B group vitamins: Secondary | ICD-10-CM | POA: Diagnosis present

## 2021-05-26 DIAGNOSIS — I5030 Unspecified diastolic (congestive) heart failure: Secondary | ICD-10-CM | POA: Diagnosis not present

## 2021-05-26 DIAGNOSIS — Z880 Allergy status to penicillin: Secondary | ICD-10-CM

## 2021-05-26 HISTORY — DX: Chronic kidney disease, stage 3 unspecified: N18.30

## 2021-05-26 LAB — CBC WITH DIFFERENTIAL/PLATELET
Abs Immature Granulocytes: 0.02 10*3/uL (ref 0.00–0.07)
Basophils Absolute: 0 10*3/uL (ref 0.0–0.1)
Basophils Relative: 1 %
Eosinophils Absolute: 0.3 10*3/uL (ref 0.0–0.5)
Eosinophils Relative: 5 %
HCT: 34.6 % — ABNORMAL LOW (ref 39.0–52.0)
Hemoglobin: 10.9 g/dL — ABNORMAL LOW (ref 13.0–17.0)
Immature Granulocytes: 0 %
Lymphocytes Relative: 19 %
Lymphs Abs: 1 10*3/uL (ref 0.7–4.0)
MCH: 29.1 pg (ref 26.0–34.0)
MCHC: 31.5 g/dL (ref 30.0–36.0)
MCV: 92.5 fL (ref 80.0–100.0)
Monocytes Absolute: 0.4 10*3/uL (ref 0.1–1.0)
Monocytes Relative: 8 %
Neutro Abs: 3.3 10*3/uL (ref 1.7–7.7)
Neutrophils Relative %: 67 %
Platelets: 216 10*3/uL (ref 150–400)
RBC: 3.74 MIL/uL — ABNORMAL LOW (ref 4.22–5.81)
RDW: 12.7 % (ref 11.5–15.5)
WBC: 5 10*3/uL (ref 4.0–10.5)
nRBC: 0 % (ref 0.0–0.2)

## 2021-05-26 LAB — COMPREHENSIVE METABOLIC PANEL
ALT: 23 U/L (ref 0–44)
AST: 22 U/L (ref 15–41)
Albumin: 3.8 g/dL (ref 3.5–5.0)
Alkaline Phosphatase: 220 U/L — ABNORMAL HIGH (ref 38–126)
Anion gap: 7 (ref 5–15)
BUN: 27 mg/dL — ABNORMAL HIGH (ref 8–23)
CO2: 24 mmol/L (ref 22–32)
Calcium: 9.1 mg/dL (ref 8.9–10.3)
Chloride: 108 mmol/L (ref 98–111)
Creatinine, Ser: 2.47 mg/dL — ABNORMAL HIGH (ref 0.61–1.24)
GFR, Estimated: 28 mL/min — ABNORMAL LOW (ref >60)
Glucose, Bld: 103 mg/dL — ABNORMAL HIGH (ref 70–99)
Potassium: 4.8 mmol/L (ref 3.5–5.1)
Sodium: 139 mmol/L (ref 135–145)
Total Bilirubin: 0.3 mg/dL (ref 0.3–1.2)
Total Protein: 7.7 g/dL (ref 6.5–8.1)

## 2021-05-26 LAB — RESP PANEL BY RT-PCR (FLU A&B, COVID) ARPGX2
Influenza A by PCR: NEGATIVE
Influenza B by PCR: NEGATIVE
SARS Coronavirus 2 by RT PCR: NEGATIVE

## 2021-05-26 LAB — BRAIN NATRIURETIC PEPTIDE: B Natriuretic Peptide: 954 pg/mL — ABNORMAL HIGH (ref 0.0–100.0)

## 2021-05-26 LAB — URINALYSIS, COMPLETE (UACMP) WITH MICROSCOPIC
Bacteria, UA: NONE SEEN
Bilirubin Urine: NEGATIVE
Glucose, UA: NEGATIVE mg/dL
Hgb urine dipstick: NEGATIVE
Ketones, ur: NEGATIVE mg/dL
Leukocytes,Ua: NEGATIVE
Nitrite: NEGATIVE
Protein, ur: 100 mg/dL — AB
Specific Gravity, Urine: 1.012 (ref 1.005–1.030)
pH: 7 (ref 5.0–8.0)

## 2021-05-26 LAB — TROPONIN I (HIGH SENSITIVITY)
Troponin I (High Sensitivity): 25 ng/L — ABNORMAL HIGH (ref <18)
Troponin I (High Sensitivity): 27 ng/L — ABNORMAL HIGH (ref <18)

## 2021-05-26 LAB — HIV ANTIBODY (ROUTINE TESTING W REFLEX): HIV Screen 4th Generation wRfx: NONREACTIVE

## 2021-05-26 MED ORDER — CALCITRIOL 0.25 MCG PO CAPS
0.2500 ug | ORAL_CAPSULE | ORAL | Status: DC
  Administered 2021-05-28 – 2021-06-01 (×2): 0.25 ug via ORAL
  Filled 2021-05-26 (×2): qty 1

## 2021-05-26 MED ORDER — OLOPATADINE HCL 0.1 % OP SOLN
1.0000 [drp] | Freq: Every day | OPHTHALMIC | Status: DC
  Administered 2021-05-27 – 2021-06-01 (×5): 1 [drp] via OPHTHALMIC
  Filled 2021-05-26 (×2): qty 5

## 2021-05-26 MED ORDER — PANTOPRAZOLE SODIUM 40 MG PO TBEC
40.0000 mg | DELAYED_RELEASE_TABLET | Freq: Every day | ORAL | Status: DC
  Administered 2021-05-27 – 2021-06-01 (×6): 40 mg via ORAL
  Filled 2021-05-26 (×6): qty 1

## 2021-05-26 MED ORDER — FUROSEMIDE 10 MG/ML IJ SOLN
60.0000 mg | Freq: Once | INTRAMUSCULAR | Status: AC
  Administered 2021-05-26: 60 mg via INTRAVENOUS
  Filled 2021-05-26: qty 6

## 2021-05-26 MED ORDER — DORZOLAMIDE HCL-TIMOLOL MAL 2-0.5 % OP SOLN
1.0000 [drp] | Freq: Two times a day (BID) | OPHTHALMIC | Status: DC
  Administered 2021-05-26 – 2021-06-01 (×11): 1 [drp] via OPHTHALMIC
  Filled 2021-05-26 (×3): qty 10

## 2021-05-26 MED ORDER — ONDANSETRON HCL 4 MG/2ML IJ SOLN: 4.0000 mg | Freq: Four times a day (QID) | INTRAMUSCULAR | Status: DC | PRN

## 2021-05-26 MED ORDER — LOSARTAN POTASSIUM 25 MG PO TABS
12.5000 mg | ORAL_TABLET | Freq: Every day | ORAL | Status: DC
  Administered 2021-05-26 – 2021-05-29 (×4): 12.5 mg via ORAL
  Filled 2021-05-26 (×4): qty 1

## 2021-05-26 MED ORDER — ACETAMINOPHEN 325 MG PO TABS: 650.0000 mg | ORAL_TABLET | Freq: Four times a day (QID) | ORAL | Status: DC | PRN

## 2021-05-26 MED ORDER — HEPARIN SODIUM (PORCINE) 5000 UNIT/ML IJ SOLN
5000.0000 [IU] | Freq: Three times a day (TID) | INTRAMUSCULAR | Status: DC
  Administered 2021-05-26 – 2021-05-30 (×12): 5000 [IU] via SUBCUTANEOUS
  Filled 2021-05-26 (×12): qty 1

## 2021-05-26 MED ORDER — MINOXIDIL 2.5 MG PO TABS
2.5000 mg | ORAL_TABLET | Freq: Two times a day (BID) | ORAL | Status: DC
  Administered 2021-05-26 – 2021-05-29 (×7): 2.5 mg via ORAL
  Filled 2021-05-26 (×12): qty 1

## 2021-05-26 MED ORDER — ATORVASTATIN CALCIUM 40 MG PO TABS
40.0000 mg | ORAL_TABLET | Freq: Every day | ORAL | Status: DC
  Administered 2021-05-26 – 2021-06-01 (×7): 40 mg via ORAL
  Filled 2021-05-26 (×7): qty 1

## 2021-05-26 MED ORDER — SODIUM CHLORIDE 0.9 % IV SOLN: 250.0000 mL | INTRAVENOUS | Status: DC | PRN

## 2021-05-26 MED ORDER — VITAMIN D 25 MCG (1000 UNIT) PO TABS
1000.0000 [IU] | ORAL_TABLET | Freq: Every day | ORAL | Status: DC
  Administered 2021-05-27 – 2021-06-01 (×6): 1000 [IU] via ORAL
  Filled 2021-05-26 (×6): qty 1

## 2021-05-26 MED ORDER — ONDANSETRON HCL 4 MG PO TABS: 4.0000 mg | ORAL_TABLET | Freq: Four times a day (QID) | ORAL | Status: DC | PRN

## 2021-05-26 MED ORDER — CLONIDINE HCL 0.2 MG PO TABS
0.2000 mg | ORAL_TABLET | Freq: Two times a day (BID) | ORAL | Status: DC
  Administered 2021-05-26 – 2021-05-29 (×7): 0.2 mg via ORAL
  Filled 2021-05-26 (×7): qty 1

## 2021-05-26 MED ORDER — PHENYTOIN SODIUM EXTENDED 100 MG PO CAPS
300.0000 mg | ORAL_CAPSULE | Freq: Every day | ORAL | Status: DC
  Administered 2021-05-26 – 2021-05-31 (×6): 300 mg via ORAL
  Filled 2021-05-26 (×8): qty 3

## 2021-05-26 MED ORDER — LABETALOL HCL 5 MG/ML IV SOLN: 10.0000 mg | INTRAVENOUS | Status: DC | PRN

## 2021-05-26 MED ORDER — SODIUM BICARBONATE 650 MG PO TABS
650.0000 mg | ORAL_TABLET | Freq: Two times a day (BID) | ORAL | Status: DC
  Administered 2021-05-26 – 2021-05-29 (×6): 650 mg via ORAL
  Filled 2021-05-26 (×6): qty 1

## 2021-05-26 MED ORDER — FUROSEMIDE 10 MG/ML IJ SOLN: 40.0000 mg | Freq: Once | INTRAMUSCULAR | Status: DC

## 2021-05-26 MED ORDER — SODIUM CHLORIDE 0.9% FLUSH
3.0000 mL | Freq: Two times a day (BID) | INTRAVENOUS | Status: DC
  Administered 2021-05-26 – 2021-06-01 (×10): 3 mL via INTRAVENOUS

## 2021-05-26 MED ORDER — LEVOTHYROXINE SODIUM 25 MCG PO TABS
125.0000 ug | ORAL_TABLET | Freq: Every day | ORAL | Status: DC
  Administered 2021-05-27 – 2021-06-01 (×6): 125 ug via ORAL
  Filled 2021-05-26 (×6): qty 1

## 2021-05-26 MED ORDER — ASPIRIN 81 MG PO CHEW
81.0000 mg | CHEWABLE_TABLET | Freq: Every day | ORAL | Status: DC
  Administered 2021-05-27 – 2021-06-01 (×6): 81 mg via ORAL
  Filled 2021-05-26 (×6): qty 1

## 2021-05-26 MED ORDER — ACETAMINOPHEN 650 MG RE SUPP: 650.0000 mg | Freq: Four times a day (QID) | RECTAL | Status: DC | PRN

## 2021-05-26 MED ORDER — AMLODIPINE BESYLATE 10 MG PO TABS
10.0000 mg | ORAL_TABLET | Freq: Every day | ORAL | Status: DC
  Administered 2021-05-27 – 2021-06-01 (×6): 10 mg via ORAL
  Filled 2021-05-26 (×2): qty 1
  Filled 2021-05-26: qty 2
  Filled 2021-05-26 (×2): qty 1
  Filled 2021-05-26: qty 2

## 2021-05-26 MED ORDER — SODIUM CHLORIDE 0.9% FLUSH: 3.0000 mL | INTRAVENOUS | Status: DC | PRN

## 2021-05-26 MED ORDER — OMEGA-3-ACID ETHYL ESTERS 1 G PO CAPS
2.0000 g | ORAL_CAPSULE | Freq: Every day | ORAL | Status: DC
  Administered 2021-05-27 – 2021-06-01 (×6): 2 g via ORAL
  Filled 2021-05-26 (×6): qty 2

## 2021-05-26 MED ORDER — FUROSEMIDE 10 MG/ML IJ SOLN
40.0000 mg | Freq: Two times a day (BID) | INTRAMUSCULAR | Status: DC
  Administered 2021-05-26 – 2021-05-27 (×2): 40 mg via INTRAVENOUS
  Filled 2021-05-26 (×2): qty 4

## 2021-05-26 MED ORDER — LABETALOL HCL 200 MG PO TABS
600.0000 mg | ORAL_TABLET | Freq: Two times a day (BID) | ORAL | Status: DC
  Administered 2021-05-26 – 2021-05-29 (×7): 600 mg via ORAL
  Filled 2021-05-26 (×7): qty 3

## 2021-05-26 MED ORDER — LATANOPROST 0.005 % OP SOLN
1.0000 [drp] | Freq: Every day | OPHTHALMIC | Status: DC
  Administered 2021-05-26 – 2021-05-31 (×6): 1 [drp] via OPHTHALMIC
  Filled 2021-05-26 (×3): qty 2.5

## 2021-05-26 NOTE — ED Provider Notes (Signed)
[facility] EMERGENCY DEPARTMENT Provider Note   CSN: [id number] Arrival date & time: 05/26/21  [id number]     History Chief Complaint  Patient presents with   Leg Swelling    Kurt Nelson is a 65 y.o. male presenting from a local nursing home for evaluation of a 2 to 3-day history of bilateral lower extremity edema in association with shortness of breath.  He endorses mild shortness of breath which is worsened with exertion, better at rest.  He denies orthopnea.  He also denies fevers or chills, cough has been nonproductive, denies chest pain, nausea, vomiting, palpitations or dizziness or diaphoresis.  He does have history of hypertension, seizure disorder, history of stroke.  He has a AAA which is being monitored, last seen by vascular specialist 4 months ago.  Also has a history of renal insufficiency, his last creatinine in July was 2.67.  Denies history of CHF.  Review of his MAR indicates he has received his a.m. blood pressure medications this morning including his Lasix 40 mg.  The history is provided by the patient and medical records.      Past Medical History:  Diagnosis Date   AAA (abdominal aortic aneurysm) ([facility])    Anemia, normocytic normochromic    Cocaine abuse ([facility])    ETOH abuse    GERD (gastroesophageal reflux disease)    Hyperglycemia    Hypertension    ICH (intracerebral hemorrhage) ([facility])    Renal disorder    renal failure   Seizures ([place])    Stroke [facility])     Patient Active Problem List   Diagnosis Date Noted   Chronic kidney disease, stage IV (severe) ([place]) 11/24/2019   Hyperkalemia 11/24/2019   AAA (abdominal aortic aneurysm) without rupture ([place]) 11/11/2012   Fx lateral malleolus-closed 10/21/2011   CALCANEAL FRACTURE, LEFT 01/18/2010    No past surgical history on file.     Family History  Problem Relation Age of Onset   Hypertension Mother    Hypertension Sister    Hypertension Sister     Social History   Tobacco Use   Smoking status:  Light Smoker    Packs/day: 0.25    Types: Cigarettes   Smokeless tobacco: Never  Vaping Use   Vaping Use: Never used  Substance Use Topics   Alcohol use: No    Alcohol/week: 0.0 standard drinks   Drug use: No    Comment: Abuse    Home Medications Prior to Admission medications   Medication Sig Start Date End Date Taking? Authorizing Provider  amLODipine (NORVASC) 10 MG tablet Take 10 mg by mouth daily.    [provider]  aspirin 81 MG tablet Take 81 mg by mouth daily.    [provider]  atorvastatin (LIPITOR) 40 MG tablet Take 40 mg by mouth daily.    [provider]  calcitRIOL (ROCALTROL) 0.5 MCG capsule Take 0.5 mcg by mouth daily.    [provider]  Cholecalciferol 25 MCG (1000 UT) capsule Take 1,000 Units by mouth daily.    [provider]  cloNIDine (CATAPRES) 0.2 MG tablet Take 0.2 mg by mouth 2 (two) times daily.    [provider]  Difluprednate 0.05 % EMUL Apply 1 drop to eye 2 (two) times daily.    [provider]  dorzolamide-timolol (COSOPT) 22.3-6.8 MG/ML ophthalmic solution Place 1 drop into the left eye 2 (two) times daily.    [provider]  fish oil-omega-3 fatty acids 1000 MG capsule Take  2 g by mouth daily.    [provider]  ketoconazole (NIZORAL) 2 % shampoo Apply 1 application topically 2 (two) times a week. No specific days    [provider]  labetalol (NORMODYNE) 300 MG tablet Take 600 mg by mouth 2 (two) times daily.     [provider]  levothyroxine (SYNTHROID, LEVOTHROID) 75 MCG tablet Take 75 mcg by mouth daily before breakfast.    [provider]  lisinopril (PRINIVIL,ZESTRIL) 5 MG tablet Take 5 mg by mouth daily.    [provider]  minoxidil (LONITEN) 2.5 MG tablet Take 2.5 mg by mouth 2 (two) times daily.     [provider]  omeprazole (PRILOSEC) 20 MG capsule Take 20 mg by mouth daily.    [provider]   phenytoin (DILANTIN) 100 MG ER capsule Take 300 mg by mouth at bedtime.     [provider]  prednisoLONE acetate (PRED FORTE) 1 % ophthalmic suspension Place 1 drop into the left eye 2 (two) times daily.    [provider]  PRESCRIPTION MEDICATION Take 1 capsule by mouth daily. Vitamin D3 2000 i.u. Capsule    [provider]  sodium bicarbonate 650 MG tablet Take 650 mg by mouth 2 (two) times daily.    [provider]  sodium polystyrene (KAYEXALATE) 15 GM/60ML suspension Take 60 g by mouth See admin instructions. Drink 60gm (27m) by mouth once a week. No specific day.    [provider]    Allergies    Penicillins  Review of Systems   Review of Systems  Constitutional:  Negative for fever.  HENT:  Positive for congestion. Negative for sore throat.   Eyes: Negative.   Respiratory:  Positive for cough and shortness of breath. Negative for chest tightness.   Cardiovascular:  Positive for leg swelling. Negative for chest pain and palpitations.  Gastrointestinal:  Negative for abdominal pain, nausea and vomiting.  Genitourinary: Negative.  Negative for decreased urine volume.  Musculoskeletal:  Negative for arthralgias, joint swelling and neck pain.  Skin: Negative.  Negative for rash and wound.  Neurological:  Negative for dizziness, weakness, light-headedness, numbness and headaches.  Psychiatric/Behavioral: Negative.    All other systems reviewed and are negative.  Physical Exam Updated Vital Signs BP (!) 158/103   Pulse 76   Temp 98.1 F (36.7 C)   Resp 19   Ht '5\' 7"'$  (1.702 m)   Wt 59 kg   SpO2 96%   BMI 20.36 kg/m   Physical Exam Vitals and nursing note reviewed.  Constitutional:      Appearance: He is well-developed.  HENT:     Head: Normocephalic and atraumatic.  Eyes:     Conjunctiva/sclera: Conjunctivae normal.  Cardiovascular:     Rate and Rhythm: Normal rate and regular rhythm.     Heart sounds: Normal heart  sounds.  Pulmonary:     Effort: Pulmonary effort is normal.     Breath sounds: Normal breath sounds. No wheezing.  Abdominal:     General: Bowel sounds are normal.     Palpations: Abdomen is soft.     Tenderness: There is no abdominal tenderness.  Musculoskeletal:        General: Normal range of motion.     Cervical back: Normal range of motion.     Right lower leg: Edema present.     Left lower leg: Edema present.  Skin:    General: Skin is warm and dry.  Neurological:  Mental Status: He is alert.    ED Results / Procedures / Treatments   Labs (all labs ordered are listed, but only abnormal results are displayed) Labs Reviewed  COMPREHENSIVE METABOLIC PANEL - Abnormal; Notable for the following components:      Result Value   Glucose, Bld 103 (*)    BUN 27 (*)    Creatinine, Ser 2.47 (*)    Alkaline Phosphatase 220 (*)    GFR, Estimated 28 (*)    All other components within normal limits  CBC WITH DIFFERENTIAL/PLATELET - Abnormal; Notable for the following components:   RBC 3.74 (*)    Hemoglobin 10.9 (*)    HCT 34.6 (*)    All other components within normal limits  BRAIN NATRIURETIC PEPTIDE - Abnormal; Notable for the following components:   B Natriuretic Peptide 954.0 (*)    All other components within normal limits  URINALYSIS, COMPLETE (UACMP) WITH MICROSCOPIC - Abnormal; Notable for the following components:   Protein, ur 100 (*)    All other components within normal limits  RESP PANEL BY RT-PCR (FLU A&B, COVID) [id number]    EKG EKG Interpretation  Date/Time:  Saturday May 26 2021 11:15:20 EDT Ventricular Rate:  73 PR Interval:  123 QRS Duration: 99 QT Interval:  417 QTC Calculation: 463 R Axis:   45 Text Interpretation: Sinus rhythm Consider right atrial enlargement Repol abnrm suggests ischemia, anterolateral Artifact in lead(s) I II aVR aVL aVF V2 V3 V4 V5 V6 No significant change since last tracing Confirmed by Isla Pence [phone]) on  05/26/2021 12:16:08 PM  Radiology DG Chest Port 1 View  Result Date: 05/26/2021 CLINICAL DATA:  bilateral LE swelling x 2-3 days Denies SHOB. Reports a fall this morning around 2 am reports pain in left knee area. Pt able to get back up on his own and walk after fall EXAM: PORTABLE CHEST 1 VIEW COMPARISON:  09/06/2014. FINDINGS: Marked enlargement of the cardiopericardial silhouette, significantly increased when compared to the prior exam. No mediastinal or hilar masses. Lungs are hyperexpanded with interstitial thickening, interstitial thickening increased compared to prior exams. No lung consolidation. Possible small effusions. No pneumothorax. Skeletal structures grossly intact. IMPRESSION: 1. Marked enlargement of the cardiopericardial silhouette, significantly increased compared to the prior studies. Silhouette configuration raises suspicion for a pericardial effusion. 2. Bilateral interstitial thickening increased from the prior studies suggesting interstitial edema. No evidence of pneumonia. Electronically Signed   By: Lajean Manes M.D.   On: 05/26/2021 11:30    Procedures Procedures   Medications Ordered in ED Medications  furosemide (LASIX) injection 60 mg (60 mg Intravenous Given 05/26/21 1226)    ED Course  I have reviewed the triage vital signs and the nursing notes.  Pertinent labs & imaging results that were available during my care of the patient were reviewed by me and considered in my medical decision making (see chart for details).    MDM Rules/Calculators/A&P                           Labs and imaging reviewed, findings along with his exam is consistent with new onset CHF.  He is significantly hypertensive here as well, despite having received his morning medications prior to arrival including his amlodipine, clonidine,  labetalol lisinopril and his frusemide.  He denies chest pain.  Chest x-ray also suggest possible pericardial effusion, he will benefit from echocardiogram  for further evaluation at this potential complication.  Patient was given  IV Lasix 60 mg.  We will plan admission, he is hypoxic on room air 86-88, he was placed on 2 L [place] and he remains with pulse ox above 95%.  Discussed with Dr. Manuella Ghazi who accepts pt for admission. Final Clinical Impression(s) / ED Diagnoses Final diagnoses:  Acute congestive heart failure, unspecified heart failure type [facility])  Hypoxia    Rx / DC Orders ED Discharge Orders     None        Landis Martins 05/26/21 1310    Isla Pence, MD 05/26/21 1435

## 2021-05-26 NOTE — H&P (Signed)
History and Physical    Kurt Nelson [id number] DOB: 08/13/1956 DOA: 05/26/2021  PCP: [name], MD   Patient coming from: ALF  Chief Complaint: Leg edema  HPI: Kurt Nelson is a 65 y.o. male with medical history significant for AAA, hypertension, GERD, CKD stage IV, and dyslipidemia who was brought to the ED from his nursing facility due to worsening lower extremity edema with mild shortness of breath over the last 2-3 days.  It appears that his shortness of breath has minimally worsened with exertion, but he denies any orthopnea, fevers, chills, chest pain, nausea, vomiting, palpitations, dizziness, or diaphoresis.  He has been compliant with his home medications.   ED Course: Vital signs with hypertension noted.  He was started on Lasix 60 mg IV and creatinine noted to be 2.47 which is consistent with his usual baseline for CKD stage IV.  BNP 954 and chest x-ray concerning for cardiomegaly with possible pericardial effusion.  EKG was sinus rhythm at 73 bpm.  COVID testing negative.  Troponins pending.  Review of Systems: Reviewed as noted above, otherwise negative.  Past Medical History:  Diagnosis Date   AAA (abdominal aortic aneurysm) (HCC)    Anemia, normocytic normochromic    Cocaine abuse (HCC)    ETOH abuse    GERD (gastroesophageal reflux disease)    Hyperglycemia    Hypertension    ICH (intracerebral hemorrhage) (HCC)    Renal disorder    renal failure   Seizures ([place])    Stroke ([place])     No past surgical history on file.   reports that he has been smoking. He has been smoking an average of .25 packs per day. He has never used smokeless tobacco. He reports that he does not drink alcohol and does not use drugs.  Allergies  Allergen Reactions   Penicillins Itching and Rash    Family History  Problem Relation Age of Onset   Hypertension Mother    Hypertension Sister    Hypertension Sister     Prior to Admission medications   Medication Sig Start Date End  Date Taking? Authorizing Provider  amLODipine (NORVASC) 10 MG tablet Take 10 mg by mouth daily.   Yes [provider]  aspirin 81 MG tablet Take 81 mg by mouth daily.   Yes [provider]  atorvastatin (LIPITOR) 40 MG tablet Take 40 mg by mouth daily.   Yes [provider]  Cholecalciferol 25 MCG (1000 UT) capsule Take 1,000 Units by mouth daily.   Yes [provider]  cloNIDine (CATAPRES) 0.2 MG tablet Take 0.2 mg by mouth 2 (two) times daily.   Yes [provider]  dorzolamide-timolol (COSOPT) 22.3-6.8 MG/ML ophthalmic solution Place 1 drop into the left eye 2 (two) times daily.   Yes [provider]  fish oil-omega-3 fatty acids 1000 MG capsule Take 2 g by mouth daily.   Yes [provider]  labetalol (NORMODYNE) 300 MG tablet Take 600 mg by mouth 2 (two) times daily.    Yes [provider]  levothyroxine (SYNTHROID, LEVOTHROID) 75 MCG tablet Take 125 mcg by mouth daily before breakfast.   Yes [provider]  minoxidil (LONITEN) 2.5 MG tablet Take 2.5 mg by mouth 2 (two) times daily.    Yes [provider]  omeprazole (PRILOSEC) 20 MG capsule Take 20 mg by mouth daily.   Yes [provider]  phenytoin (DILANTIN) 100 MG ER capsule Take 300 mg by mouth at bedtime.  Yes [provider]  sodium bicarbonate 650 MG tablet Take 650 mg by mouth 2 (two) times daily.   Yes [provider]  calcitRIOL (ROCALTROL) 0.25 MCG capsule Take by mouth. 04/30/21   [provider]  calcitRIOL (ROCALTROL) 0.5 MCG capsule Take 0.5 mcg by mouth daily. Patient not taking: Reported on 05/26/2021    [provider]  Difluprednate 0.05 % EMUL Apply 1 drop to eye 2 (two) times daily.    [provider]  ketoconazole (NIZORAL) 2 % shampoo Apply 1 application topically 2 (two) times a week. No specific days    [provider]  levothyroxine (SYNTHROID) 125 MCG tablet Take  by mouth.    [provider]  lisinopril (PRINIVIL,ZESTRIL) 5 MG tablet Take 5 mg by mouth daily.    [provider]  losartan (COZAAR) 25 MG tablet Take 12.5 mg by mouth daily. 04/30/21   [provider]  LUMIGAN 0.01 % SOLN SMARTSIG:In Eye(s) 04/30/21   [provider]  olopatadine (PATANOL) 0.1 % ophthalmic solution SMARTSIG:In Eye(s) 04/30/21   [provider]  prednisoLONE acetate (PRED FORTE) 1 % ophthalmic suspension Place 1 drop into the left eye 2 (two) times daily.    [provider]  PRESCRIPTION MEDICATION Take 1 capsule by mouth daily. Vitamin D3 2000 i.u. Capsule    [provider]  sodium polystyrene (KAYEXALATE) 15 GM/60ML suspension Take 60 g by mouth See admin instructions. Drink 60gm (255m) by mouth once a week. No specific day.    [provider]    Physical Exam: Vitals:   05/26/21 1100 05/26/21 1117 05/26/21 1225 05/26/21 1300  BP: (!) 164/110 (!) 160/107 (!) 158/103 (!) 164/93  Pulse: (!) 104 76 76 80  Resp:  '16 19 19  '$ Temp:      SpO2: 100% 99% 96% 99%  Weight:      Height:        Constitutional: NAD, calm, comfortable Vitals:   05/26/21 1100 05/26/21 1117 05/26/21 1225 05/26/21 1300  BP: (!) 164/110 (!) 160/107 (!) 158/103 (!) 164/93  Pulse: (!) 104 76 76 80  Resp:  '16 19 19  '$ Temp:      SpO2: 100% 99% 96% 99%  Weight:      Height:       Eyes: lids and conjunctivae normal Neck: normal, supple Respiratory: clear to auscultation bilaterally. Normal respiratory effort. No accessory muscle use. 2L [place] Cardiovascular: Regular rate and rhythm, no murmurs. Abdomen: no tenderness, no distention. Bowel sounds positive.  Musculoskeletal:  2+ pitting edema to midshin bilaterally Skin: no rashes, lesions, ulcers.  Psychiatric: Flat affect  Labs on Admission: I have personally reviewed following labs and imaging studies  CBC: Recent Labs  Lab 05/26/21 1057  WBC 5.0  NEUTROABS 3.3  HGB  10.9*  HCT 34.6*  MCV 92.5  PLT [id number]  Basic Metabolic Panel: Recent Labs  Lab 05/26/21 1057  NA 139  K 4.8  CL 108  CO2 24  GLUCOSE 103*  BUN 27*  CREATININE 2.47*  CALCIUM 9.1   GFR: Estimated Creatinine Clearance: 24.9 mL/min (A) (by C-G formula based on SCr of 2.47 mg/dL (H)). Liver Function Tests: Recent Labs  Lab 05/26/21 1057  AST 22  ALT 23  ALKPHOS 220*  BILITOT 0.3  PROT 7.7  ALBUMIN 3.8   No results for input(s): LIPASE, AMYLASE in the last 168 hours. No results for input(s): AMMONIA in the last 168 hours. Coagulation Profile: No results for  input(s): INR, PROTIME in the last 168 hours. Cardiac Enzymes: No results for input(s): CKTOTAL, CKMB, CKMBINDEX, TROPONINI in the last 168 hours. BNP (last 3 results) No results for input(s): PROBNP in the last 8760 hours. HbA1C: No results for input(s): HGBA1C in the last 72 hours. CBG: No results for input(s): GLUCAP in the last 168 hours. Lipid Profile: No results for input(s): CHOL, HDL, LDLCALC, TRIG, CHOLHDL, LDLDIRECT in the last 72 hours. Thyroid Function Tests: No results for input(s): TSH, T4TOTAL, FREET4, T3FREE, THYROIDAB in the last 72 hours. Anemia Panel: No results for input(s): VITAMINB12, FOLATE, FERRITIN, TIBC, IRON, RETICCTPCT in the last 72 hours. Urine analysis:    Component Value Date/Time   COLORURINE YELLOW 05/26/2021 1116   APPEARANCEUR CLEAR 05/26/2021 1116   LABSPEC 1.012 05/26/2021 1116   PHURINE 7.0 05/26/2021 1116   GLUCOSEU NEGATIVE 05/26/2021 1116   [place] 05/26/2021 1116   [place] 05/26/2021 1116   KETONESUR NEGATIVE 05/26/2021 1116   PROTEINUR 100 (A) 05/26/2021 1116   UROBILINOGEN 1.0 09/06/2014 1929   NITRITE NEGATIVE 05/26/2021 1116   LEUKOCYTESUR NEGATIVE 05/26/2021 1116    Radiological Exams on Admission: DG Chest Port 1 View  Result Date: 05/26/2021 CLINICAL DATA:  bilateral LE swelling x 2-3 days Denies SHOB. Reports a fall this morning  around 2 am reports pain in left knee area. Pt able to get back up on his own and walk after fall EXAM: PORTABLE CHEST 1 VIEW COMPARISON:  09/06/2014. FINDINGS: Marked enlargement of the cardiopericardial silhouette, significantly increased when compared to the prior exam. No mediastinal or hilar masses. Lungs are hyperexpanded with interstitial thickening, interstitial thickening increased compared to prior exams. No lung consolidation. Possible small effusions. No pneumothorax. Skeletal structures grossly intact. IMPRESSION: 1. Marked enlargement of the cardiopericardial silhouette, significantly increased compared to the prior studies. Silhouette configuration raises suspicion for a pericardial effusion. 2. Bilateral interstitial thickening increased from the prior studies suggesting interstitial edema. No evidence of pneumonia. Electronically Signed   By: Lajean Manes M.D.   On: 05/26/2021 11:30    EKG: Independently reviewed.  Normal sinus rhythm 73 bpm.  Assessment/Plan Active Problems:   Acute CHF (HCC)    New onset acute CHF with possible pericardial effusion -IV Lasix 40 mg twice daily -Strict I's and O's and daily weights -Fluid restriction -2D echocardiogram for further evaluation  Hypertension-uncontrolled -IV labetalol as needed -Continue usual home blood pressure medications and monitor with IV diuresis  CKD stage IIIb -Baseline creatinine between 2-3 -Continue monitor with diuresis  Chronic anemia -Likely related to CKD -Continue to monitor  GERD -Continue PPI  History of AAA with prior CVA -Follows up with vascular surgery outpatient  Hypothyroidism -Continue levothyroxine  Tobacco abuse -Counseled on cessation   DVT prophylaxis: Heparin Code Status: Full Family Communication: None at bedside Disposition Plan: Admit for CHF evaluation/diuresis Consults called:None Admission status: Inpatient, Tele   Poppi Scantling D Ghadeer Kastelic DO [facility]  If 7PM-7AM,  please contact night-coverage [email or web address]  05/26/2021, 1:08 PM

## 2021-05-26 NOTE — ED Triage Notes (Addendum)
Pt presents today via EMS with c/o bilateral LE swelling x 2-3 days Denies SHOB. Reports a fall this morning around 2 am reports pain in left knee area. Pt able to get back up on his own and  walk after fall, notified staff later in the morning.

## 2021-05-27 ENCOUNTER — Inpatient Hospital Stay (HOSPITAL_COMMUNITY): Payer: Medicare Other

## 2021-05-27 DIAGNOSIS — I509 Heart failure, unspecified: Secondary | ICD-10-CM | POA: Diagnosis not present

## 2021-05-27 DIAGNOSIS — I5030 Unspecified diastolic (congestive) heart failure: Secondary | ICD-10-CM

## 2021-05-27 DIAGNOSIS — I313 Pericardial effusion (noninflammatory): Secondary | ICD-10-CM

## 2021-05-27 LAB — BASIC METABOLIC PANEL
Anion gap: 7 (ref 5–15)
BUN: 34 mg/dL — ABNORMAL HIGH (ref 8–23)
CO2: 26 mmol/L (ref 22–32)
Calcium: 8.8 mg/dL — ABNORMAL LOW (ref 8.9–10.3)
Chloride: 108 mmol/L (ref 98–111)
Creatinine, Ser: 2.59 mg/dL — ABNORMAL HIGH (ref 0.61–1.24)
GFR, Estimated: 27 mL/min — ABNORMAL LOW (ref >60)
Glucose, Bld: 84 mg/dL (ref 70–99)
Potassium: 4.4 mmol/L (ref 3.5–5.1)
Sodium: 141 mmol/L (ref 135–145)

## 2021-05-27 LAB — IRON AND TIBC
Iron: 50 ug/dL (ref 45–182)
Saturation Ratios: 18 % (ref 17.9–39.5)
TIBC: 283 ug/dL (ref 250–450)
UIBC: 233 ug/dL

## 2021-05-27 LAB — ECHOCARDIOGRAM COMPLETE
Area-P 1/2: 3.65 cm2
Height: 67 in
S' Lateral: 2.7 cm
Single Plane A4C EF: 71.8 %
Weight: 2056.45 oz

## 2021-05-27 LAB — CBC
HCT: 31.1 % — ABNORMAL LOW (ref 39.0–52.0)
Hemoglobin: 9.8 g/dL — ABNORMAL LOW (ref 13.0–17.0)
MCH: 28.6 pg (ref 26.0–34.0)
MCHC: 31.5 g/dL (ref 30.0–36.0)
MCV: 90.7 fL (ref 80.0–100.0)
Platelets: 208 10*3/uL (ref 150–400)
RBC: 3.43 MIL/uL — ABNORMAL LOW (ref 4.22–5.81)
RDW: 12.6 % (ref 11.5–15.5)
WBC: 4.3 10*3/uL (ref 4.0–10.5)
nRBC: 0 % (ref 0.0–0.2)

## 2021-05-27 LAB — MAGNESIUM: Magnesium: 1.9 mg/dL (ref 1.7–2.4)

## 2021-05-27 LAB — VITAMIN B12: Vitamin B-12: 165 pg/mL — ABNORMAL LOW (ref 180–914)

## 2021-05-27 LAB — RETICULOCYTES
Immature Retic Fract: 9.8 % (ref 2.3–15.9)
RBC.: 3.43 MIL/uL — ABNORMAL LOW (ref 4.22–5.81)
Retic Count, Absolute: 35.3 10*3/uL (ref 19.0–186.0)
Retic Ct Pct: 1 % (ref 0.4–3.1)

## 2021-05-27 LAB — FOLATE: Folate: 6.1 ng/mL (ref >5.9)

## 2021-05-27 LAB — FERRITIN: Ferritin: 24 ng/mL (ref 24–336)

## 2021-05-27 NOTE — Progress Notes (Signed)
Updated Kurt Nelson with [facility] on patient. Pt informed.

## 2021-05-27 NOTE — Progress Notes (Signed)
  Echocardiogram 2D Echocardiogram has been performed.  Matilde Bash 05/27/2021, 11:13 AM

## 2021-05-27 NOTE — Progress Notes (Signed)
Nurse assisted patient out of bed ambulated to couch/chair in room. Changed bed linens and then assisted patient back to bed on RA patient maintained 97% O2sat. MD informed.

## 2021-05-27 NOTE — Progress Notes (Addendum)
PROGRESS NOTE    Kurt Nelson  [id number] DOB: 1956-02-03 DOA: 05/26/2021 PCP: [name], MD   Brief Narrative:   Kurt Nelson is a 65 y.o. male with medical history significant for AAA, hypertension, GERD, CKD stage IIIb, and dyslipidemia who was brought to the ED from his nursing facility due to worsening lower extremity edema with mild shortness of breath over the last 2-3 days.  He has been admitted with new onset acute CHF exacerbation.  2D echocardiogram with noted large pericardial effusion, but no tamponade physiology.  Plans are to transfer to [facility] for further cardiology evaluation and likely pericardiocentesis.  Assessment & Plan:   Active Problems:   Acute CHF ([place])   New onset acute diastolic CHF with large pericardial effusion -IV Lasix 40 mg twice daily now held as patient appears euvolemic -Plan to start p.o. Lasix tomorrow -Strict I's and O's and daily weights -Fluid restriction -2D echocardiogram with grade 2 diastolic dysfunction and LVEF 60 to 65%.  Large pericardial effusion noted for which patient will require pericardiocentesis and further evaluation.  Plan to transfer to [facility].  Discussed with cardiology Dr. Quentin Ore   Hypertension-improved control -IV labetalol as needed -Continue usual home blood pressure medications    CKD stage IIIb -Baseline creatinine between 2-3 -Continue monitor in a.m.   Chronic anemia -Likely related to CKD -Check anemia panel -Continue to monitor   GERD -Continue PPI   History of AAA with prior CVA -Follows up with vascular surgery outpatient   Hypothyroidism -Continue levothyroxine   Tobacco abuse -Counseled on cessation   DVT prophylaxis:Heparin Code Status: Full Family Communication: Discussed with daughter on phone 9/25 Disposition Plan:  Status is: Inpatient  Remains inpatient appropriate because:IV treatments appropriate due to intensity of illness or inability to take PO and Inpatient  level of care appropriate due to severity of illness  Dispo: The patient is from: ALF              Anticipated d/c is to: ALF              Patient currently is not medically stable to d/c.   Difficult to place patient No   Consultants:  Cardiology-Dr. Quentin Ore  Procedures:  See below  Antimicrobials:  None   Subjective: Patient seen and evaluated today with no new acute complaints or concerns.  He is swelling has diminished and he has no significant shortness of breath.  No acute concerns or events noted overnight.  Objective: Vitals:   05/27/21 0300 05/27/21 0530 05/27/21 0848 05/27/21 0907  BP: 134/79 118/74 (!) 145/83 (!) 145/83  Pulse: 85 83 87   Resp: 17 17    Temp: 97.9 F (36.6 C) 97.8 F (36.6 C)    TempSrc: Oral     SpO2: 100% 96%    Weight:  58.3 kg    Height:        Intake/Output Summary (Last 24 hours) at 05/27/2021 0942 Last data filed at 05/27/2021 0700 Gross per 24 hour  Intake 920 ml  Output 2500 ml  Net -1580 ml   Filed Weights   05/26/21 1004 05/26/21 1847 05/27/21 0530  Weight: 59 kg 60.5 kg 58.3 kg    Examination:  General exam: Appears calm and comfortable  Respiratory system: Clear to auscultation. Respiratory effort normal.  Currently on 2 L nasal cannula oxygen Cardiovascular system: S1 & S2 heard, RRR.  Gastrointestinal system: Abdomen is soft Central nervous system: Alert and awake Extremities: Scant bilateral  ankle edema Skin: No significant lesions noted Psychiatry: Flat affect.    Data Reviewed: I have personally reviewed following labs and imaging studies  CBC: Recent Labs  Lab 05/26/21 1057 05/27/21 0505  WBC 5.0 4.3  NEUTROABS 3.3  --   HGB 10.9* 9.8*  HCT 34.6* 31.1*  MCV 92.5 90.7  PLT 216 [id number]   Basic Metabolic Panel: Recent Labs  Lab 05/26/21 1057 05/27/21 0505  NA 139 141  K 4.8 4.4  CL 108 108  CO2 24 26  GLUCOSE 103* 84  BUN 27* 34*  CREATININE 2.47* 2.59*  CALCIUM 9.1 8.8*  MG  --  1.9    GFR: Estimated Creatinine Clearance: 23.4 mL/min (A) (by C-G formula based on SCr of 2.59 mg/dL (H)). Liver Function Tests: Recent Labs  Lab 05/26/21 1057  AST 22  ALT 23  ALKPHOS 220*  BILITOT 0.3  PROT 7.7  ALBUMIN 3.8   No results for input(s): LIPASE, AMYLASE in the last 168 hours. No results for input(s): AMMONIA in the last 168 hours. Coagulation Profile: No results for input(s): INR, PROTIME in the last 168 hours. Cardiac Enzymes: No results for input(s): CKTOTAL, CKMB, CKMBINDEX, TROPONINI in the last 168 hours. BNP (last 3 results) No results for input(s): PROBNP in the last 8760 hours. HbA1C: No results for input(s): HGBA1C in the last 72 hours. CBG: No results for input(s): GLUCAP in the last 168 hours. Lipid Profile: No results for input(s): CHOL, HDL, LDLCALC, TRIG, CHOLHDL, LDLDIRECT in the last 72 hours. Thyroid Function Tests: No results for input(s): TSH, T4TOTAL, FREET4, T3FREE, THYROIDAB in the last 72 hours. Anemia Panel: No results for input(s): VITAMINB12, FOLATE, FERRITIN, TIBC, IRON, RETICCTPCT in the last 72 hours. Sepsis Labs: No results for input(s): PROCALCITON, LATICACIDVEN in the last 168 hours.  Recent Results (from the past 240 hour(s))  Resp Panel by RT-PCR (Flu A&B, Covid) Nasopharyngeal Swab     Status: None   Collection Time: 05/26/21 11:16 AM   Specimen: Nasopharyngeal Swab; Nasopharyngeal(NP) swabs in vial transport medium  Result Value Ref Range Status   SARS Coronavirus 2 by RT PCR NEGATIVE NEGATIVE Final    Comment: (NOTE) SARS-CoV-2 target nucleic acids are NOT DETECTED.  The SARS-CoV-2 RNA is generally detectable in upper respiratory specimens during the acute phase of infection. The lowest concentration of SARS-CoV-2 viral copies this assay can detect is 138 copies/mL. A negative result does not preclude SARS-Cov-2 infection and should not be used as the sole basis for treatment or other patient management decisions. A  negative result may occur with  improper specimen collection/handling, submission of specimen other than nasopharyngeal swab, presence of viral mutation(s) within the areas targeted by this assay, and inadequate number of viral copies(<138 copies/mL). A negative result must be combined with clinical observations, patient history, and epidemiological information. The expected result is Negative.  Fact Sheet for Patients:  EntrepreneurPulse.com.au  Fact Sheet for Healthcare Providers:  IncredibleEmployment.be  This test is no t yet approved or cleared by the [place] FDA and  has been authorized for detection and/or diagnosis of SARS-CoV-2 by FDA under an Emergency Use Authorization (EUA). This EUA will remain  in effect (meaning this test can be used) for the duration of the COVID-19 declaration under Section 564(b)(1) of the Act, 21 U.S.C.section 360bbb-3(b)(1), unless the authorization is terminated  or revoked sooner.       Influenza A by PCR NEGATIVE NEGATIVE Final   Influenza B by PCR NEGATIVE NEGATIVE Final  Comment: (NOTE) The Xpert Xpress SARS-CoV-2/FLU/RSV plus assay is intended as an aid in the diagnosis of influenza from Nasopharyngeal swab specimens and should not be used as a sole basis for treatment. Nasal washings and aspirates are unacceptable for Xpert Xpress SARS-CoV-2/FLU/RSV testing.  Fact Sheet for Patients: EntrepreneurPulse.com.au  Fact Sheet for Healthcare Providers: IncredibleEmployment.be  This test is not yet approved or cleared by the [place] FDA and has been authorized for detection and/or diagnosis of SARS-CoV-2 by FDA under an Emergency Use Authorization (EUA). This EUA will remain in effect (meaning this test can be used) for the duration of the COVID-19 declaration under Section 564(b)(1) of the Act, 21 U.S.C. section 360bbb-3(b)(1), unless the authorization  is terminated or revoked.  Performed at [facility], [address], [place], [place] [address]          Radiology Studies: [facility] Chest [facility] 1 View  Result Date: 05/26/2021 CLINICAL DATA:  bilateral LE swelling x 2-3 days Denies SHOB. Reports a fall this morning around 2 am reports pain in left knee area. Pt able to get back up on his own and walk after fall EXAM: PORTABLE CHEST 1 VIEW COMPARISON:  09/06/2014. FINDINGS: Marked enlargement of the cardiopericardial silhouette, significantly increased when compared to the prior exam. No mediastinal or hilar masses. Lungs are hyperexpanded with interstitial thickening, interstitial thickening increased compared to prior exams. No lung consolidation. Possible small effusions. No pneumothorax. Skeletal structures grossly intact. IMPRESSION: 1. Marked enlargement of the cardiopericardial silhouette, significantly increased compared to the prior studies. Silhouette configuration raises suspicion for a pericardial effusion. 2. Bilateral interstitial thickening increased from the prior studies suggesting interstitial edema. No evidence of pneumonia. Electronically Signed   By: Lajean Manes M.D.   On: 05/26/2021 11:30        Scheduled Meds:  amLODipine  10 mg Oral Daily   aspirin  81 mg Oral Daily   atorvastatin  40 mg Oral Daily   [START ON 05/28/2021] calcitRIOL  0.25 mcg Oral Once per day on Mon Wed Fri   cholecalciferol  1,000 Units Oral Daily   cloNIDine  0.2 mg Oral BID   dorzolamide-timolol  1 drop Left Eye BID   heparin  5,000 Units Subcutaneous Q8H   labetalol  600 mg Oral BID   latanoprost  1 drop Both Eyes QHS   levothyroxine  125 mcg Oral Q0600   losartan  12.5 mg Oral Daily   minoxidil  2.5 mg Oral BID   olopatadine  1 drop Both Eyes Daily   omega-3 acid ethyl esters  2 g Oral Daily   pantoprazole  40 mg Oral Daily   phenytoin  300 mg Oral QHS   sodium bicarbonate  650 mg Oral BID   sodium chloride flush  3 mL Intravenous Q12H    Continuous Infusions:  sodium chloride       LOS: 1 day    Time spent: 35 minutes    Madeliene Tejera Darleen Crocker, DO [facility]  If 7PM-7AM, please contact night-coverage [email or web address] 05/27/2021, 9:42 AM

## 2021-05-28 ENCOUNTER — Encounter (HOSPITAL_COMMUNITY): Payer: Self-pay | Admitting: Internal Medicine

## 2021-05-28 DIAGNOSIS — I313 Pericardial effusion (noninflammatory): Secondary | ICD-10-CM | POA: Diagnosis not present

## 2021-05-28 DIAGNOSIS — N184 Chronic kidney disease, stage 4 (severe): Secondary | ICD-10-CM

## 2021-05-28 DIAGNOSIS — Z8673 Personal history of transient ischemic attack (TIA), and cerebral infarction without residual deficits: Secondary | ICD-10-CM | POA: Diagnosis not present

## 2021-05-28 DIAGNOSIS — I509 Heart failure, unspecified: Secondary | ICD-10-CM | POA: Diagnosis not present

## 2021-05-28 DIAGNOSIS — I10 Essential (primary) hypertension: Secondary | ICD-10-CM

## 2021-05-28 DIAGNOSIS — I714 Abdominal aortic aneurysm, without rupture: Secondary | ICD-10-CM

## 2021-05-28 LAB — CBC
HCT: 29.8 % — ABNORMAL LOW (ref 39.0–52.0)
Hemoglobin: 9.5 g/dL — ABNORMAL LOW (ref 13.0–17.0)
MCH: 28.9 pg (ref 26.0–34.0)
MCHC: 31.9 g/dL (ref 30.0–36.0)
MCV: 90.6 fL (ref 80.0–100.0)
Platelets: 207 10*3/uL (ref 150–400)
RBC: 3.29 MIL/uL — ABNORMAL LOW (ref 4.22–5.81)
RDW: 12.4 % (ref 11.5–15.5)
WBC: 4 10*3/uL (ref 4.0–10.5)
nRBC: 0 % (ref 0.0–0.2)

## 2021-05-28 LAB — BASIC METABOLIC PANEL
Anion gap: 8 (ref 5–15)
BUN: 34 mg/dL — ABNORMAL HIGH (ref 8–23)
CO2: 24 mmol/L (ref 22–32)
Calcium: 8.9 mg/dL (ref 8.9–10.3)
Chloride: 108 mmol/L (ref 98–111)
Creatinine, Ser: 2.58 mg/dL — ABNORMAL HIGH (ref 0.61–1.24)
GFR, Estimated: 27 mL/min — ABNORMAL LOW (ref >60)
Glucose, Bld: 89 mg/dL (ref 70–99)
Potassium: 4.4 mmol/L (ref 3.5–5.1)
Sodium: 140 mmol/L (ref 135–145)

## 2021-05-28 LAB — MAGNESIUM: Magnesium: 2 mg/dL (ref 1.7–2.4)

## 2021-05-28 MED ORDER — FUROSEMIDE 10 MG/ML IJ SOLN
20.0000 mg | Freq: Once | INTRAMUSCULAR | Status: AC
  Administered 2021-05-28: 20 mg via INTRAVENOUS
  Filled 2021-05-28: qty 2

## 2021-05-28 MED ORDER — CYANOCOBALAMIN 1000 MCG/ML IJ SOLN
1000.0000 ug | Freq: Once | INTRAMUSCULAR | Status: AC
  Administered 2021-05-28: 1000 ug via INTRAMUSCULAR
  Filled 2021-05-28: qty 1

## 2021-05-28 MED ORDER — FUROSEMIDE 10 MG/ML IJ SOLN: 40.0000 mg | Freq: Once | INTRAMUSCULAR | Status: DC

## 2021-05-28 MED ORDER — FUROSEMIDE 20 MG PO TABS
20.0000 mg | ORAL_TABLET | Freq: Every day | ORAL | Status: DC
  Administered 2021-05-28: 20 mg via ORAL
  Filled 2021-05-28: qty 1

## 2021-05-28 NOTE — Plan of Care (Signed)

## 2021-05-28 NOTE — Progress Notes (Signed)
PROGRESS NOTE    Kurt Nelson  [id number] DOB: 02-06-1956 DOA: 05/26/2021 PCP: [name], MD   Brief Narrative:   Kurt Nelson is a 65 y.o. male with medical history significant for AAA, hypertension, GERD, CKD stage IIIb, and dyslipidemia who was brought to the ED from his nursing facility due to worsening lower extremity edema with mild shortness of breath over the last 2-3 days.  He has been admitted with new onset acute CHF exacerbation.  2D echocardiogram with noted large pericardial effusion, but no tamponade physiology.  Plans are to transfer to [facility] for further cardiology evaluation and likely pericardiocentesis.  Assessment & Plan:   Active Problems:   Acute CHF ([place])   New onset acute diastolic CHF with large pericardial effusion -IV Lasix 40 mg twice daily now held as patient appears euvolemic -Plan to start p.o. Lasix today -Strict I's and O's and daily weights -Fluid restriction -2D echocardiogram with grade 2 diastolic dysfunction and LVEF 60 to 65%.  Large pericardial effusion noted for which patient will require pericardiocentesis and further evaluation.  Plan to transfer to [facility].  Discussed with cardiology Dr. Quentin Ore -Consult to Cardiology at [facility] due to delay in transfer   Hypertension-improved control -IV labetalol as needed -Continue usual home blood pressure medications    CKD stage IIIb -Baseline creatinine between 2-3 -Continue monitor in a.m.   Chronic anemia-multifactorial -B12 deficiency, administer B12 today -Continue to monitor -No overt bleeding noted   GERD -Continue PPI   History of AAA with prior CVA -Follows up with vascular surgery outpatient   Hypothyroidism -Continue levothyroxine   Tobacco abuse -Counseled on cessation     DVT prophylaxis:Heparin Code Status: Full Family Communication: Discussed with daughter on phone 9/25 Disposition Plan:  Status is: Inpatient   Remains inpatient appropriate because:IV  treatments appropriate due to intensity of illness or inability to take PO and Inpatient level of care appropriate due to severity of illness   Dispo: The patient is from: ALF              Anticipated d/c is to: ALF              Patient currently is not medically stable to d/c.              Difficult to place patient No     Consultants:  Cardiology-Dr. Quentin Ore   Procedures:  See below   Antimicrobials:  None  Subjective: Patient seen and evaluated today with no new acute complaints or concerns. No acute concerns or events noted overnight.  Objective: Vitals:   05/27/21 1430 05/27/21 2023 05/28/21 0500 05/28/21 0500  BP: (!) 115/93 140/89  (!) 145/84  Pulse: 76 82  73  Resp: '18 18  18  '$ Temp: 98.4 F (36.9 C) 98.8 F (37.1 C)  98.8 F (37.1 C)  TempSrc: Oral     SpO2: 96% 95%  97%  Weight:   58.4 kg   Height:        Intake/Output Summary (Last 24 hours) at 05/28/2021 0907 Last data filed at 05/28/2021 0512 Gross per 24 hour  Intake 1360 ml  Output 2200 ml  Net -840 ml   Filed Weights   05/26/21 1847 05/27/21 0530 05/28/21 0500  Weight: 60.5 kg 58.3 kg 58.4 kg    Examination:  General exam: Appears calm and comfortable  Respiratory system: Clear to auscultation. Respiratory effort normal. Cardiovascular system: S1 & S2 heard, RRR.  Gastrointestinal system: Abdomen is  soft Central nervous system: Alert and awake Extremities: No edema Skin: No significant lesions noted Psychiatry: Flat affect.    Data Reviewed: I have personally reviewed following labs and imaging studies  CBC: Recent Labs  Lab 05/26/21 1057 05/27/21 0505 05/28/21 0505  WBC 5.0 4.3 4.0  NEUTROABS 3.3  --   --   HGB 10.9* 9.8* 9.5*  HCT 34.6* 31.1* 29.8*  MCV 92.5 90.7 90.6  PLT 216 208 [id number]   Basic Metabolic Panel: Recent Labs  Lab 05/26/21 1057 05/27/21 0505 05/28/21 0505  NA 139 141 140  K 4.8 4.4 4.4  CL 108 108 108  CO2 '24 26 24  '$ GLUCOSE 103* 84 89  BUN 27* 34* 34*   CREATININE 2.47* 2.59* 2.58*  CALCIUM 9.1 8.8* 8.9  MG  --  1.9 2.0   GFR: Estimated Creatinine Clearance: 23.6 mL/min (A) (by C-G formula based on SCr of 2.58 mg/dL (H)). Liver Function Tests: Recent Labs  Lab 05/26/21 1057  AST 22  ALT 23  ALKPHOS 220*  BILITOT 0.3  PROT 7.7  ALBUMIN 3.8   No results for input(s): LIPASE, AMYLASE in the last 168 hours. No results for input(s): AMMONIA in the last 168 hours. Coagulation Profile: No results for input(s): INR, PROTIME in the last 168 hours. Cardiac Enzymes: No results for input(s): CKTOTAL, CKMB, CKMBINDEX, TROPONINI in the last 168 hours. BNP (last 3 results) No results for input(s): PROBNP in the last 8760 hours. HbA1C: No results for input(s): HGBA1C in the last 72 hours. CBG: No results for input(s): GLUCAP in the last 168 hours. Lipid Profile: No results for input(s): CHOL, HDL, LDLCALC, TRIG, CHOLHDL, LDLDIRECT in the last 72 hours. Thyroid Function Tests: No results for input(s): TSH, T4TOTAL, FREET4, T3FREE, THYROIDAB in the last 72 hours. Anemia Panel: Recent Labs    05/27/21 1222  VITAMINB12 165*  FOLATE 6.1  FERRITIN 24  TIBC 283  IRON 50  RETICCTPCT 1.0   Sepsis Labs: No results for input(s): PROCALCITON, LATICACIDVEN in the last 168 hours.  Recent Results (from the past 240 hour(s))  Resp Panel by RT-PCR (Flu A&B, Covid) Nasopharyngeal Swab     Status: None   Collection Time: 05/26/21 11:16 AM   Specimen: Nasopharyngeal Swab; Nasopharyngeal(NP) swabs in vial transport medium  Result Value Ref Range Status   SARS Coronavirus 2 by RT PCR NEGATIVE NEGATIVE Final    Comment: (NOTE) SARS-CoV-2 target nucleic acids are NOT DETECTED.  The SARS-CoV-2 RNA is generally detectable in upper respiratory specimens during the acute phase of infection. The lowest concentration of SARS-CoV-2 viral copies this assay can detect is 138 copies/mL. A negative result does not preclude SARS-Cov-2 infection and should  not be used as the sole basis for treatment or other patient management decisions. A negative result may occur with  improper specimen collection/handling, submission of specimen other than nasopharyngeal swab, presence of viral mutation(s) within the areas targeted by this assay, and inadequate number of viral copies(<138 copies/mL). A negative result must be combined with clinical observations, patient history, and epidemiological information. The expected result is Negative.  Fact Sheet for Patients:  EntrepreneurPulse.com.au  Fact Sheet for Healthcare Providers:  IncredibleEmployment.be  This test is no t yet approved or cleared by the [place] FDA and  has been authorized for detection and/or diagnosis of SARS-CoV-2 by FDA under an Emergency Use Authorization (EUA). This EUA will remain  in effect (meaning this test can be used) for the duration of the COVID-19 declaration  under Section 564(b)(1) of the Act, 21 U.S.C.section 360bbb-3(b)(1), unless the authorization is terminated  or revoked sooner.       Influenza A by PCR NEGATIVE NEGATIVE Final   Influenza B by PCR NEGATIVE NEGATIVE Final    Comment: (NOTE) The Xpert Xpress SARS-CoV-2/FLU/RSV plus assay is intended as an aid in the diagnosis of influenza from Nasopharyngeal swab specimens and should not be used as a sole basis for treatment. Nasal washings and aspirates are unacceptable for Xpert Xpress SARS-CoV-2/FLU/RSV testing.  Fact Sheet for Patients: EntrepreneurPulse.com.au  Fact Sheet for Healthcare Providers: IncredibleEmployment.be  This test is not yet approved or cleared by the [place] FDA and has been authorized for detection and/or diagnosis of SARS-CoV-2 by FDA under an Emergency Use Authorization (EUA). This EUA will remain in effect (meaning this test can be used) for the duration of the COVID-19 declaration under  Section 564(b)(1) of the Act, 21 U.S.C. section 360bbb-3(b)(1), unless the authorization is terminated or revoked.  Performed at [facility], [address] [place],  [address]          Radiology Studies: [facility] 1 View  Result Date: 05/26/2021 CLINICAL DATA:  bilateral LE swelling x 2-3 days Denies SHOB. Reports a fall this morning around 2 am reports pain in left knee area. Pt able to get back up on his own and walk after fall EXAM: PORTABLE CHEST 1 VIEW COMPARISON:  09/06/2014. FINDINGS: Marked enlargement of the cardiopericardial silhouette, significantly increased when compared to the prior exam. No mediastinal or hilar masses. Lungs are hyperexpanded with interstitial thickening, interstitial thickening increased compared to prior exams. No lung consolidation. Possible small effusions. No pneumothorax. Skeletal structures grossly intact. IMPRESSION: 1. Marked enlargement of the cardiopericardial silhouette, significantly increased compared to the prior studies. Silhouette configuration raises suspicion for a pericardial effusion. 2. Bilateral interstitial thickening increased from the prior studies suggesting interstitial edema. No evidence of pneumonia. Electronically Signed   By: Lajean Manes M.D.   On: 05/26/2021 11:30   ECHOCARDIOGRAM COMPLETE  Result Date: 05/27/2021    ECHOCARDIOGRAM REPORT   Patient Name:   Kurt Nelson Date of Exam: 05/27/2021 Medical Rec #:  [id number]    Height:       67.0 in Accession #:    [id number]   Weight:       128.5 lb Date of Birth:  [birth year]   BSA:          1.676 m Patient Age:    89 years     BP:           145/83 mmHg Patient Gender: M            HR:           87 bpm. Exam Location:  [facility] Procedure: 2D Echo, Cardiac Doppler and Color Doppler STAT ECHO Indications:    CHF  History:        Patient has no prior history of Echocardiogram examinations.                 CHF, Signs/Symptoms:AAA and Shortness of Breath; Risk                  Factors:Hypertension and Dyslipidemia. Cocaine abuse, ETOH                 abuse, LE edema, CKD.  Sonographer:    Dustin Flock RDCS Referring Phys: [id number] Royanne Foots [place]  1. Consider amyloidosis.  2. Left  ventricular ejection fraction, by estimation, is 60 to 65%. The left ventricle has normal function. The left ventricle has no regional wall motion abnormalities. There is severe concentric left ventricular hypertrophy. Left ventricular diastolic  parameters are consistent with Grade II diastolic dysfunction (pseudonormalization).  3. Right ventricular systolic function is normal. The right ventricular size is normal. There is normal pulmonary artery systolic pressure. The estimated right ventricular systolic pressure is Q000111Q mmHg.  4. 5 cm pericardial effusion. Very large. Although there is no RV/ RA collpase, pericardial tamponade remains a clinical bedside diagnosis. Large pericardial effusion. The pericardial effusion is circumferential.  5. The mitral valve is normal in structure. No evidence of mitral valve regurgitation. No evidence of mitral stenosis.  6. The aortic valve is normal in structure. Aortic valve regurgitation is not visualized. No aortic stenosis is present.  7. The inferior vena cava is normal in size with greater than 50% respiratory variability, suggesting right atrial pressure of 3 mmHg. Conclusion(s)/Recommendation(s): Findings consistent with hypertrophic cardiomyopathy. FINDINGS  Left Ventricle: Left ventricular ejection fraction, by estimation, is 60 to 65%. The left ventricle has normal function. The left ventricle has no regional wall motion abnormalities. The left ventricular internal cavity size was normal in size. There is  severe concentric left ventricular hypertrophy. Left ventricular diastolic parameters are consistent with Grade II diastolic dysfunction (pseudonormalization). Right Ventricle: The right ventricular size is normal. No increase in right  ventricular wall thickness. Right ventricular systolic function is normal. There is normal pulmonary artery systolic pressure. The tricuspid regurgitant velocity is 2.76 m/s, and  with an assumed right atrial pressure of 3 mmHg, the estimated right ventricular systolic pressure is Q000111Q mmHg. Left Atrium: Left atrial size was normal in size. Right Atrium: Right atrial size was normal in size. Pericardium: 5 cm pericardial effusion. Very large. Although there is no RV/ RA collpase, pericardial tamponade remains a clinical bedside diagnosis. A large pericardial effusion is present. The pericardial effusion is circumferential. Mitral Valve: The mitral valve is normal in structure. No evidence of mitral valve regurgitation. No evidence of mitral valve stenosis. Tricuspid Valve: The tricuspid valve is normal in structure. Tricuspid valve regurgitation is trivial. No evidence of tricuspid stenosis. Aortic Valve: The aortic valve is normal in structure. Aortic valve regurgitation is not visualized. No aortic stenosis is present. Pulmonic Valve: The pulmonic valve was normal in structure. Pulmonic valve regurgitation is not visualized. No evidence of pulmonic stenosis. Aorta: The aortic root is normal in size and structure. Venous: The inferior vena cava is normal in size with greater than 50% respiratory variability, suggesting right atrial pressure of 3 mmHg. IAS/Shunts: No atrial level shunt detected by color flow Doppler. Additional Comments: Consider amyloidosis.  LEFT VENTRICLE PLAX 2D LVIDd:         4.20 cm     Diastology LVIDs:         2.70 cm     LV e' medial:    4.20 cm/s LV PW:         1.50 cm     LV E/e' medial:  13.8 LV IVS:        1.40 cm     LV e' lateral:   4.51 cm/s LVOT diam:     2.10 cm     LV E/e' lateral: 12.9 LV SV:         69 LV SV Index:   41 LVOT Area:     3.46 cm  LV Volumes (MOD) LV vol d, MOD  A4C: 70.8 ml LV vol s, MOD A4C: 20.0 ml LV SV MOD A4C:     70.8 ml RIGHT VENTRICLE RV Basal diam:  2.70  cm RV S prime:     11.50 cm/s TAPSE (M-mode): 2.2 cm LEFT ATRIUM             Index       RIGHT ATRIUM           Index LA diam:        4.10 cm 2.45 cm/m  RA Area:     11.30 cm LA Vol (A2C):   96.8 ml 57.76 ml/m RA Volume:   27.10 ml  16.17 ml/m LA Vol (A4C):   44.7 ml 26.67 ml/m LA Biplane Vol: 69.8 ml 41.65 ml/m  AORTIC VALVE LVOT Vmax:   127.00 cm/s LVOT Vmean:  76.900 cm/s LVOT VTI:    0.199 m  AORTA Ao Root diam: 2.80 cm MITRAL VALVE               TRICUSPID VALVE MV Area (PHT): 3.65 cm    TR Peak grad:   30.5 mmHg MV Decel Time: 208 msec    TR Vmax:        276.00 cm/s MV E velocity: 58.00 cm/s MV A velocity: 45.40 cm/s  SHUNTS MV E/A ratio:  1.28        Systemic VTI:  0.20 m                            Systemic Diam: 2.10 cm Candee Furbish MD Electronically signed by Candee Furbish MD Signature Date/Time: 05/27/2021/11:35:46 AM    Final         Scheduled Meds:  amLODipine  10 mg Oral Daily   aspirin  81 mg Oral Daily   atorvastatin  40 mg Oral Daily   calcitRIOL  0.25 mcg Oral Once per day on Mon Wed Fri   cholecalciferol  1,000 Units Oral Daily   cloNIDine  0.2 mg Oral BID   cyanocobalamin  1,000 mcg Intramuscular Once   dorzolamide-timolol  1 drop Left Eye BID   heparin  5,000 Units Subcutaneous Q8H   labetalol  600 mg Oral BID   latanoprost  1 drop Both Eyes QHS   levothyroxine  125 mcg Oral Q0600   losartan  12.5 mg Oral Daily   minoxidil  2.5 mg Oral BID   olopatadine  1 drop Both Eyes Daily   omega-3 acid ethyl esters  2 g Oral Daily   pantoprazole  40 mg Oral Daily   phenytoin  300 mg Oral QHS   sodium bicarbonate  650 mg Oral BID   sodium chloride flush  3 mL Intravenous Q12H   Continuous Infusions:  sodium chloride       LOS: 2 days    Time spent: 35 minutes    Tavonna Worthington Darleen Crocker, DO [facility]  If 7PM-7AM, please contact night-coverage [email or web address] 05/28/2021, 9:07 AM

## 2021-05-28 NOTE — Plan of Care (Signed)
  Problem: Education: Goal: Knowledge of General Education information will improve Description Including pain rating scale, medication(s)/side effects and non-pharmacologic comfort measures Outcome: Progressing   

## 2021-05-28 NOTE — Consult Note (Addendum)
Cardiology Consultation:   Patient ID: Kurt Nelson MRN: [id number]; DOB: 1956-01-15  Admit date: 05/26/2021 Date of Consult: 05/28/2021  PCP:  [name], MD   [facility] Providers Cardiologist:  Rozann Lesches, MD        Patient Profile:   Kurt Nelson is a 65 y.o. male with a hx of AAA 3.1 cm, history of CVA, HTN, CKD stage 3b, HLD , tobacco abuse, prior cocaine use, who is being seen 05/28/2021 for the evaluation of pericardial effusion at the request of Dr. Manuella Ghazi.  History of Present Illness:   Kurt Nelson is a 65 yo male with above history was transferred from nursing home for worsening shortness of breath and edema. Echo shows large circumferential pericardial effusion without definitive evidence of tamponade, consider amyloidosis, LVEF 60-65% severe LVH grade 2 DD, Hypertrophic cardiomyopathy. No prior echo to compare. Patient says he had edema for the last 3 days, better since receiving Lasix in the hospital.  States that he intermittently feels short of breath, cannot actually define a time course.  He does not report any chest pain.  He is a resident of [facility] nursing facility.  He has a daughter that is involved in his care and helps with healthcare decisions.  Echocardiogram read over the weekend and case discussed amongst Dr. Manuella Ghazi, Dr. Marlou Porch, and Dr. Quentin Ore with recommendation to transfer Kurt Nelson to [facility] for further cardiology evaluation, potentially a diagnostic pericardiocentesis versus pericardial window.   Past Medical History:  Diagnosis Date   AAA (abdominal aortic aneurysm) (HCC)    Anemia, normocytic normochromic    CKD (chronic kidney disease) stage 3, GFR 30-59 ml/min (HCC)    Cocaine abuse (HCC)    ETOH abuse    GERD (gastroesophageal reflux disease)    Hyperglycemia    Hypertension    ICH (intracerebral hemorrhage) (HCC)    Seizures ([place])    Stroke ([place])     No past surgical history on file.   Home Medications:  Prior to  Admission medications   Medication Sig Start Date End Date Taking? Authorizing Provider  amLODipine (NORVASC) 10 MG tablet Take 10 mg by mouth daily.   Yes [provider]  aspirin 81 MG tablet Take 81 mg by mouth daily.   Yes [provider]  atorvastatin (LIPITOR) 40 MG tablet Take 40 mg by mouth daily.   Yes [provider]  calcitRIOL (ROCALTROL) 0.25 MCG capsule Take 0.25 mcg by mouth See admin instructions. Take 1 capsule by mouth 3 times a weekly on Monday,Wednesday and Friday 04/30/21  Yes [provider]  Cholecalciferol 25 MCG (1000 UT) capsule Take 1,000 Units by mouth daily.   Yes [provider]  cloNIDine (CATAPRES) 0.2 MG tablet Take 0.2 mg by mouth 2 (two) times daily.   Yes [provider]  dorzolamide-timolol (COSOPT) 22.3-6.8 MG/ML ophthalmic solution Place 1 drop into the left eye 2 (two) times daily.   Yes [provider]  fish oil-omega-3 fatty acids 1000 MG capsule Take 2 g by mouth daily.   Yes [provider]  furosemide (LASIX) 40 MG tablet Take 40 mg by mouth See admin instructions. Take 1 tablet on Wednesday and Friday.   Yes [provider]  labetalol (NORMODYNE) 300 MG tablet Take 600 mg by mouth 2 (two) times daily.    Yes [provider]  levothyroxine (SYNTHROID) 125 MCG tablet Take by mouth.   Yes [provider]  losartan (COZAAR) 25  MG tablet Take 12.5 mg by mouth daily. 04/30/21  Yes [provider]  LUMIGAN 0.01 % SOLN Place 1 drop into both eyes at bedtime. 04/30/21  Yes [provider]  minoxidil (LONITEN) 2.5 MG tablet Take 2.5 mg by mouth 2 (two) times daily.    Yes [provider]  olopatadine (PATANOL) 0.1 % ophthalmic solution Place 1 drop into both eyes daily. 04/30/21  Yes [provider]  omeprazole (PRILOSEC) 20 MG capsule Take 20 mg by mouth daily.   Yes [provider]  phenytoin (DILANTIN) 100 MG ER capsule  Take 300 mg by mouth at bedtime.    Yes [provider]  sodium bicarbonate 650 MG tablet Take 650 mg by mouth 2 (two) times daily.   Yes [provider]  calcitRIOL (ROCALTROL) 0.5 MCG capsule Take 0.5 mcg by mouth daily. Patient not taking: No sig reported    [provider]  Difluprednate 0.05 % EMUL Apply 1 drop to eye 2 (two) times daily. Patient not taking: Reported on 05/26/2021    [provider]  ketoconazole (NIZORAL) 2 % shampoo Apply 1 application topically 2 (two) times a week. No specific days Patient not taking: Reported on 05/26/2021    [provider]  levothyroxine (SYNTHROID, LEVOTHROID) 75 MCG tablet Take 125 mcg by mouth daily before breakfast. Patient not taking: Reported on 05/26/2021    [provider]  lisinopril (PRINIVIL,ZESTRIL) 5 MG tablet Take 5 mg by mouth daily. Patient not taking: Reported on 05/26/2021    [provider]  prednisoLONE acetate (PRED FORTE) 1 % ophthalmic suspension Place 1 drop into the left eye 2 (two) times daily. Patient not taking: Reported on 05/26/2021    [provider]  PRESCRIPTION MEDICATION Take 1 capsule by mouth daily. Vitamin D3 2000 i.u. Capsule    [provider]  sodium polystyrene (KAYEXALATE) 15 GM/60ML suspension Take 60 g by mouth See admin instructions. Drink 60gm (269m) by mouth once a week. No specific day.    [provider]    Inpatient Medications: Scheduled Meds:  amLODipine  10 mg Oral Daily   aspirin  81 mg Oral Daily   atorvastatin  40 mg Oral Daily   calcitRIOL  0.25 mcg Oral Once per day on Mon Wed Fri   cholecalciferol  1,000 Units Oral Daily   cloNIDine  0.2 mg Oral BID   dorzolamide-timolol  1 drop Left Eye BID   furosemide  20 mg Intravenous Once   heparin  5,000 Units Subcutaneous Q8H   labetalol  600 mg Oral BID   latanoprost  1 drop Both Eyes QHS   levothyroxine  125 mcg Oral Q0600   losartan  12.5 mg Oral Daily    minoxidil  2.5 mg Oral BID   olopatadine  1 drop Both Eyes Daily   omega-3 acid ethyl esters  2 g Oral Daily   pantoprazole  40 mg Oral Daily   phenytoin  300 mg Oral QHS   sodium bicarbonate  650 mg Oral BID   sodium chloride flush  3 mL Intravenous Q12H   Continuous Infusions:  sodium chloride     PRN Meds: sodium chloride, acetaminophen **OR** acetaminophen, labetalol, ondansetron **OR** ondansetron (ZOFRAN) IV, sodium chloride flush  Allergies:    Allergies  Allergen Reactions   Penicillins Itching and Rash    Social History:   Social History   Tobacco Use   Smoking status: Light Smoker    Packs/day: 0.25  Types: Cigarettes   Smokeless tobacco: Never  Substance Use Topics   Alcohol use: No    Alcohol/week: 0.0 standard drinks     Family History:     Family History  Problem Relation Age of Onset   Hypertension Mother    Hypertension Sister    Hypertension Sister      ROS:  Please see the history of present illness.  Review of Systems  Constitutional: Negative.  HENT: Negative.    Cardiovascular:  Positive for leg swelling.  Respiratory:  Positive for shortness of breath.   Endocrine: Negative.   Hematologic/Lymphatic: Negative.   Musculoskeletal: Negative.   Gastrointestinal: Negative.   Genitourinary: Negative.   Neurological: Negative.    All other ROS reviewed and negative.     Physical Exam/Data:   Vitals:   05/27/21 1430 05/27/21 2023 05/28/21 0500 05/28/21 0500  BP: (!) 115/93 140/89  (!) 145/84  Pulse: 76 82  73  Resp: '18 18  18  '$ Temp: 98.4 F (36.9 C) 98.8 F (37.1 C)  98.8 F (37.1 C)  TempSrc: Oral     SpO2: 96% 95%  97%  Weight:   58.4 kg   Height:        Intake/Output Summary (Last 24 hours) at 05/28/2021 1017 Last data filed at 05/28/2021 0512 Gross per 24 hour  Intake 1360 ml  Output 2200 ml  Net -840 ml   Last 3 Weights 05/28/2021 05/27/2021 05/26/2021  Weight (lbs) 128 lb 12 oz 128 lb 8.5 oz 133 lb 6.1 oz  Weight  (kg) 58.4 kg 58.3 kg 60.5 kg     Body mass index is 20.16 kg/m.  General:  Thin, in no acute distress  HEENT: normal Neck: no JVD Vascular: No carotid bruits; Distal pulses 2+ bilaterally Cardiac: Indistinct PMI, normal S1, S2; RRR; no murmur   Lungs:  Decreased breath sounds but clear to auscultation bilaterally, no wheezing, rhonchi or rales  Abd: soft, nontender, no hepatomegaly  Ext: no pitting edema Musculoskeletal:  No deformities, BUE and BLE strength normal and equal Skin: warm and dry  Neuro:  CNs 2-12 intact, no focal abnormalities noted Psych:  Normal affect   EKG:  The EKG was personally reviewed and demonstrates: Sinus rhythm with lead artifact, nonspecific ST-T changes. Telemetry:  Telemetry was personally reviewed and demonstrates:  NSR with PVC.  Relevant CV Studies:  Echo 05/27/21 IMPRESSIONS    1. Consider amyloidosis.   2. Left ventricular ejection fraction, by estimation, is 60 to 65%. The  left ventricle has normal function. The left ventricle has no regional  wall motion abnormalities. There is severe concentric left ventricular  hypertrophy. Left ventricular diastolic   parameters are consistent with Grade II diastolic dysfunction  (pseudonormalization).   3. Right ventricular systolic function is normal. The right ventricular  size is normal. There is normal pulmonary artery systolic pressure. The  estimated right ventricular systolic pressure is Q000111Q mmHg.   4. 5 cm pericardial effusion. Very large. Although there is no RV/ RA  collpase, pericardial tamponade remains a clinical bedside diagnosis.  Large pericardial effusion. The pericardial effusion is circumferential.   5. The mitral valve is normal in structure. No evidence of mitral valve  regurgitation. No evidence of mitral stenosis.   6. The aortic valve is normal in structure. Aortic valve regurgitation is  not visualized. No aortic stenosis is present.   7. The inferior vena cava is normal in  size with greater than 50%  respiratory variability, suggesting  right atrial pressure of 3 mmHg.   Conclusion(s)/Recommendation(s): Findings consistent with hypertrophic  cardiomyopathy.   01/17/2021 Abdominal Aorta: There is evidence of abnormal dilatation of the distal  Abdominal aorta. There is evidence of abnormal dilation of the Right  Common Iliac artery. Prior right common iliac diameters on 01/18/2020 was  2.1 cm . Incidental finding of  significant fluid around the heart. The largest aortic diameter remains  essentially unchanged compared to prior exam. Previous diameter  measurement was 3.1 cm obtained on 01/18/2020.      AAA diameter measurement was 3.05 cm obtained on 01/18/2020. The previous  right common iliac artery measurement was 2.1 cm.   Laboratory Data:  High Sensitivity Troponin:   Recent Labs  Lab 05/26/21 1100 05/26/21 1510  TROPONINIHS 25* 27*     Chemistry Recent Labs  Lab 05/26/21 1057 05/27/21 0505 05/28/21 0505  NA 139 141 140  K 4.8 4.4 4.4  CL 108 108 108  CO2 '24 26 24  '$ GLUCOSE 103* 84 89  BUN 27* 34* 34*  CREATININE 2.47* 2.59* 2.58*  CALCIUM 9.1 8.8* 8.9  MG  --  1.9 2.0  GFRNONAA 28* 27* 27*  ANIONGAP '7 7 8    '$ Recent Labs  Lab 05/26/21 1057  PROT 7.7  ALBUMIN 3.8  AST 22  ALT 23  ALKPHOS 220*  BILITOT 0.3   Hematology Recent Labs  Lab 05/26/21 1057 05/27/21 0505 05/27/21 1222 05/28/21 0505  WBC 5.0 4.3  --  4.0  RBC 3.74* 3.43* 3.43* 3.29*  HGB 10.9* 9.8*  --  9.5*  HCT 34.6* 31.1*  --  29.8*  MCV 92.5 90.7  --  90.6  MCH 29.1 28.6  --  28.9  MCHC 31.5 31.5  --  31.9  RDW 12.7 12.6  --  12.4  PLT 216 208  --  207    BNP Recent Labs  Lab 05/26/21 1057  BNP 954.0*    Radiology/Studies:  DG Chest Port 1 View  Result Date: 05/26/2021 CLINICAL DATA:  bilateral LE swelling x 2-3 days Denies SHOB. Reports a fall this morning around 2 am reports pain in left knee area. Pt able to get back up on his own and walk  after fall EXAM: PORTABLE CHEST 1 VIEW COMPARISON:  09/06/2014. FINDINGS: Marked enlargement of the cardiopericardial silhouette, significantly increased when compared to the prior exam. No mediastinal or hilar masses. Lungs are hyperexpanded with interstitial thickening, interstitial thickening increased compared to prior exams. No lung consolidation. Possible small effusions. No pneumothorax. Skeletal structures grossly intact. IMPRESSION: 1. Marked enlargement of the cardiopericardial silhouette, significantly increased compared to the prior studies. Silhouette configuration raises suspicion for a pericardial effusion. 2. Bilateral interstitial thickening increased from the prior studies suggesting interstitial edema. No evidence of pneumonia. Electronically Signed   By: Lajean Manes M.D.   On: 05/26/2021 11:30   ECHOCARDIOGRAM COMPLETE  Result Date: 05/27/2021    ECHOCARDIOGRAM REPORT   Patient Name:   CANDELARIO BELONE Date of Exam: 05/27/2021 Medical Rec #:  [id number]    Height:       67.0 in Accession #:    [id number]   Weight:       128.5 lb Date of Birth:  Sep 16, 1955   BSA:          1.676 m Patient Age:    86 years     BP:           145/83 mmHg Patient Gender: M  HR:           87 bpm. Exam Location:  [facility] Procedure: 2D Echo, Cardiac Doppler and Color Doppler STAT ECHO Indications:    CHF  History:        Patient has no prior history of Echocardiogram examinations.                 CHF, Signs/Symptoms:AAA and Shortness of Breath; Risk                 Factors:Hypertension and Dyslipidemia. Cocaine abuse, ETOH                 abuse, LE edema, CKD.  Sonographer:    Dustin Flock RDCS Referring Phys: [id number] Royanne Foots [place]  1. Consider amyloidosis.  2. Left ventricular ejection fraction, by estimation, is 60 to 65%. The left ventricle has normal function. The left ventricle has no regional wall motion abnormalities. There is severe concentric left ventricular hypertrophy. Left  ventricular diastolic  parameters are consistent with Grade II diastolic dysfunction (pseudonormalization).  3. Right ventricular systolic function is normal. The right ventricular size is normal. There is normal pulmonary artery systolic pressure. The estimated right ventricular systolic pressure is Q000111Q mmHg.  4. 5 cm pericardial effusion. Very large. Although there is no RV/ RA collpase, pericardial tamponade remains a clinical bedside diagnosis. Large pericardial effusion. The pericardial effusion is circumferential.  5. The mitral valve is normal in structure. No evidence of mitral valve regurgitation. No evidence of mitral stenosis.  6. The aortic valve is normal in structure. Aortic valve regurgitation is not visualized. No aortic stenosis is present.  7. The inferior vena cava is normal in size with greater than 50% respiratory variability, suggesting right atrial pressure of 3 mmHg. Conclusion(s)/Recommendation(s): Findings consistent with hypertrophic cardiomyopathy. FINDINGS  Left Ventricle: Left ventricular ejection fraction, by estimation, is 60 to 65%. The left ventricle has normal function. The left ventricle has no regional wall motion abnormalities. The left ventricular internal cavity size was normal in size. There is  severe concentric left ventricular hypertrophy. Left ventricular diastolic parameters are consistent with Grade II diastolic dysfunction (pseudonormalization). Right Ventricle: The right ventricular size is normal. No increase in right ventricular wall thickness. Right ventricular systolic function is normal. There is normal pulmonary artery systolic pressure. The tricuspid regurgitant velocity is 2.76 m/s, and  with an assumed right atrial pressure of 3 mmHg, the estimated right ventricular systolic pressure is Q000111Q mmHg. Left Atrium: Left atrial size was normal in size. Right Atrium: Right atrial size was normal in size. Pericardium: 5 cm pericardial effusion. Very large. Although  there is no RV/ RA collpase, pericardial tamponade remains a clinical bedside diagnosis. A large pericardial effusion is present. The pericardial effusion is circumferential. Mitral Valve: The mitral valve is normal in structure. No evidence of mitral valve regurgitation. No evidence of mitral valve stenosis. Tricuspid Valve: The tricuspid valve is normal in structure. Tricuspid valve regurgitation is trivial. No evidence of tricuspid stenosis. Aortic Valve: The aortic valve is normal in structure. Aortic valve regurgitation is not visualized. No aortic stenosis is present. Pulmonic Valve: The pulmonic valve was normal in structure. Pulmonic valve regurgitation is not visualized. No evidence of pulmonic stenosis. Aorta: The aortic root is normal in size and structure. Venous: The inferior vena cava is normal in size with greater than 50% respiratory variability, suggesting right atrial pressure of 3 mmHg. IAS/Shunts: No atrial level shunt detected by color flow Doppler. Additional Comments:  Consider amyloidosis.  LEFT VENTRICLE PLAX 2D LVIDd:         4.20 cm     Diastology LVIDs:         2.70 cm     LV e' medial:    4.20 cm/s LV PW:         1.50 cm     LV E/e' medial:  13.8 LV IVS:        1.40 cm     LV e' lateral:   4.51 cm/s LVOT diam:     2.10 cm     LV E/e' lateral: 12.9 LV SV:         69 LV SV Index:   41 LVOT Area:     3.46 cm  LV Volumes (MOD) LV vol d, MOD A4C: 70.8 ml LV vol s, MOD A4C: 20.0 ml LV SV MOD A4C:     70.8 ml RIGHT VENTRICLE RV Basal diam:  2.70 cm RV S prime:     11.50 cm/s TAPSE (M-mode): 2.2 cm LEFT ATRIUM             Index       RIGHT ATRIUM           Index LA diam:        4.10 cm 2.45 cm/m  RA Area:     11.30 cm LA Vol (A2C):   96.8 ml 57.76 ml/m RA Volume:   27.10 ml  16.17 ml/m LA Vol (A4C):   44.7 ml 26.67 ml/m LA Biplane Vol: 69.8 ml 41.65 ml/m  AORTIC VALVE LVOT Vmax:   127.00 cm/s LVOT Vmean:  76.900 cm/s LVOT VTI:    0.199 m  AORTA Ao Root diam: 2.80 cm MITRAL VALVE                TRICUSPID VALVE MV Area (PHT): 3.65 cm    TR Peak grad:   30.5 mmHg MV Decel Time: 208 msec    TR Vmax:        276.00 cm/s MV E velocity: 58.00 cm/s MV A velocity: 45.40 cm/s  SHUNTS MV E/A ratio:  1.28        Systemic VTI:  0.20 m                            Systemic Diam: 2.10 cm Candee Furbish MD Electronically signed by Candee Furbish MD Signature Date/Time: 05/27/2021/11:35:46 AM    Final      Assessment and Plan:   Large pericardial effusion with echo shows probable amyloid with severe LVH-now symptomatically improved, plan to transfer to [facility](on medicine service but discussed with Dr. Quentin Ore and Dr. Marlou Porch) for possible pericardial window. I/O's negative 2.4 L after IV lasix 40 mg. Will give another 20 mg IV today since he received 20 mg po already.  History of CVA-daughter involved in his care.  HTN- BP stable  CKD stage 4 Crt 2.58  AAA 3.05 cm followed by vascular   Risk Assessment/Risk Scores:        New York Heart Association (NYHA) Functional Class NYHA Class II        For questions or updates, please contact [facility] Please consult [email or web address] for contact info under    Signed, Ermalinda Barrios PA-C 05/28/2021 10:17 AM    Attending note:  Patient seen and examined, I reviewed his records and discussed the case with Ms. Bonnell Public PA-C as well as with Dr. Manuella Ghazi.  Kurt Nelson is a resident of [facility], currently admitted with recent lower leg swelling and baseline intermittent shortness of breath.  Echocardiogram performed yesterday reported LVEF 60 to 65% with severe LVH and moderate diastolic dysfunction, normal RV contraction and estimated RVSP, and a very large circumferential pericardial effusion with no clear echocardiographic evidence of tamponade.  Case discussed amongst Dr. Manuella Ghazi, Dr. Marlou Porch, and Dr. Quentin Ore with plan to have patient transferred to the hospitalist team at [facility] for cardiology evaluation regarding potential for a diagnostic  pericardiocentesis versus pericardial window and possible work-up of amyloidosis.  On examination patient is afebrile, systolic blood pressure 0000000, heart rate in the 70s to 80s in sinus rhythm by telemetry which I personally reviewed.  Lungs exhibit decreased breath sounds, some central egophony.  Cardiac exam with indistinct PMI, RRR no gallop.  Trace ankle edema noted.  Pertinent lab work includes potassium 4.4, BUN 34, creatinine 2.58, high-sensitivity troponin I 27, hemoglobin 9.5, platelets 207.  Chest x-ray reports enlarged cardiac silhouette consistent with pericardial effusion, no infiltrates, possible mild interstitial edema.  I personally reviewed his ECG which shows sinus rhythm with lead artifact, nonspecific ST-T changes.  Upper normal voltage.  Patient presents with very large circumferential pericardial effusion, no definite echocardiographic findings to suggest tamponade, but potentially with some symptoms including recent peripheral edema and intermittent shortness of breath.  Duration is uncertain, his last chest x-ray was in 2016, pericardial silhouette is markedly enlarged in comparison.  No previous echocardiograms.  Question raised as to diagnosis of amyloidosis which is certainly possible.  Patient is currently a nursing home resident, prior history of stroke and previous ICH, previous substance abuse, CKD stage IIIb, and asymptomatic AAA.  Not entirely clear how aggressive a work-up would be pursued at this point, but reasonable to proceed with transfer to [facility] as already scheduled.based on discussion from yesterday.  Healthcare decisions would need to be discussed with patient's daughter who is involved in his care.  Pericardiocentesis could be performed for diagnostic and prognostic purposes, perhaps even a PYP scan, SPEP and UPEP.  This would potentially help guide reasonable treatment options and better understand prognosis.  We will give him an additional dose of  Lasix today.  Blood pressure is stable.  Satira Sark, M.D., F.A.C.C.

## 2021-05-29 DIAGNOSIS — I5043 Acute on chronic combined systolic (congestive) and diastolic (congestive) heart failure: Secondary | ICD-10-CM

## 2021-05-29 DIAGNOSIS — I714 Abdominal aortic aneurysm, without rupture: Secondary | ICD-10-CM | POA: Diagnosis not present

## 2021-05-29 DIAGNOSIS — I10 Essential (primary) hypertension: Secondary | ICD-10-CM

## 2021-05-29 DIAGNOSIS — I313 Pericardial effusion (noninflammatory): Secondary | ICD-10-CM

## 2021-05-29 DIAGNOSIS — E039 Hypothyroidism, unspecified: Secondary | ICD-10-CM

## 2021-05-29 DIAGNOSIS — I3139 Other pericardial effusion (noninflammatory): Secondary | ICD-10-CM

## 2021-05-29 DIAGNOSIS — E785 Hyperlipidemia, unspecified: Secondary | ICD-10-CM | POA: Diagnosis not present

## 2021-05-29 DIAGNOSIS — I5031 Acute diastolic (congestive) heart failure: Secondary | ICD-10-CM

## 2021-05-29 LAB — CBC
HCT: 30.1 % — ABNORMAL LOW (ref 39.0–52.0)
Hemoglobin: 9.7 g/dL — ABNORMAL LOW (ref 13.0–17.0)
MCH: 28.1 pg (ref 26.0–34.0)
MCHC: 32.2 g/dL (ref 30.0–36.0)
MCV: 87.2 fL (ref 80.0–100.0)
Platelets: 203 10*3/uL (ref 150–400)
RBC: 3.45 MIL/uL — ABNORMAL LOW (ref 4.22–5.81)
RDW: 12.4 % (ref 11.5–15.5)
WBC: 4.1 10*3/uL (ref 4.0–10.5)
nRBC: 0 % (ref 0.0–0.2)

## 2021-05-29 LAB — PHENYTOIN LEVEL, TOTAL: Phenytoin Lvl: 15.5 ug/mL (ref 10.0–20.0)

## 2021-05-29 LAB — BASIC METABOLIC PANEL
Anion gap: 9 (ref 5–15)
BUN: 32 mg/dL — ABNORMAL HIGH (ref 8–23)
CO2: 23 mmol/L (ref 22–32)
Calcium: 9 mg/dL (ref 8.9–10.3)
Chloride: 105 mmol/L (ref 98–111)
Creatinine, Ser: 2.75 mg/dL — ABNORMAL HIGH (ref 0.61–1.24)
GFR, Estimated: 25 mL/min — ABNORMAL LOW (ref >60)
Glucose, Bld: 97 mg/dL (ref 70–99)
Potassium: 4.4 mmol/L (ref 3.5–5.1)
Sodium: 137 mmol/L (ref 135–145)

## 2021-05-29 LAB — MAGNESIUM: Magnesium: 1.8 mg/dL (ref 1.7–2.4)

## 2021-05-29 NOTE — Progress Notes (Addendum)
Progress Note  Patient Name: Kurt Nelson Date of Encounter: 05/29/2021  [facility] Cardiologist: Rozann Lesches, MD   Subjective   Pt without complaints this morning. Slightly difficult to understand speech. No CP, SOB, or lower extremity swelling.  Inpatient Medications    Scheduled Meds:  amLODipine  10 mg Oral Daily   aspirin  81 mg Oral Daily   atorvastatin  40 mg Oral Daily   calcitRIOL  0.25 mcg Oral Once per day on Mon Wed Fri   cholecalciferol  1,000 Units Oral Daily   cloNIDine  0.2 mg Oral BID   dorzolamide-timolol  1 drop Left Eye BID   heparin  5,000 Units Subcutaneous Q8H   labetalol  600 mg Oral BID   latanoprost  1 drop Both Eyes QHS   levothyroxine  125 mcg Oral Q0600   losartan  12.5 mg Oral Daily   minoxidil  2.5 mg Oral BID   olopatadine  1 drop Both Eyes Daily   omega-3 acid ethyl esters  2 g Oral Daily   pantoprazole  40 mg Oral Daily   phenytoin  300 mg Oral QHS   sodium bicarbonate  650 mg Oral BID   sodium chloride flush  3 mL Intravenous Q12H   Continuous Infusions:  sodium chloride     PRN Meds: sodium chloride, acetaminophen **OR** acetaminophen, labetalol, ondansetron **OR** ondansetron (ZOFRAN) IV, sodium chloride flush   Vital Signs    Vitals:   05/28/21 1604 05/28/21 2055 05/29/21 0438 05/29/21 0811  BP: (!) 159/85 (!) 146/96 131/72 132/85  Pulse: 77 84 81 86  Resp:  '18 18 18  '$ Temp: 98.8 F (37.1 C) 98.2 F (36.8 C) 98.4 F (36.9 C) 98.9 F (37.2 C)  TempSrc: Oral Oral Oral Oral  SpO2: 97% 95% 93% 94%  Weight:      Height:        Intake/Output Summary (Last 24 hours) at 05/29/2021 0949 Last data filed at 05/29/2021 [id number] Gross per 24 hour  Intake 720 ml  Output 950 ml  Net -230 ml   Last 3 Weights 05/28/2021 05/27/2021 05/26/2021  Weight (lbs) 128 lb 12 oz 128 lb 8.5 oz 133 lb 6.1 oz  Weight (kg) 58.4 kg 58.3 kg 60.5 kg      Telemetry    Sinus rhythm in the 70s - Personally Reviewed  ECG    No new tracings  - Personally Reviewed  Physical Exam   GEN: No acute distress.   Neck: No JVD Cardiac: RRR, no rub, bounding Respiratory: Clear to auscultation bilaterally. GI: Soft, nontender, non-distended  MS: No edema; No deformity. Neuro:  Nonfocal  Psych: Normal affect   Labs    High Sensitivity Troponin:   Recent Labs  Lab 05/26/21 1100 05/26/21 1510  TROPONINIHS 25* 27*     Chemistry Recent Labs  Lab 05/26/21 1057 05/27/21 0505 05/28/21 0505 05/29/21 0419  NA 139 141 140 137  K 4.8 4.4 4.4 4.4  CL 108 108 108 105  CO2 '24 26 24 23  '$ GLUCOSE 103* 84 89 97  BUN 27* 34* 34* 32*  CREATININE 2.47* 2.59* 2.58* 2.75*  CALCIUM 9.1 8.8* 8.9 9.0  MG  --  1.9 2.0 1.8  PROT 7.7  --   --   --   ALBUMIN 3.8  --   --   --   AST 22  --   --   --   ALT 23  --   --   --  ALKPHOS 220*  --   --   --   BILITOT 0.3  --   --   --   GFRNONAA 28* 27* 27* 25*  ANIONGAP '7 7 8 9    '$ Lipids No results for input(s): CHOL, TRIG, HDL, LABVLDL, LDLCALC, CHOLHDL in the last 168 hours.  Hematology Recent Labs  Lab 05/27/21 0505 05/27/21 1222 05/28/21 0505 05/29/21 0419  WBC 4.3  --  4.0 4.1  RBC 3.43* 3.43* 3.29* 3.45*  HGB 9.8*  --  9.5* 9.7*  HCT 31.1*  --  29.8* 30.1*  MCV 90.7  --  90.6 87.2  MCH 28.6  --  28.9 28.1  MCHC 31.5  --  31.9 32.2  RDW 12.6  --  12.4 12.4  PLT 208  --  207 203   Thyroid No results for input(s): TSH, FREET4 in the last 168 hours.  BNP Recent Labs  Lab 05/26/21 1057  BNP 954.0*    DDimer No results for input(s): DDIMER in the last 168 hours.   Radiology    ECHOCARDIOGRAM COMPLETE  Result Date: 05/27/2021    ECHOCARDIOGRAM REPORT   Patient Name:   Kurt Nelson Date of Exam: 05/27/2021 Medical Rec #:  [id number]    Height:       67.0 in Accession #:    [id number]   Weight:       128.5 lb Date of Birth:  Jul 20, 1956   BSA:          1.676 m Patient Age:    47 years     BP:           145/83 mmHg Patient Gender: M            HR:           87 bpm. Exam Location:   [facility] Procedure: 2D Echo, Cardiac Doppler and Color Doppler STAT ECHO Indications:    CHF  History:        Patient has no prior history of Echocardiogram examinations.                 CHF, Signs/Symptoms:AAA and Shortness of Breath; Risk                 Factors:Hypertension and Dyslipidemia. Cocaine abuse, ETOH                 abuse, LE edema, CKD.  Sonographer:    Dustin Flock RDCS Referring Phys: [id number] Royanne Foots [place]  1. Consider amyloidosis.  2. Left ventricular ejection fraction, by estimation, is 60 to 65%. The left ventricle has normal function. The left ventricle has no regional wall motion abnormalities. There is severe concentric left ventricular hypertrophy. Left ventricular diastolic  parameters are consistent with Grade II diastolic dysfunction (pseudonormalization).  3. Right ventricular systolic function is normal. The right ventricular size is normal. There is normal pulmonary artery systolic pressure. The estimated right ventricular systolic pressure is Q000111Q mmHg.  4. 5 cm pericardial effusion. Very large. Although there is no RV/ RA collpase, pericardial tamponade remains a clinical bedside diagnosis. Large pericardial effusion. The pericardial effusion is circumferential.  5. The mitral valve is normal in structure. No evidence of mitral valve regurgitation. No evidence of mitral stenosis.  6. The aortic valve is normal in structure. Aortic valve regurgitation is not visualized. No aortic stenosis is present.  7. The inferior vena cava is normal in size with greater than 50% respiratory variability, suggesting right atrial pressure  of 3 mmHg. Conclusion(s)/Recommendation(s): Findings consistent with hypertrophic cardiomyopathy. FINDINGS  Left Ventricle: Left ventricular ejection fraction, by estimation, is 60 to 65%. The left ventricle has normal function. The left ventricle has no regional wall motion abnormalities. The left ventricular internal cavity size was normal in  size. There is  severe concentric left ventricular hypertrophy. Left ventricular diastolic parameters are consistent with Grade II diastolic dysfunction (pseudonormalization). Right Ventricle: The right ventricular size is normal. No increase in right ventricular wall thickness. Right ventricular systolic function is normal. There is normal pulmonary artery systolic pressure. The tricuspid regurgitant velocity is 2.76 m/s, and  with an assumed right atrial pressure of 3 mmHg, the estimated right ventricular systolic pressure is Q000111Q mmHg. Left Atrium: Left atrial size was normal in size. Right Atrium: Right atrial size was normal in size. Pericardium: 5 cm pericardial effusion. Very large. Although there is no RV/ RA collpase, pericardial tamponade remains a clinical bedside diagnosis. A large pericardial effusion is present. The pericardial effusion is circumferential. Mitral Valve: The mitral valve is normal in structure. No evidence of mitral valve regurgitation. No evidence of mitral valve stenosis. Tricuspid Valve: The tricuspid valve is normal in structure. Tricuspid valve regurgitation is trivial. No evidence of tricuspid stenosis. Aortic Valve: The aortic valve is normal in structure. Aortic valve regurgitation is not visualized. No aortic stenosis is present. Pulmonic Valve: The pulmonic valve was normal in structure. Pulmonic valve regurgitation is not visualized. No evidence of pulmonic stenosis. Aorta: The aortic root is normal in size and structure. Venous: The inferior vena cava is normal in size with greater than 50% respiratory variability, suggesting right atrial pressure of 3 mmHg. IAS/Shunts: No atrial level shunt detected by color flow Doppler. Additional Comments: Consider amyloidosis.  LEFT VENTRICLE PLAX 2D LVIDd:         4.20 cm     Diastology LVIDs:         2.70 cm     LV e' medial:    4.20 cm/s LV PW:         1.50 cm     LV E/e' medial:  13.8 LV IVS:        1.40 cm     LV e' lateral:   4.51  cm/s LVOT diam:     2.10 cm     LV E/e' lateral: 12.9 LV SV:         69 LV SV Index:   41 LVOT Area:     3.46 cm  LV Volumes (MOD) LV vol d, MOD A4C: 70.8 ml LV vol s, MOD A4C: 20.0 ml LV SV MOD A4C:     70.8 ml RIGHT VENTRICLE RV Basal diam:  2.70 cm RV S prime:     11.50 cm/s TAPSE (M-mode): 2.2 cm LEFT ATRIUM             Index       RIGHT ATRIUM           Index LA diam:        4.10 cm 2.45 cm/m  RA Area:     11.30 cm LA Vol (A2C):   96.8 ml 57.76 ml/m RA Volume:   27.10 ml  16.17 ml/m LA Vol (A4C):   44.7 ml 26.67 ml/m LA Biplane Vol: 69.8 ml 41.65 ml/m  AORTIC VALVE LVOT Vmax:   127.00 cm/s LVOT Vmean:  76.900 cm/s LVOT VTI:    0.199 m  AORTA Ao Root diam: 2.80 cm MITRAL VALVE  TRICUSPID VALVE MV Area (PHT): 3.65 cm    TR Peak grad:   30.5 mmHg MV Decel Time: 208 msec    TR Vmax:        276.00 cm/s MV E velocity: 58.00 cm/s MV A velocity: 45.40 cm/s  SHUNTS MV E/A ratio:  1.28        Systemic VTI:  0.20 m                            Systemic Diam: 2.10 cm Candee Furbish MD Electronically signed by Candee Furbish MD Signature Date/Time: 05/27/2021/11:35:46 AM    Final     Cardiac Studies   Echo 05/27/21: 1. Consider amyloidosis.   2. Left ventricular ejection fraction, by estimation, is 60 to 65%. The  left ventricle has normal function. The left ventricle has no regional  wall motion abnormalities. There is severe concentric left ventricular  hypertrophy. Left ventricular diastolic   parameters are consistent with Grade II diastolic dysfunction  (pseudonormalization).   3. Right ventricular systolic function is normal. The right ventricular  size is normal. There is normal pulmonary artery systolic pressure. The  estimated right ventricular systolic pressure is Q000111Q mmHg.   4. 5 cm pericardial effusion. Very large. Although there is no RV/ RA  collpase, pericardial tamponade remains a clinical bedside diagnosis.  Large pericardial effusion. The pericardial effusion is  circumferential.   5. The mitral valve is normal in structure. No evidence of mitral valve  regurgitation. No evidence of mitral stenosis.   6. The aortic valve is normal in structure. Aortic valve regurgitation is  not visualized. No aortic stenosis is present.   7. The inferior vena cava is normal in size with greater than 50%  respiratory variability, suggesting right atrial pressure of 3 mmHg.   Patient Profile     65 y.o. male with a hx of AAA 3.1 cm, history of CVA, HTN, CKD stage 3b, HLD , tobacco abuse, prior cocaine use, who is being seen 05/28/2021 for the evaluation of pericardial effusion at the request of Dr. Manuella Ghazi.  Assessment & Plan    Large pericardial effusion Suspected amyloid Severe LVH - tamponade physiology not clear on echo - BP stable, HR 70s - will review with attending pericardiocentesis vs window - will make NPO - no concern for tamponade clinically    Acute on chronic diastolic diastolic heart failure - presented with worsening lower extremity edema and mild SOB x 2-3 days - has received 40 mg IV lasix x 2, 20 mg IV lasix x 1, 20 mg lasix PO x 1 - does not appear volume up on exam - no lasix due at this time   Hypertension - continue amlodipine 10 mg, clonidine 0.2 mg BID, 600 mg labetalol BID, 2.5 mg minoxidil, 12.5 mg losartan   CKD stage IV - sCr 2.75 (2.58)   Hyperlipidemia  - continue statin, lovaza - collect lipid panel   Hx of CVA - continue ASA and statin      For questions or updates, please contact [facility] Please consult [email or web address] for contact info under        Signed, Ledora Bottcher, PA  05/29/2021, 9:49 AM    History and all data above reviewed.  Patient examined.  I agree with the findings as above.  He has had no chest pain. He does have some dyspnea and lower extremity edema.  The patient exam reveals COR:RRR, no rub  ,  Lungs: Clear  ,  Abd: Positive bowel sounds, no rebound no guarding, Ext No edema  .   All available labs, radiology testing, previous records reviewed. Agree with documented assessment and plan. Pericardial effusion:  I have reviewed the echo and he has a large effusion.  I think that a pericardiocentesis is in order and we will contact his daughter to discuss as well.  He would be in agreement.  We can scheduled for tomorrow.  LVH:  I will order PYP and SPEP/UPEP.    Jeneen Rinks Kewanda [place]  11:34 AM  05/29/2021

## 2021-05-29 NOTE — H&P (View-Only) (Signed)
Progress Note  Patient Name: Kurt Nelson Date of Encounter: 05/29/2021  [facility] Cardiologist: Rozann Lesches, MD   Subjective   Pt without complaints this morning. Slightly difficult to understand speech. No CP, SOB, or lower extremity swelling.  Inpatient Medications    Scheduled Meds:  amLODipine  10 mg Oral Daily   aspirin  81 mg Oral Daily   atorvastatin  40 mg Oral Daily   calcitRIOL  0.25 mcg Oral Once per day on Mon Wed Fri   cholecalciferol  1,000 Units Oral Daily   cloNIDine  0.2 mg Oral BID   dorzolamide-timolol  1 drop Left Eye BID   heparin  5,000 Units Subcutaneous Q8H   labetalol  600 mg Oral BID   latanoprost  1 drop Both Eyes QHS   levothyroxine  125 mcg Oral Q0600   losartan  12.5 mg Oral Daily   minoxidil  2.5 mg Oral BID   olopatadine  1 drop Both Eyes Daily   omega-3 acid ethyl esters  2 g Oral Daily   pantoprazole  40 mg Oral Daily   phenytoin  300 mg Oral QHS   sodium bicarbonate  650 mg Oral BID   sodium chloride flush  3 mL Intravenous Q12H   Continuous Infusions:  sodium chloride     PRN Meds: sodium chloride, acetaminophen **OR** acetaminophen, labetalol, ondansetron **OR** ondansetron (ZOFRAN) IV, sodium chloride flush   Vital Signs    Vitals:   05/28/21 1604 05/28/21 2055 05/29/21 0438 05/29/21 0811  BP: (!) 159/85 (!) 146/96 131/72 132/85  Pulse: 77 84 81 86  Resp:  '18 18 18  '$ Temp: 98.8 F (37.1 C) 98.2 F (36.8 C) 98.4 F (36.9 C) 98.9 F (37.2 C)  TempSrc: Oral Oral Oral Oral  SpO2: 97% 95% 93% 94%  Weight:      Height:        Intake/Output Summary (Last 24 hours) at 05/29/2021 0949 Last data filed at 05/29/2021 [id number] Gross per 24 hour  Intake 720 ml  Output 950 ml  Net -230 ml   Last 3 Weights 05/28/2021 05/27/2021 05/26/2021  Weight (lbs) 128 lb 12 oz 128 lb 8.5 oz 133 lb 6.1 oz  Weight (kg) 58.4 kg 58.3 kg 60.5 kg      Telemetry    Sinus rhythm in the 70s - Personally Reviewed  ECG    No new tracings  - Personally Reviewed  Physical Exam   GEN: No acute distress.   Neck: No JVD Cardiac: RRR, no rub, bounding Respiratory: Clear to auscultation bilaterally. GI: Soft, nontender, non-distended  MS: No edema; No deformity. Neuro:  Nonfocal  Psych: Normal affect   Labs    High Sensitivity Troponin:   Recent Labs  Lab 05/26/21 1100 05/26/21 1510  TROPONINIHS 25* 27*     Chemistry Recent Labs  Lab 05/26/21 1057 05/27/21 0505 05/28/21 0505 05/29/21 0419  NA 139 141 140 137  K 4.8 4.4 4.4 4.4  CL 108 108 108 105  CO2 '24 26 24 23  '$ GLUCOSE 103* 84 89 97  BUN 27* 34* 34* 32*  CREATININE 2.47* 2.59* 2.58* 2.75*  CALCIUM 9.1 8.8* 8.9 9.0  MG  --  1.9 2.0 1.8  PROT 7.7  --   --   --   ALBUMIN 3.8  --   --   --   AST 22  --   --   --   ALT 23  --   --   --  ALKPHOS 220*  --   --   --   BILITOT 0.3  --   --   --   GFRNONAA 28* 27* 27* 25*  ANIONGAP '7 7 8 9    '$ Lipids No results for input(s): CHOL, TRIG, HDL, LABVLDL, LDLCALC, CHOLHDL in the last 168 hours.  Hematology Recent Labs  Lab 05/27/21 0505 05/27/21 1222 05/28/21 0505 05/29/21 0419  WBC 4.3  --  4.0 4.1  RBC 3.43* 3.43* 3.29* 3.45*  HGB 9.8*  --  9.5* 9.7*  HCT 31.1*  --  29.8* 30.1*  MCV 90.7  --  90.6 87.2  MCH 28.6  --  28.9 28.1  MCHC 31.5  --  31.9 32.2  RDW 12.6  --  12.4 12.4  PLT 208  --  207 203   Thyroid No results for input(s): TSH, FREET4 in the last 168 hours.  BNP Recent Labs  Lab 05/26/21 1057  BNP 954.0*    DDimer No results for input(s): DDIMER in the last 168 hours.   Radiology    ECHOCARDIOGRAM COMPLETE  Result Date: 05/27/2021    ECHOCARDIOGRAM REPORT   Patient Name:   Kurt Nelson Date of Exam: 05/27/2021 Medical Rec #:  [id number]    Height:       67.0 in Accession #:    [id number]   Weight:       128.5 lb Date of Birth:  [birth year]   BSA:          1.676 m Patient Age:    65 years     BP:           145/83 mmHg Patient Gender: M            HR:           87 bpm. [facility]am Location:   [facility] Procedure: 2D Echo, Cardiac Doppler and Color Doppler STAT ECHO Indications:    CHF  History:        Patient has no prior history of Echocardiogram examinations.                 CHF, Signs/Symptoms:AAA and Shortness of Breath; Risk                 Factors:Hypertension and Dyslipidemia. Cocaine abuse, ETOH                 abuse, LE edema, CKD.  Sonographer:    Dustin Flock RDCS Referring Phys: [id number] Royanne Foots [place]  1. Consider amyloidosis.  2. Left ventricular ejection fraction, by estimation, is 60 to 65%. The left ventricle has normal function. The left ventricle has no regional wall motion abnormalities. There is severe concentric left ventricular hypertrophy. Left ventricular diastolic  parameters are consistent with Grade II diastolic dysfunction (pseudonormalization).  3. Right ventricular systolic function is normal. The right ventricular size is normal. There is normal pulmonary artery systolic pressure. The estimated right ventricular systolic pressure is Q000111Q mmHg.  4. 5 cm pericardial effusion. Very large. Although there is no RV/ RA collpase, pericardial tamponade remains a clinical bedside diagnosis. Large pericardial effusion. The pericardial effusion is circumferential.  5. The mitral valve is normal in structure. No evidence of mitral valve regurgitation. No evidence of mitral stenosis.  6. The aortic valve is normal in structure. Aortic valve regurgitation is not visualized. No aortic stenosis is present.  7. The inferior vena cava is normal in size with greater than 50% respiratory variability, suggesting right atrial pressure  of 3 mmHg. Conclusion(s)/Recommendation(s): Findings consistent with hypertrophic cardiomyopathy. FINDINGS  Left Ventricle: Left ventricular ejection fraction, by estimation, is 60 to 65%. The left ventricle has normal function. The left ventricle has no regional wall motion abnormalities. The left ventricular internal cavity size was normal in  size. There is  severe concentric left ventricular hypertrophy. Left ventricular diastolic parameters are consistent with Grade II diastolic dysfunction (pseudonormalization). Right Ventricle: The right ventricular size is normal. No increase in right ventricular wall thickness. Right ventricular systolic function is normal. There is normal pulmonary artery systolic pressure. The tricuspid regurgitant velocity is 2.76 m/s, and  with an assumed right atrial pressure of 3 mmHg, the estimated right ventricular systolic pressure is Q000111Q mmHg. Left Atrium: Left atrial size was normal in size. Right Atrium: Right atrial size was normal in size. Pericardium: 5 cm pericardial effusion. Very large. Although there is no RV/ RA collpase, pericardial tamponade remains a clinical bedside diagnosis. A large pericardial effusion is present. The pericardial effusion is circumferential. Mitral Valve: The mitral valve is normal in structure. No evidence of mitral valve regurgitation. No evidence of mitral valve stenosis. Tricuspid Valve: The tricuspid valve is normal in structure. Tricuspid valve regurgitation is trivial. No evidence of tricuspid stenosis. Aortic Valve: The aortic valve is normal in structure. Aortic valve regurgitation is not visualized. No aortic stenosis is present. Pulmonic Valve: The pulmonic valve was normal in structure. Pulmonic valve regurgitation is not visualized. No evidence of pulmonic stenosis. Aorta: The aortic root is normal in size and structure. Venous: The inferior vena cava is normal in size with greater than 50% respiratory variability, suggesting right atrial pressure of 3 mmHg. IAS/Shunts: No atrial level shunt detected by color flow Doppler. Additional Comments: Consider amyloidosis.  LEFT VENTRICLE PLAX 2D LVIDd:         4.20 cm     Diastology LVIDs:         2.70 cm     LV e' medial:    4.20 cm/s LV PW:         1.50 cm     LV E/e' medial:  13.8 LV IVS:        1.40 cm     LV e' lateral:   4.51  cm/s LVOT diam:     2.10 cm     LV E/e' lateral: 12.9 LV SV:         69 LV SV Index:   41 LVOT Area:     3.46 cm  LV Volumes (MOD) LV vol d, MOD A4C: 70.8 ml LV vol s, MOD A4C: 20.0 ml LV SV MOD A4C:     70.8 ml RIGHT VENTRICLE RV Basal diam:  2.70 cm RV S prime:     11.50 cm/s TAPSE (M-mode): 2.2 cm LEFT ATRIUM             Index       RIGHT ATRIUM           Index LA diam:        4.10 cm 2.45 cm/m  RA Area:     11.30 cm LA Vol (A2C):   96.8 ml 57.76 ml/m RA Volume:   27.10 ml  16.17 ml/m LA Vol (A4C):   44.7 ml 26.67 ml/m LA Biplane Vol: 69.8 ml 41.65 ml/m  AORTIC VALVE LVOT Vmax:   127.00 cm/s LVOT Vmean:  76.900 cm/s LVOT VTI:    0.199 m  AORTA Ao Root diam: 2.80 cm MITRAL VALVE  TRICUSPID VALVE MV Area (PHT): 3.65 cm    TR Peak grad:   30.5 mmHg MV Decel Time: 208 msec    TR Vmax:        276.00 cm/s MV E velocity: 58.00 cm/s MV A velocity: 45.40 cm/s  SHUNTS MV E/A ratio:  1.28        Systemic VTI:  0.20 m                            Systemic Diam: 2.10 cm Candee Furbish MD Electronically signed by Candee Furbish MD Signature Date/Time: 05/27/2021/11:35:46 AM    Final     Cardiac Studies   Echo 05/27/21: 1. Consider amyloidosis.   2. Left ventricular ejection fraction, by estimation, is 60 to 65%. The  left ventricle has normal function. The left ventricle has no regional  wall motion abnormalities. There is severe concentric left ventricular  hypertrophy. Left ventricular diastolic   parameters are consistent with Grade II diastolic dysfunction  (pseudonormalization).   3. Right ventricular systolic function is normal. The right ventricular  size is normal. There is normal pulmonary artery systolic pressure. The  estimated right ventricular systolic pressure is Q000111Q mmHg.   4. 5 cm pericardial effusion. Very large. Although there is no RV/ RA  collpase, pericardial tamponade remains a clinical bedside diagnosis.  Large pericardial effusion. The pericardial effusion is  circumferential.   5. The mitral valve is normal in structure. No evidence of mitral valve  regurgitation. No evidence of mitral stenosis.   6. The aortic valve is normal in structure. Aortic valve regurgitation is  not visualized. No aortic stenosis is present.   7. The inferior vena cava is normal in size with greater than 50%  respiratory variability, suggesting right atrial pressure of 3 mmHg.   Patient Profile     65 y.o. male with a hx of AAA 3.1 cm, history of CVA, HTN, CKD stage 3b, HLD , tobacco abuse, prior cocaine use, who is being seen 05/28/2021 for the evaluation of pericardial effusion at the request of Dr. Manuella Ghazi.  Assessment & Plan    Large pericardial effusion Suspected amyloid Severe LVH - tamponade physiology not clear on echo - BP stable, HR 70s - will review with attending pericardiocentesis vs window - will make NPO - no concern for tamponade clinically    Acute on chronic diastolic diastolic heart failure - presented with worsening lower extremity edema and mild SOB x 2-3 days - has received 40 mg IV lasix x 2, 20 mg IV lasix x 1, 20 mg lasix PO x 1 - does not appear volume up on exam - no lasix due at this time   Hypertension - continue amlodipine 10 mg, clonidine 0.2 mg BID, 600 mg labetalol BID, 2.5 mg minoxidil, 12.5 mg losartan   CKD stage IV - sCr 2.75 (2.58)   Hyperlipidemia  - continue statin, lovaza - collect lipid panel   Hx of CVA - continue ASA and statin      For questions or updates, please contact [facility] Please consult [email or web address] for contact info under        Signed, Ledora Bottcher, PA  05/29/2021, 9:49 AM    History and all data above reviewed.  Patient examined.  I agree with the findings as above.  He has had no chest pain. He does have some dyspnea and lower extremity edema.  The patient exam reveals COR:RRR, no rub  ,  Lungs: Clear  ,  Abd: Positive bowel sounds, no rebound no guarding, Ext No edema  .   All available labs, radiology testing, previous records reviewed. Agree with documented assessment and plan. Pericardial effusion:  I have reviewed the echo and he has a large effusion.  I think that a pericardiocentesis is in order and we will contact his daughter to discuss as well.  He would be in agreement.  We can scheduled for tomorrow.  LVH:  I will order PYP and SPEP/UPEP.    Jeneen Rinks Bea[place] Jarrells  11:34 AM  05/29/2021

## 2021-05-29 NOTE — Progress Notes (Addendum)
Patient NPO after breakfast for possible procedure today. IR called to inform me that testing will be done tomorrow to test for amyloidosis. Dr. Cherlyn Cushing note had pericardiocentesis will be tomorrow. Called for diet order. Reviewed testing for tomorrow with patient and verbalized understanding

## 2021-05-29 NOTE — Progress Notes (Signed)
PROGRESS NOTE    Kurt Nelson  [id number] DOB: [birth year] DOA: 05/26/2021 PCP: [name], MD    Brief Narrative:  Kurt Nelson was admitted to the hospital with the working diagnosis of acute on chronic diastolic heart failure exacerbation in the setting of new large pericardial effusion.   65 year old male past medical history for aortic abdominal aneurysm, hypertension, GERD, chronic kidney disease stage IV and dyslipidemia who presented with lower extremity edema.  Reported 2-3 days of worsening lower extremity edema, associated with dyspnea, no orthopnea or PND.  On his initial physical examination blood pressure 164/110, 116/107, heart rate 104, respiratory rate 16, oxygen saturation 96%, his lungs were clear to auscultation bilaterally, heart S1-S2, present, rhythmic, soft abdomen, positive 2+ pitting bilateral extremity edema (mid leg).  Sodium 139, potassium 4.8, chloride 108, bicarb 24, glucose 103, BUN 27, creatinine 2.47, BNP 954, high sensitive troponin 25-27, white count 5.0, hemoglobin 10.9, hematocrit 34.6, platelets 216. SARS COVID-19 negative.  Urinalysis specific gravity 1.012, 100 protein.  Her chest radiograph has significant cardiomegaly, rounded borders suggesting pericardial effusion.   EKG 73 bpm, normal axis, normal intervals, sinus rhythm, no significant ST segment changes, negative T wave lead I-aVL, V3-V6, chronic changes.  Patient underwent further work-up with echocardiography showing a large pericardial effusion, no tamponade physiology.  He was transferred to [facility] from [facility] for further pericardiocentesis.  Assessment & Plan:   Principal Problem:   Pericardial effusion Active Problems:   AAA (abdominal aortic aneurysm) without rupture (HCC)   Chronic kidney disease, stage IV (severe) (HCC)   Acute CHF (HCC)   Essential hypertension   Hypothyroid   Dyslipidemia   Acute on chronic diastolic heart failure exacerbation in the setting of  new large pericardial effusion, no tamponade. Patient  has remained hemodynamically stable, his systolic blood pressure has been 130 to [id number] mmHG systolic.  Today with no signs of significant volume overload.  Documented urine output over last 24 hrs is 950 ml.   Plan to continue close hemodynamic monitoring, pericardiocentesis tomorrow. Change npo to past midnight.  Further work up with cardiac MRI per cardiology recommendations.   2. HTN hypertensive emergency (difficult to control hypertension) Patient is preload dependent in the setting of large pericardial effusion. Continue blood pressure medications with caution, patient is on amlodipine, losartan, labetalol,clonidine and minoxidil.    3. CKD stage IV renal function with serum cr at 2.75 with K at 4,4 and serum bicarbonate at 23. Continue close monitoring renal function. Hold on diuresis for today.  Discontinue oral bicarb, continue calcitriol and cholecalciferol.   4. Hypothyroid. Continue with levothyroxine   5. GERD continue with pantoprazole   6. AAA repair. Dyslipidemia. Continue with aspirin and statin therapy.   7. Hx of CVA/ seizures.  Continue statin and asa,  On phenytoin, check levels.   Patient continue to be at high risk for worsening pericardial effusion   Status is: Inpatient  Remains inpatient appropriate because:IV treatments appropriate due to intensity of illness or inability to take PO  Dispo: The patient is from: Home              Anticipated d/c is to: Home              Patient currently is not medically stable to d/c.   Difficult to place patient No   DVT prophylaxis: Heparin   Code Status:   full  Family Communication:  No family at the bedside     Consultants:  Cardiology   Subjective: Patient with no nausea or vomiting, no chest pain, dyspnea and lower extremity edema have improved.   Objective: Vitals:   05/29/21 1030 05/29/21 1032 05/29/21 1238 05/29/21 1515  BP: (!) 145/103 (!)  145/103 117/86 (!) 153/95  Pulse:  79 68 80  Resp:   16 18  Temp:   98.1 F (36.7 C) 97.8 F (36.6 C)  TempSrc:   Oral Oral  SpO2:   97% 99%  Weight:      Height:        Intake/Output Summary (Last 24 hours) at 05/29/2021 1550 Last data filed at 05/29/2021 1045 Gross per 24 hour  Intake 480 ml  Output 275 ml  Net 205 ml   Filed Weights   05/26/21 1847 05/27/21 0530 05/28/21 0500  Weight: 60.5 kg 58.3 kg 58.4 kg    Examination:   General: Not in pain or dyspnea, deconditioned  Neurology: Awake and alert, non focal  E ENT: no pallor, no icterus, oral mucosa moist Cardiovascular: mild JVD. S1-S2 present, rhythmic, no gallops, rubs, or murmurs. No lower extremity edema. Pulmonary: positive breath sounds bilaterally, adequate air movement, no wheezing, rhonchi or rales. Gastrointestinal. Abdomen soft and non tender Skin. No rashes Musculoskeletal: no joint deformities     Data Reviewed: I have personally reviewed following labs and imaging studies  CBC: Recent Labs  Lab 05/26/21 1057 05/27/21 0505 05/28/21 0505 05/29/21 0419  WBC 5.0 4.3 4.0 4.1  NEUTROABS 3.3  --   --   --   HGB 10.9* 9.8* 9.5* 9.7*  HCT 34.6* 31.1* 29.8* 30.1*  MCV 92.5 90.7 90.6 87.2  PLT 216 208 207 [id number]   Basic Metabolic Panel: Recent Labs  Lab 05/26/21 1057 05/27/21 0505 05/28/21 0505 05/29/21 0419  NA 139 141 140 137  K 4.8 4.4 4.4 4.4  CL 108 108 108 105  CO2 '24 26 24 23  '$ GLUCOSE 103* 84 89 97  BUN 27* 34* 34* 32*  CREATININE 2.47* 2.59* 2.58* 2.75*  CALCIUM 9.1 8.8* 8.9 9.0  MG  --  1.9 2.0 1.8   GFR: Estimated Creatinine Clearance: 22.1 mL/min (A) (by C-G formula based on SCr of 2.75 mg/dL (H)). Liver Function Tests: Recent Labs  Lab 05/26/21 1057  AST 22  ALT 23  ALKPHOS 220*  BILITOT 0.3  PROT 7.7  ALBUMIN 3.8   No results for input(s): LIPASE, AMYLASE in the last 168 hours. No results for input(s): AMMONIA in the last 168 hours. Coagulation Profile: No  results for input(s): INR, PROTIME in the last 168 hours. Cardiac Enzymes: No results for input(s): CKTOTAL, CKMB, CKMBINDEX, TROPONINI in the last 168 hours. BNP (last 3 results) No results for input(s): PROBNP in the last 8760 hours. HbA1C: No results for input(s): HGBA1C in the last 72 hours. CBG: No results for input(s): GLUCAP in the last 168 hours. Lipid Profile: No results for input(s): CHOL, HDL, LDLCALC, TRIG, CHOLHDL, LDLDIRECT in the last 72 hours. Thyroid Function Tests: No results for input(s): TSH, T4TOTAL, FREET4, T3FREE, THYROIDAB in the last 72 hours. Anemia Panel: Recent Labs    05/27/21 1222  VITAMINB12 165*  FOLATE 6.1  FERRITIN 24  TIBC 283  IRON 50  RETICCTPCT 1.0      Radiology Studies: I have reviewed all of the imaging during this hospital visit personally     Scheduled Meds:  amLODipine  10 mg Oral Daily   aspirin  81 mg Oral Daily   atorvastatin  40  mg Oral Daily   calcitRIOL  0.25 mcg Oral Once per day on Mon Wed Fri   cholecalciferol  1,000 Units Oral Daily   cloNIDine  0.2 mg Oral BID   dorzolamide-timolol  1 drop Left Eye BID   heparin  5,000 Units Subcutaneous Q8H   labetalol  600 mg Oral BID   latanoprost  1 drop Both Eyes QHS   levothyroxine  125 mcg Oral Q0600   losartan  12.5 mg Oral Daily   minoxidil  2.5 mg Oral BID   olopatadine  1 drop Both Eyes Daily   omega-3 acid ethyl esters  2 g Oral Daily   pantoprazole  40 mg Oral Daily   phenytoin  300 mg Oral QHS   sodium bicarbonate  650 mg Oral BID   sodium chloride flush  3 mL Intravenous Q12H   Continuous Infusions:  sodium chloride       LOS: 3 days        Kurt Oncale Gerome Apley, MD

## 2021-05-30 ENCOUNTER — Inpatient Hospital Stay (HOSPITAL_COMMUNITY): Payer: Medicare Other

## 2021-05-30 ENCOUNTER — Encounter (HOSPITAL_COMMUNITY)
Admission: EM | Disposition: A | Discharge status: Other Acute Inpatient Hospital | Payer: Self-pay | Source: Skilled Nursing Facility | Attending: Internal Medicine

## 2021-05-30 ENCOUNTER — Encounter (HOSPITAL_COMMUNITY): Payer: Self-pay | Admitting: Cardiovascular Disease

## 2021-05-30 DIAGNOSIS — I313 Pericardial effusion (noninflammatory): Secondary | ICD-10-CM

## 2021-05-30 DIAGNOSIS — I5043 Acute on chronic combined systolic (congestive) and diastolic (congestive) heart failure: Secondary | ICD-10-CM | POA: Diagnosis not present

## 2021-05-30 DIAGNOSIS — I10 Essential (primary) hypertension: Secondary | ICD-10-CM | POA: Diagnosis not present

## 2021-05-30 DIAGNOSIS — E785 Hyperlipidemia, unspecified: Secondary | ICD-10-CM | POA: Diagnosis not present

## 2021-05-30 HISTORY — PX: PERICARDIOCENTESIS: CATH118255

## 2021-05-30 LAB — BASIC METABOLIC PANEL
Anion gap: 7 (ref 5–15)
BUN: 32 mg/dL — ABNORMAL HIGH (ref 8–23)
CO2: 23 mmol/L (ref 22–32)
Calcium: 8.9 mg/dL (ref 8.9–10.3)
Chloride: 107 mmol/L (ref 98–111)
Creatinine, Ser: 2.73 mg/dL — ABNORMAL HIGH (ref 0.61–1.24)
GFR, Estimated: 25 mL/min — ABNORMAL LOW (ref >60)
Glucose, Bld: 96 mg/dL (ref 70–99)
Potassium: 4.4 mmol/L (ref 3.5–5.1)
Sodium: 137 mmol/L (ref 135–145)

## 2021-05-30 LAB — CBC WITH DIFFERENTIAL/PLATELET
Abs Immature Granulocytes: 0.01 10*3/uL (ref 0.00–0.07)
Basophils Absolute: 0 10*3/uL (ref 0.0–0.1)
Basophils Relative: 1 %
Eosinophils Absolute: 0.2 10*3/uL (ref 0.0–0.5)
Eosinophils Relative: 5 %
HCT: 29 % — ABNORMAL LOW (ref 39.0–52.0)
Hemoglobin: 9.6 g/dL — ABNORMAL LOW (ref 13.0–17.0)
Immature Granulocytes: 0 %
Lymphocytes Relative: 26 %
Lymphs Abs: 1.1 10*3/uL (ref 0.7–4.0)
MCH: 28.7 pg (ref 26.0–34.0)
MCHC: 33.1 g/dL (ref 30.0–36.0)
MCV: 86.8 fL (ref 80.0–100.0)
Monocytes Absolute: 0.4 10*3/uL (ref 0.1–1.0)
Monocytes Relative: 9 %
Neutro Abs: 2.3 10*3/uL (ref 1.7–7.7)
Neutrophils Relative %: 59 %
Platelets: 206 10*3/uL (ref 150–400)
RBC: 3.34 MIL/uL — ABNORMAL LOW (ref 4.22–5.81)
RDW: 12.3 % (ref 11.5–15.5)
WBC: 4 10*3/uL (ref 4.0–10.5)
nRBC: 0 % (ref 0.0–0.2)

## 2021-05-30 LAB — BODY FLUID CELL COUNT WITH DIFFERENTIAL
Eos, Fluid: 1 %
Lymphs, Fluid: 37 %
Monocyte-Macrophage-Serous Fluid: 40 % — ABNORMAL LOW (ref 50–90)
Neutrophil Count, Fluid: 22 % (ref 0–25)
Total Nucleated Cell Count, Fluid: 30 cu mm (ref 0–1000)

## 2021-05-30 LAB — ECHOCARDIOGRAM LIMITED
Height: 67 in
Weight: 2059.98 oz

## 2021-05-30 SURGERY — PERICARDIOCENTESIS
Anesthesia: LOCAL

## 2021-05-30 MED ORDER — VITAMIN B-12 1000 MCG PO TABS
1000.0000 ug | ORAL_TABLET | Freq: Every day | ORAL | Status: DC
  Administered 2021-05-31 – 2021-06-01 (×2): 1000 ug via ORAL
  Filled 2021-05-30 (×2): qty 1

## 2021-05-30 MED ORDER — SODIUM CHLORIDE 0.9 % IV SOLN: INTRAVENOUS | Status: DC

## 2021-05-30 MED ORDER — CLONIDINE HCL 0.1 MG PO TABS
0.1000 mg | ORAL_TABLET | Freq: Two times a day (BID) | ORAL | Status: DC
  Administered 2021-05-30: 0.1 mg via ORAL
  Filled 2021-05-30: qty 1

## 2021-05-30 MED ORDER — SODIUM CHLORIDE 0.9% FLUSH: 3.0000 mL | Freq: Two times a day (BID) | INTRAVENOUS | Status: DC

## 2021-05-30 MED ORDER — SODIUM CHLORIDE 0.9% FLUSH: 3.0000 mL | INTRAVENOUS | Status: DC | PRN

## 2021-05-30 MED ORDER — MIDAZOLAM HCL 2 MG/2ML IJ SOLN
INTRAMUSCULAR | Status: AC
  Filled 2021-05-30: qty 2

## 2021-05-30 MED ORDER — FENTANYL CITRATE (PF) 100 MCG/2ML IJ SOLN
INTRAMUSCULAR | Status: AC
  Filled 2021-05-30: qty 2

## 2021-05-30 MED ORDER — LABETALOL HCL 200 MG PO TABS
200.0000 mg | ORAL_TABLET | Freq: Two times a day (BID) | ORAL | Status: DC
  Administered 2021-05-30 – 2021-06-01 (×4): 200 mg via ORAL
  Filled 2021-05-30 (×5): qty 1

## 2021-05-30 MED ORDER — TECHNETIUM TC 99M PYROPHOSPHATE
19.9000 | Freq: Once | INTRAVENOUS | Status: AC | PRN
  Administered 2021-05-30: 19.9 via INTRAVENOUS
  Filled 2021-05-30: qty 20

## 2021-05-30 MED ORDER — LIDOCAINE HCL (PF) 1 % IJ SOLN
INTRAMUSCULAR | Status: DC | PRN
  Administered 2021-05-30: 12 mL

## 2021-05-30 MED ORDER — LIDOCAINE HCL (PF) 1 % IJ SOLN
INTRAMUSCULAR | Status: AC
  Filled 2021-05-30: qty 30

## 2021-05-30 MED ORDER — CYANOCOBALAMIN 1000 MCG/ML IJ SOLN
1000.0000 ug | Freq: Once | INTRAMUSCULAR | Status: AC
  Administered 2021-05-30: 1000 ug via INTRAMUSCULAR
  Filled 2021-05-30 (×2): qty 1

## 2021-05-30 MED ORDER — SODIUM CHLORIDE 0.9 % IV SOLN: 250.0000 mL | INTRAVENOUS | Status: DC | PRN

## 2021-05-30 MED ORDER — MIDAZOLAM HCL 2 MG/2ML IJ SOLN
INTRAMUSCULAR | Status: DC | PRN
  Administered 2021-05-30: 1 mg via INTRAVENOUS

## 2021-05-30 MED ORDER — FENTANYL CITRATE (PF) 100 MCG/2ML IJ SOLN
INTRAMUSCULAR | Status: DC | PRN
  Administered 2021-05-30: 50 ug via INTRAVENOUS

## 2021-05-30 SURGICAL SUPPLY — 5 items
PACK CARDIAC CATHETERIZATION (CUSTOM PROCEDURE TRAY) ×2 IMPLANT
PROTECTION STATION PRESSURIZED (MISCELLANEOUS) ×2
SHEATH PINNACLE 4F 10CM (SHEATH) ×2 IMPLANT
STATION PROTECTION PRESSURIZED (MISCELLANEOUS) ×1 IMPLANT
TRAY PERICARDIOCENTESIS 6FX60 (TRAY / TRAY PROCEDURE) ×2 IMPLANT

## 2021-05-30 NOTE — Interval H&P Note (Signed)
History and Physical Interval Note:  05/30/2021 10:24 AM  Kurt Nelson  has presented today for surgery, with the diagnosis of pericardial effusion.  The various methods of treatment have been discussed with the patient and family. After consideration of risks, benefits and other options for treatment, the patient has consented to  Procedure(s): PERICARDIOCENTESIS (N/A) as a surgical intervention.  The patient's history has been reviewed, patient examined, no change in status, stable for surgery.  I have reviewed the patient's chart and labs.  Questions were answered to the patient's satisfaction.     [name]

## 2021-05-30 NOTE — Progress Notes (Signed)
PROGRESS NOTE    Kurt Nelson  [id number] DOB: [birth year] DOA: 05/26/2021 PCP: [name], MD    Brief Narrative:  64/M with history of CVA, CKD 4, abdominal aortic aneurysm, hypertension, dyslipidemia presented to [facility] ED from his assisted living facility with lower extremity edema -Chest x-ray noted cardiomegaly,?  Pericardial effusion, 2D echocardiogram noted large pericardial effusion without tamponade physiology, subsequently transferred to [facility]  Assessment & Plan:   Large pericardial effusion  Acute on chronic diastolic heart failure  -Clinically do not suspect tamponade physiology -2D echo with preserved EF, grade 2 diastolic dysfunction, ?  Amyloidosis -Cardiology consulted and following, plan for pericardiocentesis today -Follow-up immunofixation -Will need cardiac MRI this admission -Hold heparin SQ  HTN (h/o difficult to control hypertension) Patient is preload dependent in the setting of large pericardial effusion. -Blood pressure soft at this time, will hold losartan, minoxidil, cut down clonidine -Continue amlodipine and labetalol   CKD stage IV -Creatinine 2.5-3 range which is his baseline -Discontinue oral bicarb, continue calcitriol and cholecalciferol.   Hypothyroid. Continue with levothyroxine   GERD continue with pantoprazole   AAA repair. Dyslipidemia. Continue with aspirin and statin therapy.    Hx of CVA/ seizures.  Continue statin and asa,  On phenytoin, check levels.   Cognitive deficits -Likely vascular following CVA, B12 is also low start replacement  DVT prophylaxis: Heparin  SQ-on hold Code Status:   full  Family Communication:  No family at the bedside     Status is: Inpatient  Remains inpatient appropriate because:IV treatments appropriate due to intensity of illness or inability to take PO  Dispo: The patient is from: Home              Anticipated d/c is to: Home              Patient currently is not medically stable to  d/c.   Difficult to place patient No   Consultants:  Cardiology   Subjective: -Feels okay, denies any chest pain or dyspnea  Objective: Vitals:   05/29/21 1238 05/29/21 1515 05/29/21 2034 05/30/21 0346  BP: 117/86 (!) 153/95 124/84 109/71  Pulse: 68 80 82 75  Resp: '16 18 18 18  '$ Temp: 98.1 F (36.7 C) 97.8 F (36.6 C) 98.4 F (36.9 C) 98.8 F (37.1 C)  TempSrc: Oral Oral Oral Oral  SpO2: 97% 99%  94%  Weight:      Height:        Intake/Output Summary (Last 24 hours) at 05/30/2021 1220 Last data filed at 05/30/2021 1137 Gross per 24 hour  Intake 120 ml  Output 100 ml  Net 20 ml   Filed Weights   05/26/21 1847 05/27/21 0530 05/28/21 0500  Weight: 60.5 kg 58.3 kg 58.4 kg    Examination:   General: Chronically ill male appears much older than stated age, AAO x2, cognitive deficits noted HEENT: Positive JVD CVS: S1-S2, regular rate rhythm Lungs: Clear bilaterally Abdomen: Soft, nontender, bowel sounds present Extremities: No edema Skin: No rash on exposed skin  Data Reviewed: I have personally reviewed following labs and imaging studies  CBC: Recent Labs  Lab 05/26/21 1057 05/27/21 0505 05/28/21 0505 05/29/21 0419 05/30/21 0235  WBC 5.0 4.3 4.0 4.1 4.0  NEUTROABS 3.3  --   --   --  2.3  HGB 10.9* 9.8* 9.5* 9.7* 9.6*  HCT 34.6* 31.1* 29.8* 30.1* 29.0*  MCV 92.5 90.7 90.6 87.2 86.8  PLT 216 208 207 203 206   Basic  Metabolic Panel: Recent Labs  Lab 05/26/21 1057 05/27/21 0505 05/28/21 0505 05/29/21 0419 05/30/21 0235  NA 139 141 140 137 137  K 4.8 4.4 4.4 4.4 4.4  CL 108 108 108 105 107  CO2 '24 26 24 23 23  '$ GLUCOSE 103* 84 89 97 96  BUN 27* 34* 34* 32* 32*  CREATININE 2.47* 2.59* 2.58* 2.75* 2.73*  CALCIUM 9.1 8.8* 8.9 9.0 8.9  MG  --  1.9 2.0 1.8  --    GFR: Estimated Creatinine Clearance: 22.3 mL/min (A) (by C-G formula based on SCr of 2.73 mg/dL (H)). Liver Function Tests: Recent Labs  Lab 05/26/21 1057  AST 22  ALT 23  ALKPHOS 220*   BILITOT 0.3  PROT 7.7  ALBUMIN 3.8   No results for input(s): LIPASE, AMYLASE in the last 168 hours. No results for input(s): AMMONIA in the last 168 hours. Coagulation Profile: No results for input(s): INR, PROTIME in the last 168 hours. Cardiac Enzymes: No results for input(s): CKTOTAL, CKMB, CKMBINDEX, TROPONINI in the last 168 hours. BNP (last 3 results) No results for input(s): PROBNP in the last 8760 hours. HbA1C: No results for input(s): HGBA1C in the last 72 hours. CBG: No results for input(s): GLUCAP in the last 168 hours. Lipid Profile: No results for input(s): CHOL, HDL, LDLCALC, TRIG, CHOLHDL, LDLDIRECT in the last 72 hours. Thyroid Function Tests: No results for input(s): TSH, T4TOTAL, FREET4, T3FREE, THYROIDAB in the last 72 hours. Anemia Panel: Recent Labs    05/27/21 1222  VITAMINB12 165*  FOLATE 6.1  FERRITIN 24  TIBC 283  IRON 50  RETICCTPCT 1.0    Scheduled Meds:  amLODipine  10 mg Oral Daily   aspirin  81 mg Oral Daily   atorvastatin  40 mg Oral Daily   calcitRIOL  0.25 mcg Oral Once per day on Mon Wed Fri   cholecalciferol  1,000 Units Oral Daily   cloNIDine  0.2 mg Oral BID   dorzolamide-timolol  1 drop Left Eye BID   heparin  5,000 Units Subcutaneous Q8H   labetalol  600 mg Oral BID   latanoprost  1 drop Both Eyes QHS   levothyroxine  125 mcg Oral Q0600   losartan  12.5 mg Oral Daily   minoxidil  2.5 mg Oral BID   olopatadine  1 drop Both Eyes Daily   omega-3 acid ethyl esters  2 g Oral Daily   pantoprazole  40 mg Oral Daily   phenytoin  300 mg Oral QHS   sodium chloride flush  3 mL Intravenous Q12H   Continuous Infusions:  sodium chloride       LOS: 4 days    Domenic Polite, MD

## 2021-05-30 NOTE — TOC Initial Note (Signed)
Transition of Care [facility]) - Initial/Assessment Note    Patient Details  Name: Kurt Nelson MRN: [id number] Date of Birth: 1955-09-27  Transition of Care [facility]) CM/SW Contact:    Trula Ore, [place] Phone Number: 05/30/2021, 11:42 AM  Clinical Narrative:                  CSW spoke with patient at bedside. Patient confirmed he comes from [place] in [place]. Patient confirmed his plan is to return to his ALF when medically ready for dc. Patient reports he has received both covid vaccines as well as booster. CSW called [facility] ALF who confirmed that patient came from there. [facility] confirmed they can accept patient back when medically ready. CSW will need to fax over dc summary, FL2, and covid result to [phone] when ready for dc. CSW will continue to follow and assist with patients dc planning needs. Expected Discharge Plan: Assisted Living Barriers to Discharge: Continued Medical Work up   Patient Goals and CMS Choice Patient states their goals for this hospitalization and ongoing recovery are:: to go back to ALF CMS Medicare.gov Compare Post Acute Care list provided to:: Patient Choice offered to / list presented to : Patient  Expected Discharge Plan and Services Expected Discharge Plan: Assisted Living In-house Referral: Clinical Social Work     Living arrangements for the past 2 months: [place] (From [facility])                                      Prior Living Arrangements/Services Living arrangements for the past 2 months: [place] (From [place]) Lives with:: Facility Resident (From [facility]) Patient language and need for interpreter reviewed:: Yes Do you feel safe going back to the place where you live?: Yes      Need for Family Participation in Patient Care: Yes (Comment) Care giver support system in place?: Yes (comment)   Criminal Activity/Legal Involvement Pertinent to Current  Situation/Hospitalization: No - Comment as needed  Activities of Daily Living Home Assistive Devices/Equipment: Environmental consultant (specify type) (Front wheel walker) ADL Screening (condition at time of admission) Patient's cognitive ability adequate to safely complete daily activities?: No Is the patient deaf or have difficulty hearing?: No Does the patient have difficulty seeing, even when wearing glasses/contacts?: No Does the patient have difficulty concentrating, remembering, or making decisions?: No Patient able to express need for assistance with ADLs?: Yes Does the patient have difficulty dressing or bathing?: Yes Independently performs ADLs?: No Communication: Independent Dressing (OT): Needs assistance Is this a change from baseline?: Change from baseline, expected to last <3days Grooming: Needs assistance Is this a change from baseline?: Change from baseline, expected to last <3 days Feeding: Independent Bathing: Needs assistance Is this a change from baseline?: Change from baseline, expected to last <3 days Toileting: Needs assistance Is this a change from baseline?: Change from baseline, expected to last <3 days In/Out Bed: Needs assistance Is this a change from baseline?: Change from baseline, expected to last <3 days Walks in Home: Needs assistance Is this a change from baseline?: Change from baseline, expected to last <3 days Does the patient have difficulty walking or climbing stairs?: Yes Weakness of Legs: Both Weakness of Arms/Hands: None  Permission Sought/Granted Permission sought to share information with : Case Manager, Family Supports, Customer service manager Permission granted to share information  with : Yes, Verbal Permission Granted  Share Information with NAME: Tiffany  Permission granted to share info w AGENCY: ALF  Permission granted to share info w Relationship: daughter  Permission granted to share info w Contact Information: Jonelle Sidle  [phone]  Emotional Assessment Appearance:: Appears stated age Attitude/Demeanor/Rapport: Gracious Affect (typically observed): Calm Orientation: : Oriented to Self, Oriented to Place, Oriented to  Time, Oriented to Situation (WDL) Alcohol / Substance Use: Not Applicable Psych Involvement: No (comment)  Admission diagnosis:  Hypoxia [R09.02] Acute CHF ([place]) [I50.9] Acute congestive heart failure, unspecified heart failure type [facility]) [I50.9] Patient Active Problem List   Diagnosis Date Noted   Essential hypertension 05/29/2021   Hypothyroid 05/29/2021   Dyslipidemia 05/29/2021   Pericardial effusion 05/29/2021   Acute CHF ([place]) 05/26/2021   Chronic kidney disease, stage IV (severe) ([place]) 11/24/2019   Hyperkalemia 11/24/2019   AAA (abdominal aortic aneurysm) without rupture ([place]) 11/11/2012   Fx lateral malleolus-closed 10/21/2011   CALCANEAL FRACTURE, LEFT 01/18/2010   PCP:  [name], MD Pharmacy:  No Pharmacies Listed    Social Determinants of Health (SDOH) Interventions    Readmission Risk Interventions No flowsheet data found.

## 2021-05-30 NOTE — Progress Notes (Signed)
  Echocardiogram 2D Echocardiogram has been performed.  Kurt Nelson M 05/30/2021, 11:13 AM

## 2021-05-30 NOTE — Progress Notes (Signed)
Progress Note  Patient Name: Kurt Nelson Date of Encounter: 05/30/2021  Primary Cardiologist:   Rozann Lesches, MD   Subjective   Feels OK.  No pain.  No SOB.    Inpatient Medications    Scheduled Meds:  amLODipine  10 mg Oral Daily   aspirin  81 mg Oral Daily   atorvastatin  40 mg Oral Daily   calcitRIOL  0.25 mcg Oral Once per day on Mon Wed Fri   cholecalciferol  1,000 Units Oral Daily   cloNIDine  0.2 mg Oral BID   dorzolamide-timolol  1 drop Left Eye BID   heparin  5,000 Units Subcutaneous Q8H   labetalol  600 mg Oral BID   latanoprost  1 drop Both Eyes QHS   levothyroxine  125 mcg Oral Q0600   losartan  12.5 mg Oral Daily   minoxidil  2.5 mg Oral BID   olopatadine  1 drop Both Eyes Daily   omega-3 acid ethyl esters  2 g Oral Daily   pantoprazole  40 mg Oral Daily   phenytoin  300 mg Oral QHS   sodium chloride flush  3 mL Intravenous Q12H   Continuous Infusions:  sodium chloride     PRN Meds: sodium chloride, acetaminophen **OR** acetaminophen, labetalol, ondansetron **OR** ondansetron (ZOFRAN) IV, sodium chloride flush   Vital Signs    Vitals:   05/29/21 1238 05/29/21 1515 05/29/21 2034 05/30/21 0346  BP: 117/86 (!) 153/95 124/84 109/71  Pulse: 68 80 82 75  Resp: '16 18 18 18  '$ Temp: 98.1 F (36.7 C) 97.8 F (36.6 C) 98.4 F (36.9 C) 98.8 F (37.1 C)  TempSrc: Oral Oral Oral Oral  SpO2: 97% 99%  94%  Weight:      Height:        Intake/Output Summary (Last 24 hours) at 05/30/2021 1204 Last data filed at 05/30/2021 1137 Gross per 24 hour  Intake 120 ml  Output 100 ml  Net 20 ml   Filed Weights   05/26/21 1847 05/27/21 0530 05/28/21 0500  Weight: 60.5 kg 58.3 kg 58.4 kg    Telemetry    NSR - Personally Reviewed  ECG    NA - Personally Reviewed  Physical Exam   GEN: No acute distress.   Neck: No  JVD Cardiac: RRR, no murmurs, rubs, or gallops.  Respiratory: Clear to auscultation bilaterally. GI: Soft, nontender, non-distended   MS: No  edema; No deformity. Neuro:  Nonfocal  Psych: Normal affect   Labs    Chemistry Recent Labs  Lab 05/26/21 1057 05/27/21 0505 05/28/21 0505 05/29/21 0419 05/30/21 0235  NA 139   < > 140 137 137  K 4.8   < > 4.4 4.4 4.4  CL 108   < > 108 105 107  CO2 24   < > '24 23 23  '$ GLUCOSE 103*   < > 89 97 96  BUN 27*   < > 34* 32* 32*  CREATININE 2.47*   < > 2.58* 2.75* 2.73*  CALCIUM 9.1   < > 8.9 9.0 8.9  PROT 7.7  --   --   --   --   ALBUMIN 3.8  --   --   --   --   AST 22  --   --   --   --   ALT 23  --   --   --   --   ALKPHOS 220*  --   --   --   --  BILITOT 0.3  --   --   --   --   GFRNONAA 28*   < > 27* 25* 25*  ANIONGAP 7   < > '8 9 7   '$ < > = values in this interval not displayed.     Hematology Recent Labs  Lab 05/28/21 0505 05/29/21 0419 05/30/21 0235  WBC 4.0 4.1 4.0  RBC 3.29* 3.45* 3.34*  HGB 9.5* 9.7* 9.6*  HCT 29.8* 30.1* 29.0*  MCV 90.6 87.2 86.8  MCH 28.9 28.1 28.7  MCHC 31.9 32.2 33.1  RDW 12.4 12.4 12.3  PLT 207 203 206    Cardiac EnzymesNo results for input(s): TROPONINI in the last 168 hours. No results for input(s): TROPIPOC in the last 168 hours.   BNP Recent Labs  Lab 05/26/21 1057  BNP 954.0*     DDimer No results for input(s): DDIMER in the last 168 hours.   Radiology    CARDIAC CATHETERIZATION  Result Date: 05/30/2021 Successful pericardiocentesis via the subxiphoid area with removal of 2150 mL of serosanguineous fluid.  There was resolution of pericardial effusion on echocardiogram which was being done at the same time.  The catheter was removed at the end of the case. Recommendations: Fluid was sent for analysis.  Continue supportive care.    Cardiac Studies   Echo 05/27/21: 1. Consider amyloidosis.   2. Left ventricular ejection fraction, by estimation, is 60 to 65%. The  left ventricle has normal function. The left ventricle has no regional  wall motion abnormalities. There is severe concentric left ventricular   hypertrophy. Left ventricular diastolic   parameters are consistent with Grade II diastolic dysfunction  (pseudonormalization).   3. Right ventricular systolic function is normal. The right ventricular  size is normal. There is normal pulmonary artery systolic pressure. The  estimated right ventricular systolic pressure is Q000111Q mmHg.   4. 5 cm pericardial effusion. Very large. Although there is no RV/ RA  collpase, pericardial tamponade remains a clinical bedside diagnosis.  Large pericardial effusion. The pericardial effusion is circumferential.   5. The mitral valve is normal in structure. No evidence of mitral valve  regurgitation. No evidence of mitral stenosis.   6. The aortic valve is normal in structure. Aortic valve regurgitation is  not visualized. No aortic stenosis is present.   7. The inferior vena cava is normal in size with greater than 50%  respiratory variability, suggesting right atrial pressure of 3 mmHg.   Patient Profile     65 y.o. male with a hx of AAA 3.1 cm, history of CVA, HTN, CKD stage 3b, HLD , tobacco abuse, prior cocaine use, who is being seen 05/28/2021 for the evaluation of pericardial effusion at the request of Dr. Manuella Ghazi.    Assessment & Plan    Pericardial effusion:  Status post pericardiocentesis.  Removed 2.1 liters.  Sent for culture and cytology.     LVH:  SPEP/UPEP pending.  PYP scan today.    HTN:  Continue current therapy.    BP is labile but for the most part well controlled.  No change in therapy.     CKD IV:  Creat is unchanged.  Follow.    For questions or updates, please contact [place] Please consult [email or web address] for contact info under Cardiology/STEMI.   Signed, Minus Breeding, MD  05/30/2021, 12:04 PM

## 2021-05-30 NOTE — Care Management Important Message (Signed)
Important Message  Patient Details  Name: DEMARQUS GOON MRN: [id number] Date of Birth: 1955/09/22   Medicare Important Message Given:  Yes     Shelda Altes 05/30/2021, 10:49 AM

## 2021-05-31 DIAGNOSIS — I313 Pericardial effusion (noninflammatory): Secondary | ICD-10-CM | POA: Diagnosis not present

## 2021-05-31 LAB — COMPREHENSIVE METABOLIC PANEL
ALT: 23 U/L (ref 0–44)
AST: 26 U/L (ref 15–41)
Albumin: 2.9 g/dL — ABNORMAL LOW (ref 3.5–5.0)
Alkaline Phosphatase: 175 U/L — ABNORMAL HIGH (ref 38–126)
Anion gap: 7 (ref 5–15)
BUN: 32 mg/dL — ABNORMAL HIGH (ref 8–23)
CO2: 23 mmol/L (ref 22–32)
Calcium: 8.9 mg/dL (ref 8.9–10.3)
Chloride: 108 mmol/L (ref 98–111)
Creatinine, Ser: 2.77 mg/dL — ABNORMAL HIGH (ref 0.61–1.24)
GFR, Estimated: 25 mL/min — ABNORMAL LOW (ref >60)
Glucose, Bld: 100 mg/dL — ABNORMAL HIGH (ref 70–99)
Potassium: 4.6 mmol/L (ref 3.5–5.1)
Sodium: 138 mmol/L (ref 135–145)
Total Bilirubin: 0.6 mg/dL (ref 0.3–1.2)
Total Protein: 6.2 g/dL — ABNORMAL LOW (ref 6.5–8.1)

## 2021-05-31 LAB — CBC
HCT: 32.7 % — ABNORMAL LOW (ref 39.0–52.0)
Hemoglobin: 10.7 g/dL — ABNORMAL LOW (ref 13.0–17.0)
MCH: 28.4 pg (ref 26.0–34.0)
MCHC: 32.7 g/dL (ref 30.0–36.0)
MCV: 86.7 fL (ref 80.0–100.0)
Platelets: 219 10*3/uL (ref 150–400)
RBC: 3.77 MIL/uL — ABNORMAL LOW (ref 4.22–5.81)
RDW: 12.3 % (ref 11.5–15.5)
WBC: 6.3 10*3/uL (ref 4.0–10.5)
nRBC: 0 % (ref 0.0–0.2)

## 2021-05-31 LAB — LD, BODY FLUID (OTHER): LD, Body Fluid: 94 IU/L

## 2021-05-31 LAB — PROTEIN ELECTROPHORESIS, SERUM
A/G Ratio: 1.1 (ref 0.7–1.7)
Albumin ELP: 3.2 g/dL (ref 2.9–4.4)
Alpha-1-Globulin: 0.2 g/dL (ref 0.0–0.4)
Alpha-2-Globulin: 0.5 g/dL (ref 0.4–1.0)
Beta Globulin: 1 g/dL (ref 0.7–1.3)
Gamma Globulin: 1.3 g/dL (ref 0.4–1.8)
Globulin, Total: 3 g/dL (ref 2.2–3.9)
Total Protein ELP: 6.2 g/dL (ref 6.0–8.5)

## 2021-05-31 LAB — PROTEIN, BODY FLUID (OTHER): Total Protein, Body Fluid Other: 4.9 g/dL

## 2021-05-31 LAB — GLUCOSE, BODY FLUID OTHER: Glucose, Body Fluid Other: 94 mg/dL

## 2021-05-31 MED ORDER — POLYETHYLENE GLYCOL 3350 17 G PO PACK
17.0000 g | PACK | Freq: Every day | ORAL | Status: DC
  Administered 2021-05-31 – 2021-06-01 (×2): 17 g via ORAL
  Filled 2021-05-31 (×2): qty 1

## 2021-05-31 NOTE — Progress Notes (Addendum)
Progress Note  Patient Name: Kurt Nelson Date of Encounter: 05/31/2021  [facility] Cardiologist: Rozann Lesches, MD   Subjective   No acute overnight events. Patient feeling much better after pericardiocentesis yesterday. Shortness of breath improved. No chest pain. He feels back to baseline and wants to go home.  Inpatient Medications    Scheduled Meds:  amLODipine  10 mg Oral Daily   aspirin  81 mg Oral Daily   atorvastatin  40 mg Oral Daily   calcitRIOL  0.25 mcg Oral Once per day on Mon Wed Fri   cholecalciferol  1,000 Units Oral Daily   dorzolamide-timolol  1 drop Left Eye BID   labetalol  200 mg Oral BID   latanoprost  1 drop Both Eyes QHS   levothyroxine  125 mcg Oral Q0600   olopatadine  1 drop Both Eyes Daily   omega-3 acid ethyl esters  2 g Oral Daily   pantoprazole  40 mg Oral Daily   phenytoin  300 mg Oral QHS   polyethylene glycol  17 g Oral Daily   sodium chloride flush  3 mL Intravenous Q12H   vitamin B-12  1,000 mcg Oral Daily   Continuous Infusions:  sodium chloride     PRN Meds: sodium chloride, acetaminophen **OR** acetaminophen, labetalol, ondansetron **OR** ondansetron (ZOFRAN) IV, sodium chloride flush   Vital Signs    Vitals:   05/30/21 1413 05/30/21 1954 05/31/21 0527 05/31/21 0800  BP: 117/66 113/74 129/82 113/74  Pulse: 83 76 77 76  Resp: '19 18 18 17  '$ Temp: 98.8 F (37.1 C) 98.8 F (37.1 C) 98.9 F (37.2 C) 98.7 F (37.1 C)  TempSrc: Oral Oral Oral Oral  SpO2: 98% 98% 98% 98%  Weight:   53.7 kg 53.7 kg  Height:        Intake/Output Summary (Last 24 hours) at 05/31/2021 0923 Last data filed at 05/31/2021 0532 Gross per 24 hour  Intake 360 ml  Output 625 ml  Net -265 ml   Last 3 Weights 05/31/2021 05/31/2021 05/28/2021  Weight (lbs) 118 lb 6.2 oz 118 lb 4.8 oz 128 lb 12 oz  Weight (kg) 53.7 kg 53.661 kg 58.4 kg      Telemetry    Normal sinus rhythm with rates in the 60s to 80s. - Personally Reviewed  ECG    No new  EKG tracing since 05/26/2021. - Personally Reviewed  Physical Exam   GEN: No acute distress.   Neck: No JVD. Cardiac: RRR. No murmurs, rubs, or gallops.  Respiratory: No increased work of breathing. Decreased breath sounds bilateral but otherwise clear to ausculation. No wheezes, rhonchi, or rales. GI: Soft, non-distended, and non-tender. MS: No lower extremity edema. No deformity. Skin: Warm and dry. Neuro:  No focal deficits. Psych: Normal affect. Responds appropriately.  Labs    High Sensitivity Troponin:   Recent Labs  Lab 05/26/21 1100 05/26/21 1510  TROPONINIHS 25* 27*     Chemistry Recent Labs  Lab 05/26/21 1057 05/27/21 0505 05/28/21 0505 05/29/21 0419 05/30/21 0235 05/31/21 0257  NA 139 141 140 137 137 138  K 4.8 4.4 4.4 4.4 4.4 4.6  CL 108 108 108 105 107 108  CO2 '24 26 24 23 23 23  '$ GLUCOSE 103* 84 89 97 96 100*  BUN 27* 34* 34* 32* 32* 32*  CREATININE 2.47* 2.59* 2.58* 2.75* 2.73* 2.77*  CALCIUM 9.1 8.8* 8.9 9.0 8.9 8.9  MG  --  1.9 2.0 1.8  --   --  PROT 7.7  --   --   --   --  6.2*  ALBUMIN 3.8  --   --   --   --  2.9*  AST 22  --   --   --   --  26  ALT 23  --   --   --   --  23  ALKPHOS 220*  --   --   --   --  175*  BILITOT 0.3  --   --   --   --  0.6  GFRNONAA 28* 27* 27* 25* 25* 25*  ANIONGAP '7 7 8 9 7 7    '$ Lipids No results for input(s): CHOL, TRIG, HDL, LABVLDL, LDLCALC, CHOLHDL in the last 168 hours.  Hematology Recent Labs  Lab 05/29/21 0419 05/30/21 0235 05/31/21 0257  WBC 4.1 4.0 6.3  RBC 3.45* 3.34* 3.77*  HGB 9.7* 9.6* 10.7*  HCT 30.1* 29.0* 32.7*  MCV 87.2 86.8 86.7  MCH 28.1 28.7 28.4  MCHC 32.2 33.1 32.7  RDW 12.4 12.3 12.3  PLT 203 206 219   Thyroid No results for input(s): TSH, FREET4 in the last 168 hours.  BNP Recent Labs  Lab 05/26/21 1057  BNP 954.0*    DDimer No results for input(s): DDIMER in the last 168 hours.   Radiology    CARDIAC CATHETERIZATION  Result Date: 05/30/2021 Successful  pericardiocentesis via the subxiphoid area with removal of 2150 mL of serosanguineous fluid.  There was resolution of pericardial effusion on echocardiogram which was being done at the same time.  The catheter was removed at the end of the case. Recommendations: Fluid was sent for analysis.  Continue supportive care.   ECHOCARDIOGRAM LIMITED  Result Date: 05/30/2021    ECHOCARDIOGRAM LIMITED REPORT   Patient Name:   Kurt Nelson Date of Exam: 05/30/2021 Medical Rec #:  [id number]    Height:       67.0 in Accession #:    [id number]   Weight:       128.7 lb Date of Birth:  01/02/1956   BSA:          1.677 m Patient Age:    65 years     BP:           109/71 mmHg Patient Gender: M            HR:           75 bpm. Exam Location:  Inpatient Procedure: Limited Echo Indications:    Pericardial effusion I31.3  History:        Patient has prior history of Echocardiogram examinations, most                 recent 05/27/2021. Risk Factors:Dyslipidemia, Hypertension and                 Current Smoker. AAA. Chronic kidney disease. Large pericardial                 effusion, Suspected amyloid.  Sonographer:    Darlina Sicilian RDCS Referring Phys: [place]  1. Limited study: pericardiocentesis. Large circumferential pericardial effusion with evidence of diastolic RV collapse. Post pericardiocentesis 2.1 L serosanguinous fluid only trival effusion remaining With improvement in RV volume. FINDINGS  Additional Comments: Limited study: pericardiocentesis. Large circumferential pericardial effusion with evidence of diastolic RV collapse. Post pericardiocentesis 2.1 L serosanguinous fluid only trival effusion remaining With improvement in RV volume. Jenkins Rouge MD Electronically signed  by Jenkins Rouge MD Signature Date/Time: 05/30/2021/12:54:08 PM    Final     Cardiac Studies   Complete Echo 05/27/2021: Impressions:  1. Consider amyloidosis.   2. Left ventricular ejection fraction, by estimation, is 60 to  65%. The  left ventricle has normal function. The left ventricle has no regional  wall motion abnormalities. There is severe concentric left ventricular  hypertrophy. Left ventricular diastolic   parameters are consistent with Grade II diastolic dysfunction  (pseudonormalization).   3. Right ventricular systolic function is normal. The right ventricular  size is normal. There is normal pulmonary artery systolic pressure. The  estimated right ventricular systolic pressure is Q000111Q mmHg.   4. 5 cm pericardial effusion. Very large. Although there is no RV/ RA  collpase, pericardial tamponade remains a clinical bedside diagnosis.  Large pericardial effusion. The pericardial effusion is circumferential.   5. The mitral valve is normal in structure. No evidence of mitral valve  regurgitation. No evidence of mitral stenosis.   6. The aortic valve is normal in structure. Aortic valve regurgitation is  not visualized. No aortic stenosis is present.   7. The inferior vena cava is normal in size with greater than 50%  respiratory variability, suggesting right atrial pressure of 3 mmHg.   Conclusion(s)/Recommendation(s): Findings consistent with hypertrophic  cardiomyopathy.  _______________  Pericardiocentesis 05/30/2021: Successful pericardiocentesis via the subxiphoid area with removal of 2150 mL of serosanguineous fluid.  There was resolution of pericardial effusion on echocardiogram which was being done at the same time.  The catheter was removed at the end of the case.   Recommendations: Fluid was sent for analysis.  Continue supportive care. _______________  Limited Echo 05/30/2021: Impressions: 1. Limited study: pericardiocentesis. Large circumferential pericardial  effusion with evidence of diastolic RV collapse. Post pericardiocentesis  2.1 L serosanguinous fluid only trival effusion remaining With improvement  in RV volume. _______________  PYP Scan 05/30/2021: Final read  pending.  Patient Profile     65 y.o. male with a history of 3.1 cm AAA, CVA, hypertension, hyperlipidemia, GERD, CKD stage III-V, tobacco abuse, and prior cocaine use who was admitted on 05/26/2021 after presenting with shortness of breath and worsening lower extremity edema. Found to have a large pericardial effusion for which Cardiology was consulted.  Assessment & Plan    Pericardial Effusion - Echo 9/25 showed a very large 5cm circumferential pericardial effusion. Findings consistent with hypertrophic cardiomyopathy with concern for amyloidosis. - S/p pericardiocentesis on 9/28 with removal of 2,150 of serosanguineous fluid with resolution of pericardial effusion on limited Echo following procedure. Fluid analysis pending. - Breathing better following pericardiocentesis. - He will need repeat limited Echo to make sure effusion has not re-accumulated. Will discuss timing of this with MD.  Acute Diastolic CHF Severe LVH - BNP elevated at 954.  - Chest x-ray showed signs of bilateral interstitial thickening suggesting interstitial edema. - Echo showed LVEF of 60-65% with normal wall motion, severe LVH, grade 2 diastolic dysfunction. Findings consistent with hypertrophic cardiomyopathy with concern for amyloidosis. - Initially diuresed with IV Lasix. Last dose 05/28/2021. Net negative 2.68 L since admission. - Appears euvolemic on exam.  - No additional Lasix needed at this time. - PYP scan was performed yesterday. Read pending. - SPEP/UPEP pending.  AAA - Most recent ultrasound in 12/2020 showed stable 3.1 cm AAA. - Followed by Vascular Surgery.  Hypertension - BP somewhat labile. Systolic BP as high as Q000111Q yesterday but I think this was around time of pericardiocentesis. Otherwise,  systolic BP ranging from 0000000 to 160s. BP well controlled this morning. - Continue current medications: Amlodipine '10mg'$  daily and Labetalol '200mg'$  twice daily. - Clonidine has been  stopped.  Hyperlipidemia - Continue Lipitor '40mg'$  daily and Lovaza 2g daily.  CKD Stage IV - Creatinine 2.77 today. Baseline around 2.4 to 2.8.  For questions or updates, please contact [place] Please consult [email or web address] for contact info under        Signed, Darreld Mclean, PA-C  05/31/2021, 9:23 AM    History and all data above reviewed.  Patient examined.  I agree with the findings as above.  Feels good and anxious to go home.  No pain.  No SOB.  The patient exam reveals COR:RRR  ,  Lungs: Clear  ,  Abd: Positive bowel sounds, no rebound no guarding, Ext No edema  CHEST:  No drainage from pericardiocentesis  .  All available labs, radiology testing, previous records reviewed. Agree with documented assessment and plan. Pericardial effusion:  Good result with pericardiocentesis.  I would suggest follow up echo limited in 4 - 6 weeks.  I have alerted Dr. Duard Brady to outstanding labs and scan results.  OK to go home from our standpoint.  We will arrange follow up.    Jeneen Rinks [facility]  10:46 AM  05/31/2021

## 2021-05-31 NOTE — Plan of Care (Signed)

## 2021-05-31 NOTE — Progress Notes (Signed)
PROGRESS NOTE    Kurt Nelson  [id number] DOB: 05-05-1956 DOA: 05/26/2021 PCP: [name], MD    Brief Narrative:  64/M with history of CVA, CKD 4, abdominal aortic aneurysm, hypertension, dyslipidemia presented to [facility] ED from his assisted living facility with lower extremity edema -Chest x-ray noted cardiomegaly,?  Pericardial effusion, 2D echocardiogram noted large pericardial effusion without tamponade physiology, subsequently transferred to [facility]  Assessment & Plan:   Large pericardial effusion  Acute on chronic diastolic heart failure  -Without tamponade physiology -2D echo with preserved EF, grade 2 diastolic dysfunction, ?  Amyloidosis -Cardiology consulted and following, underwent pericardiocentesis yesterday, 2.1 L of serosanguineous fluid drained -Follow-up immunofixation -Amyloid nuclear medicine scan completed last night, results pending -Hold heparin SQ -Discharge planning, back to ALF tomorrow  HTN (h/o difficult to control hypertension) Patient is preload dependent in the setting of large pericardial effusion. -BP stable at this time, losartan minoxidil and clonidine discontinued  -Continue amlodipine and labetalol   CKD stage IV -Creatinine 2.5-3 range which is his baseline -Discontinue oral bicarb, continue calcitriol and cholecalciferol.   Hypothyroid. Continue with levothyroxine   GERD continue with pantoprazole   AAA repair. Dyslipidemia. Continue with aspirin and statin therapy.    Hx of CVA/ seizures.  Continue statin and asa,  -Phenytoin  Cognitive deficits -Likely vascular following CVA, B12 low, continue replacement  DVT prophylaxis: Heparin  SQ-on hold Code Status:   full  Family Communication:  No family at the bedside     Status is: Inpatient  Remains inpatient appropriate because:IV treatments appropriate due to intensity of illness or inability to take PO  Dispo: The patient is from: ALF              Anticipated d/c is  to: ALF              Patient currently is not medically stable to d/c.   Difficult to place patient No   Consultants:  Cardiology   Subjective: -Feels okay today, sitting up eating breakfast  Objective: Vitals:   05/30/21 1954 05/31/21 0527 05/31/21 0800 05/31/21 1144  BP: 113/74 129/82 113/74 (!) 143/69  Pulse: 76 77 76   Resp: '18 18 17   '$ Temp: 98.8 F (37.1 C) 98.9 F (37.2 C) 98.7 F (37.1 C)   TempSrc: Oral Oral Oral   SpO2: 98% 98% 98%   Weight:  53.7 kg 53.7 kg   Height:        Intake/Output Summary (Last 24 hours) at 05/31/2021 1215 Last data filed at 05/31/2021 0532 Gross per 24 hour  Intake 360 ml  Output 525 ml  Net -165 ml   Filed Weights   05/28/21 0500 05/31/21 0527 05/31/21 0800  Weight: 58.4 kg 53.7 kg 53.7 kg    Examination:   General: Chronically ill male appears older than stated age, AAO x2, cognitive deficits noted HEENT: No JVD CVS: S1-S2, regular rhythm Lungs: Clear bilaterally Abdomen: Soft, nontender, bowel sounds present Extremities: No edema  Skin: No rash on exposed skin  Data Reviewed: I have personally reviewed following labs and imaging studies  CBC: Recent Labs  Lab 05/26/21 1057 05/27/21 0505 05/28/21 0505 05/29/21 0419 05/30/21 0235 05/31/21 0257  WBC 5.0 4.3 4.0 4.1 4.0 6.3  NEUTROABS 3.3  --   --   --  2.3  --   HGB 10.9* 9.8* 9.5* 9.7* 9.6* 10.7*  HCT 34.6* 31.1* 29.8* 30.1* 29.0* 32.7*  MCV 92.5 90.7 90.6 87.2 86.8 86.7  PLT 216 208 207 203 206 [id number]   Basic Metabolic Panel: Recent Labs  Lab 05/27/21 0505 05/28/21 0505 05/29/21 0419 05/30/21 0235 05/31/21 0257  NA 141 140 137 137 138  K 4.4 4.4 4.4 4.4 4.6  CL 108 108 105 107 108  CO2 '26 24 23 23 23  '$ GLUCOSE 84 89 97 96 100*  BUN 34* 34* 32* 32* 32*  CREATININE 2.59* 2.58* 2.75* 2.73* 2.77*  CALCIUM 8.8* 8.9 9.0 8.9 8.9  MG 1.9 2.0 1.8  --   --    GFR: Estimated Creatinine Clearance: 20.2 mL/min (A) (by C-G formula based on SCr of 2.77 mg/dL  (H)). Liver Function Tests: Recent Labs  Lab 05/26/21 1057 05/31/21 0257  AST 22 26  ALT 23 23  ALKPHOS 220* 175*  BILITOT 0.3 0.6  PROT 7.7 6.2*  ALBUMIN 3.8 2.9*   No results for input(s): LIPASE, AMYLASE in the last 168 hours. No results for input(s): AMMONIA in the last 168 hours. Coagulation Profile: No results for input(s): INR, PROTIME in the last 168 hours. Cardiac Enzymes: No results for input(s): CKTOTAL, CKMB, CKMBINDEX, TROPONINI in the last 168 hours. BNP (last 3 results) No results for input(s): PROBNP in the last 8760 hours. HbA1C: No results for input(s): HGBA1C in the last 72 hours. CBG: No results for input(s): GLUCAP in the last 168 hours. Lipid Profile: No results for input(s): CHOL, HDL, LDLCALC, TRIG, CHOLHDL, LDLDIRECT in the last 72 hours. Thyroid Function Tests: No results for input(s): TSH, T4TOTAL, FREET4, T3FREE, THYROIDAB in the last 72 hours. Anemia Panel: No results for input(s): VITAMINB12, FOLATE, FERRITIN, TIBC, IRON, RETICCTPCT in the last 72 hours.   Scheduled Meds:  amLODipine  10 mg Oral Daily   aspirin  81 mg Oral Daily   atorvastatin  40 mg Oral Daily   calcitRIOL  0.25 mcg Oral Once per day on Mon Wed Fri   cholecalciferol  1,000 Units Oral Daily   dorzolamide-timolol  1 drop Left Eye BID   labetalol  200 mg Oral BID   latanoprost  1 drop Both Eyes QHS   levothyroxine  125 mcg Oral Q0600   olopatadine  1 drop Both Eyes Daily   omega-3 acid ethyl esters  2 g Oral Daily   pantoprazole  40 mg Oral Daily   phenytoin  300 mg Oral QHS   polyethylene glycol  17 g Oral Daily   sodium chloride flush  3 mL Intravenous Q12H   vitamin B-12  1,000 mcg Oral Daily   Continuous Infusions:  sodium chloride       LOS: 5 days    Domenic Polite, MD

## 2021-06-01 ENCOUNTER — Other Ambulatory Visit (HOSPITAL_COMMUNITY): Payer: Self-pay

## 2021-06-01 ENCOUNTER — Other Ambulatory Visit: Payer: Self-pay | Admitting: Student

## 2021-06-01 DIAGNOSIS — I3139 Other pericardial effusion (noninflammatory): Secondary | ICD-10-CM

## 2021-06-01 LAB — RESP PANEL BY RT-PCR (FLU A&B, COVID) ARPGX2
Influenza A by PCR: NEGATIVE
Influenza B by PCR: NEGATIVE
SARS Coronavirus 2 by RT PCR: NEGATIVE

## 2021-06-01 LAB — CYTOLOGY - NON PAP

## 2021-06-01 MED ORDER — CHOLECALCIFEROL 25 MCG (1000 UT) PO TABS
1000.0000 [IU] | ORAL_TABLET | Freq: Every day | ORAL | 0 refills | Status: DC
  Filled 2021-06-01: qty 5, 5d supply, fill #0

## 2021-06-01 MED ORDER — SODIUM POLYSTYRENE SULFONATE 15 GM/60ML PO SUSP
60.0000 g | ORAL | 0 refills | Status: DC
  Filled 2021-06-01: qty 240, 1d supply, fill #0

## 2021-06-01 MED ORDER — CYANOCOBALAMIN 1000 MCG PO TABS: 1000.0000 ug | ORAL_TABLET | Freq: Every day | ORAL | Status: DC

## 2021-06-01 MED ORDER — CYANOCOBALAMIN 1000 MCG PO TABS
1000.0000 ug | ORAL_TABLET | Freq: Every day | ORAL | 0 refills | Status: DC
  Filled 2021-06-01: qty 4, 4d supply, fill #0

## 2021-06-01 MED ORDER — LABETALOL HCL 200 MG PO TABS: 200.0000 mg | ORAL_TABLET | Freq: Two times a day (BID) | ORAL | 0 refills | Status: DC

## 2021-06-01 MED ORDER — SODIUM POLYSTYRENE SULFONATE 15 GM/60ML PO SUSP
60.0000 g | ORAL | 0 refills | Status: DC
  Filled 2021-06-01: qty 60, fill #0

## 2021-06-01 NOTE — TOC Transition Note (Signed)
Transition of Care [facility]) - CM/SW Discharge Note   Patient Details  Name: Kurt Nelson MRN: [id number] Date of Birth: 12-02-1955  Transition of [facility] CM/SW Contact:  Milas Gain, [place] Phone Number: 06/01/2021, 12:00 PM   Clinical Narrative:     Patient will DC to: [facility]  Anticipated DC date: 06/01/2021  Family notified: Tiffany  Transport by: Safe Transport  ?  Per MD patient ready for DC to [facility]F with [facility] orders . RN, patient, patient's family, [facility] arranged,and facility notified of DC. Discharge [place] [place] orders, and Covid result sent to facility. RN given number for report [phone] RM# 3 on [facility] [place]. DC packet on chart. Safe transport requested for patient.  CSW signing off.   Final next level of care: [facility]) Barriers to Discharge: No Barriers Identified   Patient Goals and CMS Choice Patient states their goals for this hospitalization and ongoing recovery are:: to return to ALF with [facility] orders CMS Medicare.gov Compare Post Acute Care list provided to:: Patient Choice offered to / list presented to : Patient  Discharge Placement              Patient chooses bed at:  [facility]) Patient to be transferred to facility by: [facility] Name of family member notified: Tiffany Patient and family notified of of transfer: 06/01/21  Discharge Plan and Services In-house Referral: Clinical Social Work                                   Social Determinants of Health ([place]) Interventions     Readmission Risk Interventions No flowsheet data found.

## 2021-06-01 NOTE — TOC Progression Note (Addendum)
Transition of Care [facility]) - Progression Note    Patient Details  Name: Kurt Nelson MRN: [id number] Date of Birth: 11/01/1955  Transition of Care [facility]) CM/SW [place], [place] Phone Number: 06/01/2021, 11:12 AM  Clinical Narrative:     CSW spoke with [facility]F who confirmed they can accept patient back today. [name] was arranged by case manager for patient at ALF. CSW faxed over FL2, DC summary, [place] orders, and will fax over covid result. CSW will arrange safe transport for patient.   Expected Discharge Plan: Assisted Living Barriers to Discharge: Continued Medical Work up  Expected Discharge Plan and Services Expected Discharge Plan: Assisted Living In-house Referral: Clinical Social Work     Living arrangements for the past 2 months: [place] (From [facility]) Expected Discharge Date: 06/01/21                                     Social Determinants of Health (SDOH) Interventions    Readmission Risk Interventions No flowsheet data found.

## 2021-06-01 NOTE — Evaluation (Signed)
Occupational Therapy Evaluation and Discharge Patient Details Name: Kurt Nelson MRN: [id number] DOB: 1956-01-06 Today's Date: 06/01/2021   History of Present Illness Kurt Nelson is a 65 y.o. male with acute on chronic diastolic heart failure exacerbation in the setting of new large pericardial effusion. PMHx: AAA, hypertension, GERD, CKD stage IV, CVA, HTN, CKD, tobacco abuse, piror cocaine abuse. Brought to the ED from his ALF facility due to worsening lower extremity edema with mild shortness of breath over the last 2-3 days. 9/28 s/p pericardiocentesis.   Clinical Impression   This 65 yo male admitted and underwent above presents to acute OT with PLOF per his report of being ambulatory by himself with rollator and sometimes using W/C. In speaking to staff at ALF they confirm this--I let them know know that as of now we (OT/PT) are recommending pt only use is W/C (unsafe to use his rollator at this time--the lady on phone voiced understanding and asked if we were recommending HHOT/PT and I let her know that we are). Pt reports he that they do bath and dress him, but he does all of his own toileting. Pt currently is Mod A when up on his feet with RW. He is set to D/C today so we will D/C from acute OT.      Recommendations for follow up therapy are one component of a multi-disciplinary discharge planning process, led by the attending physician.  Recommendations may be updated based on patient status, additional functional criteria and insurance authorization.   Follow Up Recommendations  Home health OT;Supervision/Assistance - 24 hour (only use WC for now-no ambulation with rollator)    Equipment Recommendations  None recommended by OT       Precautions / Restrictions Precautions Precautions: Fall Restrictions Weight Bearing Restrictions: No      Mobility Bed Mobility Overal bed mobility: Needs Assistance Bed Mobility: Supine to Sit;Sit to Supine     Supine to sit: Min guard Sit  to supine: Min guard        Transfers Overall transfer level: Needs assistance Equipment used: Rolling walker (2 wheeled) Transfers: Sit to/from Stand Sit to Stand: Min assist         General transfer comment: ambulation Mod A (either for physical A or to help with manuvering RW safely)    Balance Overall balance assessment: Needs assistance Sitting-balance support: No upper extremity supported;Feet supported Sitting balance-Leahy Scale: Fair     Standing balance support: Bilateral upper extremity supported Standing balance-Leahy Scale: Poor                             ADL either performed or assessed with clinical judgement   ADL Overall ADL's : Needs assistance/impaired Eating/Feeding: Set up;Sitting   Grooming: Moderate assistance;Standing   Upper Body Bathing: Minimal assistance;Sitting   Lower Body Bathing: Moderate assistance Lower Body Bathing Details (indicate cue type and reason): for standing balance Upper Body Dressing : Moderate assistance;Sitting   Lower Body Dressing: Moderate assistance Lower Body Dressing Details (indicate cue type and reason): for standing balance Toilet Transfer: Moderate assistance;Ambulation;RW Toilet Transfer Details (indicate cue type and reason): simulated bed>out door and down hall>back to stand at sink to wash hands then to go to sit on bed Toileting- Clothing Manipulation and Hygiene: Maximal assistance Toileting - Clothing Manipulation Details (indicate cue type and reason): Mod A for standing balance             Vision  Ability to See in Adequate Light: 0 Adequate              Pertinent Vitals/Pain Pain Assessment: No/denies pain     Hand Dominance Right   Extremity/Trunk Assessment Upper Extremity Assessment Upper Extremity Assessment: RUE deficits/detail;LUE deficits/detail RUE Deficits / Details: ataxia, dysmetria; manages to feed self, uses straws LUE Deficits / Details: ataxia, dysmetria            Communication Communication Communication: No difficulties   Cognition Arousal/Alertness: Awake/alert Behavior During Therapy: Impulsive Overall Cognitive Status: History of cognitive impairments - at baseline                                                Home Living Family/patient expects to be discharged to:: Assisted living   Available Help at Discharge: Personal care attendant Type of Home: Assisted living Home Access: Level entry           Bathroom Shower/Tub: Walk-in Corporate treasurer Toilet: Handicapped height     Home Equipment: Environmental consultant - 4 wheels;Wheelchair - manual          Prior Functioning/Environment Level of Independence: Needs assistance  Gait / Transfers Assistance Needed: Pt normally ambulates with a rollator and also uses W/C some ADL's / Homemaking Assistance Needed: Per pt they bathe and dress him, but he does his own toileting            OT Problem List: Impaired balance (sitting and/or standing);Impaired vision/perception;Impaired tone;Decreased safety awareness;Impaired UE functional use         OT Goals(Current goals can be found in the care plan section) Acute Rehab OT Goals Patient Stated Goal: to go home today  OT Frequency:      AM-PAC OT "6 Clicks" Daily Activity     Outcome Measure Help from another person eating meals?: A Little (setup) Help from another person taking care of personal grooming?: A Lot Help from another person toileting, which includes using toliet, bedpan, or urinal?: A Lot Help from another person bathing (including washing, rinsing, drying)?: A Lot Help from another person to put on and taking off regular upper body clothing?: A Lot Help from another person to put on and taking off regular lower body clothing?: A Lot 6 Click Score: 13   End of Session Equipment Utilized During Treatment: Gait belt;Rolling walker Nurse Communication: Mobility status (NT, also emptied 175 ccs of  urine)  Activity Tolerance: Patient tolerated treatment well Patient left: in bed;with call bell/phone within reach;with bed alarm set  OT Visit Diagnosis: Unsteadiness on feet (R26.81);Other abnormalities of gait and mobility (R26.89);Muscle weakness (generalized) (M62.81);Ataxia, unspecified (R27.0);Other symptoms and signs involving cognitive function                Time: [id number] OT Time Calculation (min): 21 min Charges:  OT General Charges $OT Visit: 1 Visit OT Evaluation $OT Eval Moderate Complexity: 1 Mod  [place], OTR/L Acute [employer] Pager [phone] Office [phone]    Almon Register 06/01/2021, 10:27 AM

## 2021-06-01 NOTE — Progress Notes (Signed)
Ordered outpatient limited Echo to be done in 4-6 weeks for further evaluation of pericardial effusion. Please see Dr. Rosezella [place] rounding note from 05/31/2021 for more information.  Darreld Mclean, PA-C 06/01/2021 11:18 AM

## 2021-06-01 NOTE — Evaluation (Signed)
Physical Therapy Evaluation Patient Details Name: Kurt Nelson MRN: [id number] DOB: 20-May-1956 Today's Date: 06/01/2021  History of Present Illness  The pt is a 65 yo male presenting from ALF on 9/24 with c/o bilateral LE swelling and a fall. Admitted for acute on chronic diastolic heart failure exacerbation in the setting of new pericardial effusion. S/p pericardiocentesis on 9/28. PMH includes: AAA, anemia, ETOH and cocaine abuse, HTN, seizures, stroke, and hyperglycemia.   Clinical Impression  Pt in bed upon arrival of PT, agreeable to evaluation at this time. Prior to admission the pt was living at ALF where he receives assist for ADLs and ambulates with 4-wheel walker. The pt now presents with limitations in functional mobility, coordination, dynamic stability, and safety awareness due to above dx, and will continue to benefit from skilled PT to address these deficits. The pt was able to demo bed mobility and sit-stand transfer with little assist, but requires modA with use of RW to manage ambulation and standing balance tasks due to deficits in stability that are compounded by poor safety awareness. The pt will require assist with all OOB mobility and will benefit from skilled PT after d/c to reduce risk of falls with mobility.         Recommendations for follow up therapy are one component of a multi-disciplinary discharge planning process, led by the attending physician.  Recommendations may be updated based on patient status, additional functional criteria and insurance authorization.  Follow Up Recommendations Home health PT;Supervision for mobility/OOB (HHPT at ALF)    Equipment Recommendations  None recommended by PT (pt has needed DME)    Recommendations for Other Services       Precautions / Restrictions Precautions Precautions: Fall Precaution Comments: baseline tremors Restrictions Weight Bearing Restrictions: No      Mobility  Bed Mobility Overal bed mobility: Needs  Assistance Bed Mobility: Supine to Sit;Sit to Supine     Supine to sit: Min guard Sit to supine: Min guard   General bed mobility comments: pt transitioned to long-sitting then EOB without assist, very quick with movements. minG for safety    Transfers Overall transfer level: Needs assistance Equipment used: Rolling walker (2 wheeled) Transfers: Sit to/from Stand Sit to Stand: Min assist         General transfer comment: minA to power up, cues for hand placement for increased safety  Ambulation/Gait Ambulation/Gait assistance: Mod assist Gait Distance (Feet): 70 Feet Assistive device: Rolling walker (2 wheeled) Gait Pattern/deviations: Step-through pattern;Trunk flexed Gait velocity: impulsively quick, too quick for safety   General Gait Details: pt with wide BOS and LLE continually stepping to RW leg or outside of RW despite cues. pt maintains significant trunk flexion and RW outside BOS despite repeated cues. modA to steady pt and manage RW     Balance Overall balance assessment: Needs assistance;History of Falls Sitting-balance support: No upper extremity supported;Feet supported Sitting balance-Leahy Scale: Fair Sitting balance - Comments: able to lean outside BOS to feet Postural control: Posterior lean Standing balance support: Bilateral upper extremity supported Standing balance-Leahy Scale: Poor Standing balance comment: reliant on at least single UE, but falling posteriorly without modA                             Pertinent Vitals/Pain Pain Assessment: No/denies pain    Home Living Family/patient expects to be discharged to:: Assisted living   Available Help at Discharge: Personal care attendant Type of Home:  Assisted living Home Access: Level entry       Home Equipment: [place] - 4 wheels;Wheelchair - manual      Prior Function Level of Independence: Needs assistance   Gait / Transfers Assistance Needed: Pt normally ambulates with a  rollator and also uses W/C some  ADL's / Homemaking Assistance Needed: Per pt they bathe and dress him, but he does his own toileting        Hand Dominance   Dominant Hand: Right    Extremity/Trunk Assessment   Upper Extremity Assessment Upper Extremity Assessment: Defer to OT evaluation RUE Deficits / Details: ataxia, dysmetria; manages to feed self, uses straws LUE Deficits / Details: ataxia, dysmetria    Lower Extremity Assessment Lower Extremity Assessment: Overall WFL for tasks assessed (pt grossly 4/5 to MMT no instanes of buckling. pt reports sensation equal bilaterally)    Cervical / Trunk Assessment Cervical / Trunk Assessment: Kyphotic  Communication   Communication: No difficulties  Cognition Arousal/Alertness: Awake/alert Behavior During Therapy: Impulsive Overall Cognitive Status: History of cognitive impairments - at baseline                                 General Comments: impulsive with little insight to safety, unable to maintain corrections given for more than a few seconds      General Comments General comments (skin integrity, edema, etc.): pt with baseline tremors, worse in UE than LE     PT Assessment Patient needs continued PT services  PT Problem List Decreased strength;Decreased activity tolerance;Decreased range of motion;Decreased balance;Decreased mobility;Decreased coordination;Decreased knowledge of use of DME;Decreased safety awareness       PT Treatment Interventions DME instruction;Gait training;Functional mobility training;Stair training;Therapeutic activities;Therapeutic exercise;Balance training;Patient/family education    PT Goals (Current goals can be found in the Care Plan section)  Acute Rehab PT Goals Patient Stated Goal: to go home today PT Goal Formulation: With patient Time For Goal Achievement: 06/15/21 Potential to Achieve Goals: Good    Frequency Min 3X/week        Co-evaluation PT/OT/SLP  Co-Evaluation/Treatment: Yes Reason for Co-Treatment: For patient/therapist safety;To address functional/ADL transfers PT goals addressed during session: Mobility/safety with mobility;Balance OT goals addressed during session: ADL's and self-care       AM-PAC PT "6 Clicks" Mobility  Outcome Measure Help needed turning from your back to your side while in a flat bed without using bedrails?: None Help needed moving from lying on your back to sitting on the side of a flat bed without using bedrails?: None Help needed moving to and from a bed to a chair (including a wheelchair)?: A Little Help needed standing up from a chair using your arms (e.g., wheelchair or bedside chair)?: A Little Help needed to walk in hospital room?: A Lot Help needed climbing 3-5 steps with a railing? : A Lot 6 Click Score: 18    End of Session Equipment Utilized During Treatment: Gait belt Activity Tolerance: Patient tolerated treatment well Patient left: in bed;with call bell/phone within reach;with bed alarm set Nurse Communication: Mobility status PT Visit Diagnosis: Other abnormalities of gait and mobility (R26.89);Repeated falls (R29.6);Muscle weakness (generalized) (M62.81)    Time: [id number] PT Time Calculation (min) (ACUTE ONLY): 21 min   Charges:   PT Evaluation $PT Eval Moderate Complexity: 1 Mod          [place], PT, DPT   Acute Rehabilitation Department Pager #: [phone] -  [phone]  Sandra Cockayne 06/01/2021, 10:49 AM

## 2021-06-01 NOTE — NC FL2 (Addendum)
[place] LEVEL OF CARE SCREENING TOOL     IDENTIFICATION  Patient Name: Kurt Nelson Birthdate: 1956-05-31 Sex: male Admission Date (Current Location): 05/26/2021  [facility] and [place] Number:  Herbalist and Address:  [facility] [place]. [facility], [place] [address], [place], [place] [address]      Provider Number: [id number]  Attending Physician Name and Address:  Domenic Polite, MD  Relative Name and Phone Number:  Jonelle Sidle [phone]    Current Level of Care: Hospital Recommended Level of Care: [place] [facility]) Prior Approval Number:    Date Approved/Denied:   PASRR Number:    Discharge Plan: Other (Comment) [facility]F)    Current Diagnoses: Patient Active Problem List   Diagnosis Date Noted   Essential hypertension 05/29/2021   Hypothyroid 05/29/2021   Dyslipidemia 05/29/2021   Pericardial effusion 05/29/2021   Acute CHF ([place]) 05/26/2021   Chronic kidney disease, stage IV (severe) ([place]) 11/24/2019   Hyperkalemia 11/24/2019   AAA (abdominal aortic aneurysm) without rupture ([place]) 11/11/2012   Fx lateral malleolus-closed 10/21/2011   CALCANEAL FRACTURE, LEFT 01/18/2010    Orientation RESPIRATION BLADDER Height & Weight     Self, Time, Situation, Place (WDL)  Normal Incontinent Weight: 114 lb 10.2 oz (52 kg) Height:  '5\' 7"'$  (170.2 cm)  BEHAVIORAL SYMPTOMS/MOOD NEUROLOGICAL BOWEL NUTRITION STATUS      Continent (WDL) Diet (NATS and chopped meats)  AMBULATORY STATUS COMMUNICATION OF NEEDS Skin   supervison Verbally Other (Comment) (WDL)                       Personal Care Assistance Level of Assistance  Bathing, Feeding, Dressing Bathing Assistance: Limited assistance Feeding assistance: Independent (able to feed self) Dressing Assistance: Limited assistance     Functional Limitations Info  Sight, Hearing, Speech Sight Info: Impaired Hearing Info: Adequate Speech Info: Impaired  (difficulty speaking)    SPECIAL CARE FACTORS FREQUENCY  PT (By licensed PT), OT (By licensed OT)     PT Frequency: 3x min weekly OT Frequency: 3x min weekly            Contractures Contractures Info: Not present    Additional Factors Info  Code Status, Allergies Code Status Info: FULL Allergies Info: Penicillins           Current Medications (06/01/2021):  This is the current hospital active medication list Current Facility-Administered Medications  Medication Dose Route Frequency Provider Last Rate Last Admin   0.9 %  sodium chloride infusion  250 mL Intravenous PRN Wellington Hampshire, MD       acetaminophen (TYLENOL) tablet 650 mg  650 mg Oral Q6H PRN Wellington Hampshire, MD       Or   acetaminophen (TYLENOL) suppository 650 mg  650 mg Rectal Q6H PRN Wellington Hampshire, MD       amLODipine (NORVASC) tablet 10 mg  10 mg Oral Daily [name] A, MD   10 mg at 06/01/21 1003   aspirin chewable tablet 81 mg  81 mg Oral Daily [name] A, MD   81 mg at 06/01/21 1002   atorvastatin (LIPITOR) tablet 40 mg  40 mg Oral Daily [name] A, MD   40 mg at 06/01/21 1003   calcitRIOL (ROCALTROL) capsule 0.25 mcg  0.25 mcg Oral Once per day on Mon Wed Fri [name] A, MD   0.25 mcg at 06/01/21 1006   cholecalciferol (VITAMIN D3)  tablet 1,000 Units  1,000 Units Oral Daily [name] A, MD   1,000 Units at 06/01/21 1003   dorzolamide-timolol (COSOPT) 22.3-6.8 MG/ML ophthalmic solution 1 drop  1 drop Left Eye BID [name] A, MD   1 drop at 06/01/21 1008   labetalol (NORMODYNE) injection 10 mg  10 mg Intravenous Q2H PRN Wellington Hampshire, MD       labetalol (NORMODYNE) tablet 200 mg  200 mg Oral BID Domenic Polite, MD   200 mg at 06/01/21 1003   latanoprost (XALATAN) 0.005 % ophthalmic solution 1 drop  1 drop Both Eyes QHS [name] A, MD   1 drop at 05/31/21 2046   levothyroxine (SYNTHROID) tablet 125 mcg  125 mcg Oral Q0600 [name] A, MD   125 mcg  at 06/01/21 0658   olopatadine (PATANOL) 0.1 % ophthalmic solution 1 drop  1 drop Both Eyes Daily [name] A, MD   1 drop at 06/01/21 1008   omega-3 acid ethyl esters (LOVAZA) capsule 2 g  2 g Oral Daily [name] A, MD   2 g at 06/01/21 1002   ondansetron (ZOFRAN) tablet 4 mg  4 mg Oral Q6H PRN Wellington Hampshire, MD       Or   ondansetron (ZOFRAN) injection 4 mg  4 mg Intravenous Q6H PRN Wellington Hampshire, MD       pantoprazole (PROTONIX) EC tablet 40 mg  40 mg Oral Daily [name] A, MD   40 mg at 06/01/21 1003   phenytoin (DILANTIN) ER capsule 300 mg  300 mg Oral QHS [name] A, MD   300 mg at 05/31/21 2044   polyethylene glycol (MIRALAX / GLYCOLAX) packet 17 g  17 g Oral Daily Chotiner, Yevonne Aline, MD   17 g at 06/01/21 1002   sodium chloride flush (NS) 0.9 % injection 3 mL  3 mL Intravenous Q12H [name] A, MD   3 mL at 06/01/21 1003   sodium chloride flush (NS) 0.9 % injection 3 mL  3 mL Intravenous PRN Wellington Hampshire, MD       vitamin B-12 (CYANOCOBALAMIN) tablet 1,000 mcg  1,000 mcg Oral Daily Domenic Polite, MD   1,000 mcg at 06/01/21 1003     Discharge Medications: Please see discharge summary for a list of discharge medications.  Relevant Imaging Results:  Relevant Lab Results:   Additional Information [phone] Both Covid Vaccines and booster  Milas Gain, LCSWA

## 2021-06-01 NOTE — Discharge Summary (Addendum)
Physician Discharge Summary  Kurt Nelson [id number] DOB: [birth year] DOA: 05/26/2021  PCP: Kurt Gravel, MD  Admit date: 05/26/2021 Discharge date: 06/01/2021  Time spent: 52mnutes  Recommendations for Outpatient Follow-up:  Cardiology Dr. MDomenic Nelson 2 to 3 weeks, please follow-up UPEP, immunofixation, amyloidosis PYP scan results Home health PT OT Follow-up with nephrology for CKD 4  Discharge Diagnoses:  Principal Problem:    Large pericardial effusion   AAA (abdominal aortic aneurysm) without rupture (Kurt Nelson   Chronic kidney disease, stage IV (severe) (Kurt Nelson   Acute diastolic CHF (Kurt Nelson   Essential hypertension   Hypothyroid   Dyslipidemia   Discharge Condition: Stable  Diet recommendation: Low-sodium, heart healthy  Filed Weights   05/31/21 0527 05/31/21 0800 06/01/21 0500  Weight: 53.7 kg 53.7 kg 52 kg    History of present illness:  64/M with history of CVA, CKD 4, abdominal aortic aneurysm, hypertension, dyslipidemia [facility] with lower extremity edema -Chest x-ray noted cardiomegaly,?  Pericardial effusion, 2D echocardiogram noted large pericardial effusion without tamponade physiology, [facility]   Large pericardial effusion  Acute on chronic diastolic heart failure  -Without tamponade physiology -2D echo with preserved EF, grade 2 diastolic dysfunction, echo raised concern for possible amyloidosis -Cardiology consulted and following, underwent pericardiocentesis on 9/28, 2.1 L of serosanguineous fluid drained, follow-up cytology -Follow-up UPEP, immunofixation -Amyloid PYP nuclear medicine scan completed , results pending -Seen by Cardiology, cleared for DC with close Cards FU with Dr. MDomenic Nelson 2 weeks   HTN (h/o difficult to control hypertension) Patient is preload dependent in the setting of large pericardial effusion. -BP stable at this time, losartan minoxidil  and clonidine discontinued  -Continue amlodipine and labetalol    CKD stage IV -Creatinine 2.5-3 range which is his baseline -continue oral bicarb, continue calcitriol and cholecalciferol.    Hypothyroid. Continue with levothyroxine    GERD continue with pantoprazole    AAA repair. Dyslipidemia. Continue with aspirin and statin therapy.     Hx of CVA/ seizures.  Continue statin and asa,  -Phenytoin   Cognitive deficits -He has considerable cognitive deficits at baseline, suspect component of vascular dementia given history of  prior strokes, B12 levels also noted to be low, started on replacement  Procedures: Successful pericardiocentesis via the subxiphoid area with removal of 2150 mL of serosanguineous fluid.  There was resolution of pericardial effusion on echocardiogram which was being done at the same time.  The catheter was removed at the end of the case.   Recommendations: Fluid was sent for analysis.  Continue supportive care.  Discharge Exam: Vitals:   05/31/21 2044 06/01/21 0500  BP:  123/73  Pulse: 81 84  Resp:  18  Temp:  98.8 F (37.1 C)  SpO2:  93%    General: Awake, alert, oriented to self and place only, moderate cognitive deficits, poor insight and judgment Cardiovascular: S1-S2, regular rate rhythm Respiratory: Poor air movement bilaterally otherwise clear  Discharge Instructions   Discharge Instructions     Diet - low sodium heart healthy   Complete by: As directed    Increase activity slowly   Complete by: As directed       Allergies as of 06/01/2021       Reactions   Penicillins Itching, Rash        Medication List     STOP taking these medications    cloNIDine 0.2 MG tablet Commonly known as:  CATAPRES   Difluprednate 0.05 % Emul   ketoconazole 2 % shampoo Commonly known as: NIZORAL   lisinopril 5 MG tablet Commonly known as: ZESTRIL   losartan 25 MG tablet Commonly known as: COZAAR   minoxidil 2.5 MG tablet Commonly  known as: LONITEN   prednisoLONE acetate 1 % ophthalmic suspension Commonly known as: PRED FORTE       TAKE these medications    amLODipine 10 MG tablet Commonly known as: NORVASC Take 10 mg by mouth daily.   aspirin 81 MG tablet Take 81 mg by mouth daily.   atorvastatin 40 MG tablet Commonly known as: LIPITOR Take 40 mg by mouth daily.   calcitRIOL 0.25 MCG capsule Commonly known as: ROCALTROL Take 0.25 mcg by mouth See admin instructions. Take 1 capsule by mouth 3 times a weekly on Monday,Wednesday and Friday What changed: Another medication with the same name was removed. Continue taking this medication, and follow the directions you see here.   Cholecalciferol 25 MCG (1000 UT) capsule Take 1,000 Units by mouth daily.   cyanocobalamin 1000 MCG tablet Take 1 tablet (1,000 mcg total) by mouth daily. Start taking on: June 02, 2021   dorzolamide-timolol 22.3-6.8 MG/ML ophthalmic solution Commonly known as: COSOPT Place 1 drop into the left eye 2 (two) times daily.   fish oil-omega-3 fatty acids 1000 MG capsule Take 2 g by mouth daily.   furosemide 40 MG tablet Commonly known as: LASIX Take 40 mg by mouth See admin instructions. Take 1 tablet on Wednesday and Friday.   labetalol 200 MG tablet Commonly known as: NORMODYNE Take 1 tablet (200 mg total) by mouth 2 (two) times daily. What changed:  medication strength how much to take   levothyroxine 75 MCG tablet Commonly known as: SYNTHROID Take 125 mcg by mouth daily before breakfast. What changed: Another medication with the same name was removed. Continue taking this medication, and follow the directions you see here.   Lumigan 0.01 % Soln Generic drug: bimatoprost Place 1 drop into both eyes at bedtime.   olopatadine 0.1 % ophthalmic solution Commonly known as: PATANOL Place 1 drop into both eyes daily.   omeprazole 20 MG capsule Commonly known as: PRILOSEC Take 20 mg by mouth daily.   phenytoin  100 MG ER capsule Commonly known as: DILANTIN Take 300 mg by mouth at bedtime.   PRESCRIPTION MEDICATION Take 1 capsule by mouth daily. Vitamin D3 2000 i.u. Capsule   sodium bicarbonate 650 MG tablet Take 650 mg by mouth 2 (two) times daily.   sodium polystyrene 15 GM/60ML suspension Commonly known as: KAYEXALATE Take 60 g by mouth See admin instructions. Drink 60gm (264m) by mouth once a week. No specific day.       Allergies  Allergen Reactions   Penicillins Itching and Rash    Follow-up Information     QVerta Ellen, NP Follow up.   Specialty: Cardiology Why: Hospital follow-up with Cardiology scheduled for 06/11/2021 at 9:00am. Please arrive 15 minutes early for check-in. If this date/time does not work for you, please call our office to reschedule. Our office will also call you to scheduled repeat Echocardiogram in a few weeks. Contact information: [place] [phone]                 The results of significant diagnostics from this hospitalization (including imaging, microbiology, ancillary and laboratory) are listed below for reference.    Significant Diagnostic Studies:  CARDIAC CATHETERIZATION  Result Date: 05/30/2021 Successful pericardiocentesis via the subxiphoid area with removal of 2150 mL of serosanguineous fluid.  There was resolution of pericardial effusion on echocardiogram which was being done at the same time.  The catheter was removed at the end of the case. Recommendations: Fluid was sent for analysis.  Continue supportive care.   DG Chest Port 1 View  Result Date: 05/26/2021 CLINICAL DATA:  bilateral LE swelling x 2-3 days Denies SHOB. Reports a fall this morning around 2 am reports pain in left knee area. Pt able to get back up on his own and walk after fall EXAM: PORTABLE CHEST 1 VIEW COMPARISON:  09/06/2014. FINDINGS: Marked enlargement of the cardiopericardial silhouette, significantly increased  when compared to the prior exam. No mediastinal or hilar masses. Lungs are hyperexpanded with interstitial thickening, interstitial thickening increased compared to prior exams. No lung consolidation. Possible small effusions. No pneumothorax. Skeletal structures grossly intact. IMPRESSION: 1. Marked enlargement of the cardiopericardial silhouette, significantly increased compared to the prior studies. Silhouette configuration raises suspicion for a pericardial effusion. 2. Bilateral interstitial thickening increased from the prior studies suggesting interstitial edema. No evidence of pneumonia. Electronically Signed   By: Lajean Manes M.D.   On: 05/26/2021 11:30   ECHOCARDIOGRAM COMPLETE  Result Date: 05/27/2021    ECHOCARDIOGRAM REPORT   Patient Name:   Kurt Nelson Date of Exam: 05/27/2021 Medical Rec #:  [id number]    Height:       67.0 in Accession #:    [id number]   Weight:       128.5 lb Date of Birth:  1955-12-05   BSA:          1.676 m Patient Age:    31 years     BP:           145/83 mmHg Patient Gender: M            HR:           87 bpm. Exam Location:  [facility] Procedure: 2D Echo, Cardiac Doppler and Color Doppler STAT ECHO Indications:    CHF  History:        Patient has no prior history of Echocardiogram examinations.                 CHF, Signs/Symptoms:AAA and Shortness of Breath; Risk                 Factors:Hypertension and Dyslipidemia. Cocaine abuse, ETOH                 abuse, LE edema, CKD.  Sonographer:    Dustin Flock RDCS Referring Phys: [id number] Royanne Foots [place]  1. Consider amyloidosis.  2. Left ventricular ejection fraction, by estimation, is 60 to 65%. The left ventricle has normal function. The left ventricle has no regional wall motion abnormalities. There is severe concentric left ventricular hypertrophy. Left ventricular diastolic  parameters are consistent with Grade II diastolic dysfunction (pseudonormalization).  3. Right ventricular systolic function is normal.  The right ventricular size is normal. There is normal pulmonary artery systolic pressure. The estimated right ventricular systolic pressure is Q000111Q mmHg.  4. 5 cm pericardial effusion. Very large. Although there is no RV/ RA collpase, pericardial tamponade remains a clinical bedside diagnosis. Large pericardial effusion. The pericardial effusion is circumferential.  5. The mitral valve is normal in structure. No evidence of mitral valve regurgitation. No evidence of mitral stenosis.  6. The aortic valve is normal  in structure. Aortic valve regurgitation is not visualized. No aortic stenosis is present.  7. The inferior vena cava is normal in size with greater than 50% respiratory variability, suggesting right atrial pressure of 3 mmHg. Conclusion(s)/Recommendation(s): Findings consistent with hypertrophic cardiomyopathy. FINDINGS  Left Ventricle: Left ventricular ejection fraction, by estimation, is 60 to 65%. The left ventricle has normal function. The left ventricle has no regional wall motion abnormalities. The left ventricular internal cavity size was normal in size. There is  severe concentric left ventricular hypertrophy. Left ventricular diastolic parameters are consistent with Grade II diastolic dysfunction (pseudonormalization). Right Ventricle: The right ventricular size is normal. No increase in right ventricular wall thickness. Right ventricular systolic function is normal. There is normal pulmonary artery systolic pressure. The tricuspid regurgitant velocity is 2.76 m/s, and  with an assumed right atrial pressure of 3 mmHg, the estimated right ventricular systolic pressure is Q000111Q mmHg. Left Atrium: Left atrial size was normal in size. Right Atrium: Right atrial size was normal in size. Pericardium: 5 cm pericardial effusion. Very large. Although there is no RV/ RA collpase, pericardial tamponade remains a clinical bedside diagnosis. A large pericardial effusion is present. The pericardial effusion is  circumferential. Mitral Valve: The mitral valve is normal in structure. No evidence of mitral valve regurgitation. No evidence of mitral valve stenosis. Tricuspid Valve: The tricuspid valve is normal in structure. Tricuspid valve regurgitation is trivial. No evidence of tricuspid stenosis. Aortic Valve: The aortic valve is normal in structure. Aortic valve regurgitation is not visualized. No aortic stenosis is present. Pulmonic Valve: The pulmonic valve was normal in structure. Pulmonic valve regurgitation is not visualized. No evidence of pulmonic stenosis. Aorta: The aortic root is normal in size and structure. Venous: The inferior vena cava is normal in size with greater than 50% respiratory variability, suggesting right atrial pressure of 3 mmHg. IAS/Shunts: No atrial level shunt detected by color flow Doppler. Additional Comments: Consider amyloidosis.  LEFT VENTRICLE PLAX 2D LVIDd:         4.20 cm     Diastology LVIDs:         2.70 cm     LV e' medial:    4.20 cm/s LV PW:         1.50 cm     LV E/e' medial:  13.8 LV IVS:        1.40 cm     LV e' lateral:   4.51 cm/s LVOT diam:     2.10 cm     LV E/e' lateral: 12.9 LV SV:         69 LV SV Index:   41 LVOT Area:     3.46 cm  LV Volumes (MOD) LV vol d, MOD A4C: 70.8 ml LV vol s, MOD A4C: 20.0 ml LV SV MOD A4C:     70.8 ml RIGHT VENTRICLE RV Basal diam:  2.70 cm RV S prime:     11.50 cm/s TAPSE (M-mode): 2.2 cm LEFT ATRIUM             Index       RIGHT ATRIUM           Index LA diam:        4.10 cm 2.45 cm/m  RA Area:     11.30 cm LA Vol (A2C):   96.8 ml 57.76 ml/m RA Volume:   27.10 ml  16.17 ml/m LA Vol (A4C):   44.7 ml 26.67 ml/m LA Biplane Vol: 69.8 ml 41.65 ml/m  AORTIC VALVE LVOT Vmax:   127.00 cm/s LVOT Vmean:  76.900 cm/s LVOT VTI:    0.199 m  AORTA Ao Root diam: 2.80 cm MITRAL VALVE               TRICUSPID VALVE MV Area (PHT): 3.65 cm    TR Peak grad:   30.5 mmHg MV Decel Time: 208 msec    TR Vmax:        276.00 cm/s MV E velocity: 58.00 cm/s MV A  velocity: 45.40 cm/s  SHUNTS MV E/A ratio:  1.28        Systemic VTI:  0.20 m                            Systemic Diam: 2.10 cm Candee Furbish MD Electronically signed by Candee Furbish MD Signature Date/Time: 05/27/2021/11:35:46 AM    Final    ECHOCARDIOGRAM LIMITED  Result Date: 05/30/2021    ECHOCARDIOGRAM LIMITED REPORT   Patient Name:   CHADI MIDYETTE Date of Exam: 05/30/2021 Medical Rec #:  [id number]    Height:       67.0 in Accession #:    [id number]   Weight:       128.7 lb Date of Birth:  01/27/1956   BSA:          1.677 m Patient Age:    27 years     BP:           109/71 mmHg Patient Gender: M            HR:           75 bpm. Exam Location:  Inpatient Procedure: Limited Echo Indications:    Pericardial effusion I31.3  History:        Patient has prior history of Echocardiogram examinations, most                 recent 05/27/2021. Risk Factors:Dyslipidemia, Hypertension and                 Current Smoker. AAA. Chronic kidney disease. Large pericardial                 effusion, Suspected amyloid.  Sonographer:    Darlina Sicilian RDCS Referring Phys: [place]  1. Limited study: pericardiocentesis. Large circumferential pericardial effusion with evidence of diastolic RV collapse. Post pericardiocentesis 2.1 L serosanguinous fluid only trival effusion remaining With improvement in RV volume. FINDINGS  Additional Comments: Limited study: pericardiocentesis. Large circumferential pericardial effusion with evidence of diastolic RV collapse. Post pericardiocentesis 2.1 L serosanguinous fluid only trival effusion remaining With improvement in RV volume. Jenkins Rouge MD Electronically signed by Jenkins Rouge MD Signature Date/Time: 05/30/2021/12:54:08 PM    Final     Microbiology: Recent Results (from the past 240 hour(s))  Resp Panel by RT-PCR (Flu A&B, Covid) Nasopharyngeal Swab     Status: None   Collection Time: 05/26/21 11:16 AM   Specimen: Nasopharyngeal Swab; Nasopharyngeal(NP) swabs in  vial transport medium  Result Value Ref Range Status   SARS Coronavirus 2 by RT PCR NEGATIVE NEGATIVE Final    Comment: (NOTE) SARS-CoV-2 target nucleic acids are NOT DETECTED.  The SARS-CoV-2 RNA is generally detectable in upper respiratory specimens during the acute phase of infection. The lowest concentration of SARS-CoV-2 viral copies this assay can detect is 138 copies/mL. A negative result does not preclude SARS-Cov-2 infection and should not be used as  the sole basis for treatment or other patient management decisions. A negative result may occur with  improper specimen collection/handling, submission of specimen other than nasopharyngeal swab, presence of viral mutation(s) within the areas targeted by this assay, and inadequate number of viral copies(<138 copies/mL). A negative result must be combined with clinical observations, patient history, and epidemiological information. The expected result is Negative.  Fact Sheet for Patients:  EntrepreneurPulse.com.au  Fact Sheet for Healthcare Providers:  IncredibleEmployment.be  This test is no t yet approved or cleared by the [place] FDA and  has been authorized for detection and/or diagnosis of SARS-CoV-2 by FDA under an Emergency Use Authorization (EUA). This EUA will remain  in effect (meaning this test can be used) for the duration of the COVID-19 declaration under Section 564(b)(1) of the Act, 21 U.S.C.section 360bbb-3(b)(1), unless the authorization is terminated  or revoked sooner.       Influenza A by PCR NEGATIVE NEGATIVE Final   Influenza B by PCR NEGATIVE NEGATIVE Final    Comment: (NOTE) The Xpert Xpress SARS-CoV-2/FLU/RSV plus assay is intended as an aid in the diagnosis of influenza from Nasopharyngeal swab specimens and should not be used as a sole basis for treatment. Nasal washings and aspirates are unacceptable for Xpert Xpress SARS-CoV-2/FLU/RSV testing.  Fact  Sheet for Patients: EntrepreneurPulse.com.au  Fact Sheet for Healthcare Providers: IncredibleEmployment.be  This test is not yet approved or cleared by the [place] FDA and has been authorized for detection and/or diagnosis of SARS-CoV-2 by FDA under an Emergency Use Authorization (EUA). This EUA will remain in effect (meaning this test can be used) for the duration of the COVID-19 declaration under Section 564(b)(1) of the Act, 21 U.S.C. section 360bbb-3(b)(1), unless the authorization is terminated or revoked.  Performed at [facility], [address], [place], [place] [address]   Body fluid culture w Gram Stain     Status: None (Preliminary result)   Collection Time: 05/30/21 10:37 AM   Specimen: PATH Cytology Misc. fluid; Body Fluid  Result Value Ref Range Status   Specimen Description FLUID  Final   Special Requests NONE  Final   Gram Stain   Final    RARE WBC PRESENT,BOTH PMN AND MONONUCLEAR NO ORGANISMS SEEN    Culture   Final    NO GROWTH 2 DAYS Performed at [facility], [address], [place], [place] [address]    Report Status PENDING  Incomplete     Labs: Basic Metabolic Panel: Recent Labs  Lab 05/27/21 0505 05/28/21 0505 05/29/21 0419 05/30/21 0235 05/31/21 0257  NA 141 140 137 137 138  K 4.4 4.4 4.4 4.4 4.6  CL 108 108 105 107 108  CO2 '26 24 23 23 23  '$ GLUCOSE 84 89 97 96 100*  BUN 34* 34* 32* 32* 32*  CREATININE 2.59* 2.58* 2.75* 2.73* 2.77*  CALCIUM 8.8* 8.9 9.0 8.9 8.9  MG 1.9 2.0 1.8  --   --    Liver Function Tests: Recent Labs  Lab 05/26/21 1057 05/31/21 0257  AST 22 26  ALT 23 23  ALKPHOS 220* 175*  BILITOT 0.3 0.6  PROT 7.7 6.2*  ALBUMIN 3.8 2.9*   No results for input(s): LIPASE, AMYLASE in the last 168 hours. No results for input(s): AMMONIA in the last 168 hours. CBC: Recent Labs  Lab 05/26/21 1057 05/27/21 0505 05/28/21 0505 05/29/21 0419 05/30/21 0235 05/31/21 0257  WBC  5.0 4.3 4.0 4.1 4.0 6.3  NEUTROABS 3.3  --   --   --  2.3  --   HGB 10.9* 9.8* 9.5* 9.7* 9.6* 10.7*  HCT 34.6* 31.1* 29.8* 30.1* 29.0* 32.7*  MCV 92.5 90.7 90.6 87.2 86.8 86.7  PLT 216 208 207 203 206 219   Cardiac Enzymes: No results for input(s): CKTOTAL, CKMB, CKMBINDEX, TROPONINI in the last 168 hours. BNP: BNP (last 3 results) Recent Labs    05/26/21 1057  BNP 954.0*    ProBNP (last 3 results) No results for input(s): PROBNP in the last 8760 hours.  CBG: No results for input(s): GLUCAP in the last 168 hours.     Signed:  Domenic Polite MD.  [facility] 06/01/2021, 10:15 AM

## 2021-06-01 NOTE — Progress Notes (Signed)
Attempted to call report x 2 to [facility].

## 2021-06-02 LAB — BODY FLUID CULTURE W GRAM STAIN: Culture: NO GROWTH

## 2021-06-04 LAB — UPEP/UIFE/LIGHT CHAINS/TP, 24-HR UR
% BETA, Urine: 10.4 %
ALPHA 1 URINE: 6 %
Albumin, U: 72.5 %
Alpha 2, Urine: 4.4 %
Free Kappa Lt Chains,Ur: 122.88 mg/L — ABNORMAL HIGH (ref 1.17–86.46)
Free Kappa/Lambda Ratio: 8.18 (ref 1.83–14.26)
Free Lambda Lt Chains,Ur: 15.03 mg/L (ref 0.27–15.21)
GAMMA GLOBULIN URINE: 6.7 %
Total Protein, Urine-Ur/day: 653 mg/24 hr — ABNORMAL HIGH (ref 30–150)
Total Protein, Urine: 65.3 mg/dL
Total Volume: 1000

## 2021-06-10 NOTE — Progress Notes (Signed)
Cardiology Office Note  Date: 06/11/2021   ID: NGOC MALLA, DOB Jan 25, 1956, MRN [id number]  PCP:  [name], MD  Cardiologist:  Rozann Lesches, MD Electrophysiologist:  None   Chief Complaint: Hospital follow-up  History of Present Illness: Kurt Nelson is a 65 y.o. male with a history of AAA, anemia, CKD, acute CHF, HTN, pericardial effusion, EtOH abuse, cocaine abuse, GERD, hyperglycemia, ICH, seizures, CVA.  Recent hospitalization 05/26/2021.  He presented to [facility], ED with lower extremity edema.  Chest x-ray noted cardiomegaly questionable pericardial effusion, 2D echo cardiogram noted large pericardial effusion without tamponade.  He was subsequently transferred to [facility].  Echocardiogram with preserved EF, grade 2 DD.  Echo raised concern for possible amyloidosis.  He underwent pericardiocentesis on 05/30/2021 with 2.1 L of fluid removed.  Fluid was sent for cytology, follow-up UPEP and immunofixation.  Blood pressure was stable.  His losartan, minoxidil and clonidine were discontinued.  He was continuing amlodipine and labetalol.  His creatinine was 2.5-3 range which was his baseline for CKD stage IV.  He was continuing aspirin and statin therapy.  Continuing phenytoin for history of CVA/seizures.  He is here for follow-up after recent hospital stay with large pericardial effusion status post pericardiocentesis with removal of 2150 cc of fluid.  Amyloid scan was negative for ATTR but there were issues with inadequate labeling.  Patient refused a follow-up scan.  He denies any lower extremity edema today.  Denies any DOE or SOB.  Denies any lightheadedness or dizziness, presyncope or syncopal episodes.  Denies any PND, orthopnea.  He has an upcoming follow-up echocardiogram on November 7 for follow-up on pericardial effusion.     Past Medical History:  Diagnosis Date   AAA (abdominal aortic aneurysm)    Anemia, normocytic normochromic    CKD (chronic kidney  disease) stage 3, GFR 30-59 ml/min (HCC)    Cocaine abuse (HCC)    ETOH abuse    GERD (gastroesophageal reflux disease)    Hyperglycemia    Hypertension    ICH (intracerebral hemorrhage) ([place])    Seizures ([place])    Stroke [facility])     Past Surgical History:  Procedure Laterality Date   PERICARDIOCENTESIS N/A 05/30/2021   Procedure: PERICARDIOCENTESIS;  Surgeon: Wellington Hampshire, MD;  Location: [place] [facility];  Service: Cardiovascular;  Laterality: N/A;    Current Outpatient Medications  Medication Sig Dispense Refill   amLODipine (NORVASC) 10 MG tablet Take 10 mg by mouth daily.     aspirin 81 MG tablet Take 81 mg by mouth daily.     atorvastatin (LIPITOR) 40 MG tablet Take 40 mg by mouth daily.     calcitRIOL (ROCALTROL) 0.25 MCG capsule Take 0.25 mcg by mouth See admin instructions. Take 1 capsule by mouth 3 times a weekly on Monday,Wednesday and Friday     Cholecalciferol 25 MCG (1000 UT) tablet Take 1 tablet by mouth daily. 5 tablet 0   cyanocobalamin 1000 MCG tablet Take 1 tablet (1,000 mcg total) by mouth daily. 4 tablet 0   dorzolamide-timolol (COSOPT) 22.3-6.8 MG/ML ophthalmic solution Place 1 drop into the left eye 2 (two) times daily.     fish oil-omega-3 fatty acids 1000 MG capsule Take 2 g by mouth daily.     furosemide (LASIX) 40 MG tablet Take 40 mg by mouth See admin instructions. Take 1 tablet on Wednesday and Friday.     labetalol (NORMODYNE) 200 MG tablet Take 1 tablet (200 mg total)  by mouth 2 (two) times daily. 60 tablet 0   levothyroxine (SYNTHROID, LEVOTHROID) 75 MCG tablet Take 125 mcg by mouth daily before breakfast.     LUMIGAN 0.01 % SOLN Place 1 drop into both eyes at bedtime.     olopatadine (PATANOL) 0.1 % ophthalmic solution Place 1 drop into both eyes daily.     omeprazole (PRILOSEC) 20 MG capsule Take 20 mg by mouth daily.     phenytoin (DILANTIN) 100 MG ER capsule Take 300 mg by mouth at bedtime.      PRESCRIPTION MEDICATION Take 1 capsule by mouth  daily. Vitamin D3 2000 i.u. Capsule     sodium bicarbonate 650 MG tablet Take 650 mg by mouth 2 (two) times daily.     sodium polystyrene (KAYEXALATE) 15 GM/60ML suspension Take 240 mLs (60 g total) by mouth See admin instructions. Drink 60gm (231m) by mouth once a week. No specific day. (Patient not taking: Reported on 06/11/2021) 240 mL 0   No current facility-administered medications for this visit.   Allergies:  Penicillins   Social History: The patient  reports that he has been smoking. He has been smoking an average of .25 packs per day. He has never used smokeless tobacco. He reports that he does not drink alcohol and does not use drugs.   Family History: The patient's family history includes Hypertension in his mother, sister, and sister.   ROS:  Please see the history of present illness. Otherwise, complete review of systems is positive for none.  All other systems are reviewed and negative.   Physical Exam: VS:  BP 130/80   Pulse 74   Ht '5\' 7"'$  (1.702 m)   Wt 126 lb (57.2 kg)   SpO2 100%   BMI 19.73 kg/m , BMI Body mass index is 19.73 kg/m.  Wt Readings from Last 3 Encounters:  06/11/21 126 lb (57.2 kg)  06/01/21 114 lb 10.2 oz (52 kg)  01/17/21 133 lb 11.2 oz (60.6 kg)    General: Patient appears comfortable at rest. Neck: Supple, no elevated JVP or carotid bruits, no thyromegaly. Lungs: Clear to auscultation, nonlabored breathing at rest. Cardiac: Regular rate and rhythm, no S3 or significant systolic murmur, no pericardial rub. Extremities: No pitting edema, distal pulses 2+. Skin: Warm and dry. Musculoskeletal: No kyphosis. Neuropsychiatric: Alert and oriented x3, affect grossly appropriate.  ECG:    Recent Labwork: 05/26/2021: B Natriuretic Peptide 954.0 05/29/2021: Magnesium 1.8 05/31/2021: ALT 23; AST 26; BUN 32; Creatinine, Ser 2.77; Hemoglobin 10.7; Platelets 219; Potassium 4.6; Sodium 138  No results found for: CHOL, TRIG, HDL, CHOLHDL, VLDL, LDLCALC,  LDLDIRECT  Other Studies Reviewed Today:  NM scan Cardiac Amyloid 05/30/2021 IMPRESSION: Visual and quantitative assessment (grade 1, H/CLL equal 1.2) are NOT suggestive of transthyretin amyloidosis.   Of note the validity of the exam is in question as there is excessive free pertechnetate which would indicate poor pyrophosphate labeling. Patient was contacted to repeat exam. Patient refused to repeat exam.  Echocardiogram limited 05/30/2021 Limited study: pericardiocentesis. Large circumferential pericardial effusion with evidence of diastolic RV collapse. Post pericardiocentesis 2.1 L serosanguinous fluid only trival effusion remaining With improvement in RV volume.  Assessment and Plan:  1. Pericardial effusion   2. Acute on chronic diastolic heart failure ([place]   3. Chronic kidney disease, stage IV (severe) ([place]   4. Essential hypertension    1. Pericardial effusion Recent admission with large pericardial effusion.  He had a pericardiocentesis with 2150 cc of fluid removed.  Fluid was sent for cytology and cell counts.  He had an echocardiogram suspicious for cardiac amyloidosis.  Cardiac nuclear amyloid scan was negative for evidence of transthyretin amyloidosis.  Apparently there was inadequate labeling on scan and follow-up scan was suggested.  Patient refused follow-up scan and continues to do so.  He has a follow-up echocardiogram on November 7 to recheck pericardial effusion.  2. Acute on chronic diastolic heart failure [facility]) Patient is euvolemic today.  Denies any weight gain, shortness of breath, PND, orthopnea, lower extremity edema.  Continue Lasix 40 mg on Wednesday and Friday  3. Chronic kidney disease, stage IV (severe) (HCC) Creatinine on 05/30/2021 was 2.73 and GFR 25.  Baseline creatinine is between 2.5 and 3.  4. Essential hypertension Blood pressure was initially 98/68 on arrival.  Recheck in right arm 130/80.  Continue labetalol 200 mg p.o. twice daily.   Continue Lasix 40 mg on Wednesday and Friday.  Continue amlodipine 10 mg daily.  5.  Hyperlipidemia Continue atorvastatin 40 mg daily.  Continue aspirin 81 mg daily.  Medication Adjustments/Labs and Tests Ordered: Current medicines are reviewed at length with the patient today.  Concerns regarding medicines are outlined above.   Disposition: Follow-up with Dr. Domenic Polite or APP 6 months  Signed, Levell July, NP 06/11/2021 9:25 AM    [facility] at [place], [place], [place] [address] Phone: [phone]; Fax: [phone]

## 2021-06-11 ENCOUNTER — Encounter: Payer: Self-pay | Admitting: Family Medicine

## 2021-06-11 ENCOUNTER — Ambulatory Visit (INDEPENDENT_AMBULATORY_CARE_PROVIDER_SITE_OTHER): Payer: Medicare Other | Admitting: Family Medicine

## 2021-06-11 VITALS — BP 130/80 | HR 74 | Ht 67.0 in | Wt 126.0 lb

## 2021-06-11 DIAGNOSIS — I3139 Other pericardial effusion (noninflammatory): Secondary | ICD-10-CM

## 2021-06-11 DIAGNOSIS — N184 Chronic kidney disease, stage 4 (severe): Secondary | ICD-10-CM

## 2021-06-11 DIAGNOSIS — I5033 Acute on chronic diastolic (congestive) heart failure: Secondary | ICD-10-CM | POA: Diagnosis not present

## 2021-06-11 DIAGNOSIS — I10 Essential (primary) hypertension: Secondary | ICD-10-CM | POA: Diagnosis not present

## 2021-06-11 NOTE — Patient Instructions (Signed)
Medication Instructions:  Your physician recommends that you continue on your current medications as directed. Please refer to the Current Medication list given to you today.  *If you need a refill on your cardiac medications before your next appointment, please call your pharmacy*   Lab Work: None If you have labs (blood work) drawn today and your tests are completely normal, you will receive your results only by: [place] (if you have MyChart) OR A paper copy in the mail If you have any lab test that is abnormal or we need to change your treatment, we will call you to review the results.   Testing/Procedures: None   Follow-Up: At [facility], you and your health needs are our priority.  As part of our continuing mission to provide you with exceptional heart care, we have created designated Provider Care Teams.  These Care Teams include your primary Cardiologist (physician) and Advanced Practice Providers (APPs -  Physician Assistants and Nurse Practitioners) who all work together to provide you with the care you need, when you need it.  We recommend signing up for the patient portal called "MyChart".  Sign up information is provided on this After Visit Summary.  MyChart is used to connect with patients for Virtual Visits (Telemedicine).  Patients are able to view lab/test results, encounter notes, upcoming appointments, etc.  Non-urgent messages can be sent to your provider as well.   To learn more about what you can do with MyChart, go to [email or web address].    Your next appointment:   6 month(s)  The format for your next appointment:   In Person  Provider:   Katina Dung, NP   Other Instructions

## 2021-07-09 ENCOUNTER — Ambulatory Visit (HOSPITAL_COMMUNITY)
Admission: RE | Admit: 2021-07-09 | Discharge: 2021-07-09 | Disposition: A | Discharge status: Home / Self Care | Payer: Medicare Other | Source: Ambulatory Visit | Attending: Internal Medicine | Admitting: Internal Medicine

## 2021-07-09 DIAGNOSIS — I3139 Other pericardial effusion (noninflammatory): Secondary | ICD-10-CM | POA: Insufficient documentation

## 2021-07-09 NOTE — Progress Notes (Incomplete)
*  PRELIMINARY RESULTS* Echocardiogram 2D Echocardiogram has been performed.  Kurt Nelson 07/09/2021, 2:28 PM

## 2021-07-10 ENCOUNTER — Encounter: Payer: Self-pay | Admitting: Family Medicine

## 2021-07-10 ENCOUNTER — Ambulatory Visit (INDEPENDENT_AMBULATORY_CARE_PROVIDER_SITE_OTHER): Payer: Medicare Other | Admitting: Family Medicine

## 2021-07-10 ENCOUNTER — Telehealth: Payer: Self-pay

## 2021-07-10 VITALS — BP 138/88 | HR 66 | Wt 127.8 lb

## 2021-07-10 DIAGNOSIS — I5033 Acute on chronic diastolic (congestive) heart failure: Secondary | ICD-10-CM

## 2021-07-10 DIAGNOSIS — I10 Essential (primary) hypertension: Secondary | ICD-10-CM | POA: Diagnosis not present

## 2021-07-10 DIAGNOSIS — I3139 Other pericardial effusion (noninflammatory): Secondary | ICD-10-CM | POA: Diagnosis not present

## 2021-07-10 DIAGNOSIS — N184 Chronic kidney disease, stage 4 (severe): Secondary | ICD-10-CM

## 2021-07-10 DIAGNOSIS — E782 Mixed hyperlipidemia: Secondary | ICD-10-CM

## 2021-07-10 NOTE — Telephone Encounter (Signed)
-----   Message from Satira Sark, MD sent at 07/09/2021  4:52 PM EST ----- I reviewed the chart.  This echocardiogram was ordered in follow-up of pericardiocentesis for a very large pericardial effusion.  Patient was seen in the interim by Mr. Leonides Sake NP.  There was question of amyloidosis on his initial echocardiogram, PYP scan was of suboptimal quality but not diagnostic for ATTR.  It looks like SPEP and UPEP were also nondiagnostic for monoclonal spike.  No malignant cells on his pericardial fluid analysis.  Patient lives in a nursing home and he did not want to follow-up for repeat imaging to evaluate for cardiac amyloidosis.  Please get him to follow-up for further discussion if he is agreeable.  The big question here is whether he would be a candidate for and agreed to a pericardial window since he had fairly rapid reaccumulation of pericardial effusion fluid.  I would not pursue pericardiocentesis alone unless he was in clinical tamponade and needed the procedure urgently. ----- Message ----- From: Darreld Mclean, PA-C Sent: 07/09/2021   3:55 PM EST To: Satira Sark, MD, Waylan Rocher, LPN, #  Please call patient with results: Patient's pericardial effusion (fluid around his heart) has returned and is large again. He needs an appointment with Dr. Domenic Polite or APP ASAP to discuss next steps - suspect he is going to need another pericardiocentesis to drain the fluid. If he develops any symptoms of chest pain, shortness of breath, lightheadedness/dizziness, passing out, etc before he can be seen, he should call 911.   I will also route result to Dr. Domenic Polite so that he is aware and can provide any additional recommendations if needed.   Thank you!

## 2021-07-10 NOTE — Telephone Encounter (Signed)
I spoke with staff at [facility] [phone] and they agree to see A.Quinn,NP , today at 2 pm in the [place] office to discuss echo results

## 2021-07-10 NOTE — Progress Notes (Signed)
Cardiology Office Note  Date: 07/10/2021   ID: ALIM CATTELL, DOB [birth year], MRN [id number]  PCP:  [name], MD  Cardiologist:  Rozann Lesches, MD Electrophysiologist:  None   Chief Complaint: Large pericardial effusion    history of Present Illness: Kurt Nelson is a 65 y.o. male with a history of AAA, anemia, CKD, acute CHF, HTN, pericardial effusion, EtOH abuse, cocaine abuse, GERD, hyperglycemia, ICH, seizures, CVA.  Recent hospitalization 05/26/2021.  He presented to [facility], ED with lower extremity edema.  Chest x-ray noted cardiomegaly questionable pericardial effusion, 2D echo cardiogram noted large pericardial effusion without tamponade.  He was subsequently transferred to [facility].  Echocardiogram with preserved EF, grade 2 DD.  Echo raised concern for possible amyloidosis.  He underwent pericardiocentesis on 05/30/2021 with 2.1 L of fluid removed.  Fluid was sent for cytology, follow-up UPEP and immunofixation.  Blood pressure was stable.  His losartan, minoxidil and clonidine were discontinued.  He was continuing amlodipine and labetalol.  His creatinine was 2.5-3 range which was his baseline for CKD stage IV.  He was continuing aspirin and statin therapy.  Continuing phenytoin for history of CVA/seizures.  He is here for follow-up after recent hospital stay with large pericardial effusion status post pericardiocentesis with removal of 2150 cc of fluid.  Amyloid scan was negative for ATTR but there were issues with inadequate labeling.  Patient refused a follow-up scan.  He denies any lower extremity edema today.  Denies any DOE or SOB.  Denies any lightheadedness or dizziness, presyncope or syncopal episodes.  Denies any PND, orthopnea.  He has an upcoming follow-up echocardiogram on November 7 for follow-up on pericardial effusion.    He is here for follow-up after recent repeat echocardiogram demonstrating a large pericardial effusion.  He is asymptomatic.  Blood  pressure today is 138/88.  Provider mentioned he needed surgical evaluation for pericardial window given quick reaccumulation of fluid after recent pericardiocentesis during most recent hospital stay.  Has no clinical signs of tamponade.  Denies any shortness of breath, dizziness.   We discussed pericardial window procedure.  Patient is willing to go through the procedure.  Otherwise he denies any issues. We discussed being referred to CTS for surgical evaluation.   Past Medical History:  Diagnosis Date   AAA (abdominal aortic aneurysm)    Anemia, normocytic normochromic    CKD (chronic kidney disease) stage 3, GFR 30-59 ml/min (HCC)    Cocaine abuse (HCC)    ETOH abuse    GERD (gastroesophageal reflux disease)    Hyperglycemia    Hypertension    ICH (intracerebral hemorrhage) ([place])    Seizures ([place])    Stroke [facility])     Past Surgical History:  Procedure Laterality Date   PERICARDIOCENTESIS N/A 05/30/2021   Procedure: PERICARDIOCENTESIS;  Surgeon: Wellington Hampshire, MD;  Location: [place] [facility];  Service: Cardiovascular;  Laterality: N/A;    Current Outpatient Medications  Medication Sig Dispense Refill   amLODipine (NORVASC) 10 MG tablet Take 10 mg by mouth daily.     aspirin 81 MG tablet Take 81 mg by mouth daily.     atorvastatin (LIPITOR) 40 MG tablet Take 40 mg by mouth daily.     calcitRIOL (ROCALTROL) 0.25 MCG capsule Take 0.25 mcg by mouth See admin instructions. Take 1 capsule by mouth 3 times a weekly on Monday,Wednesday and Friday     Cholecalciferol 25 MCG (1000 UT) tablet Take 1 tablet by mouth daily.  5 tablet 0   cyanocobalamin 1000 MCG tablet Take 1 tablet (1,000 mcg total) by mouth daily. 4 tablet 0   dorzolamide-timolol (COSOPT) 22.3-6.8 MG/ML ophthalmic solution Place 1 drop into the left eye 2 (two) times daily.     fish oil-omega-3 fatty acids 1000 MG capsule Take 1 g by mouth daily.     furosemide (LASIX) 40 MG tablet Take 40 mg by mouth See admin  instructions. Take 1 tablet on Wednesday and Friday.     labetalol (NORMODYNE) 200 MG tablet Take 1 tablet (200 mg total) by mouth 2 (two) times daily. 60 tablet 0   levothyroxine (SYNTHROID) 125 MCG tablet Take 125 mcg by mouth daily before breakfast.     LUMIGAN 0.01 % SOLN Place 1 drop into both eyes at bedtime.     olopatadine (PATANOL) 0.1 % ophthalmic solution Place 1 drop into both eyes daily.     omeprazole (PRILOSEC) 20 MG capsule Take 20 mg by mouth daily.     phenytoin (DILANTIN) 100 MG ER capsule Take 300 mg by mouth at bedtime.      PRESCRIPTION MEDICATION Take 1 capsule by mouth daily. Vitamin D3 2000 i.u. Capsule     sodium bicarbonate 650 MG tablet Take 650 mg by mouth 2 (two) times daily.     sodium zirconium cyclosilicate (LOKELMA) 5 g packet Take 5 g by mouth as directed. Monday, Thursday     No current facility-administered medications for this visit.   Allergies:  Penicillins   Social History: The patient  reports that he has been smoking cigarettes. He has been smoking an average of .25 packs per day. He has never used smokeless tobacco. He reports that he does not drink alcohol and does not use drugs.   Family History: The patient's family history includes Hypertension in his mother, sister, and sister.   ROS:  Please see the history of present illness. Otherwise, complete review of systems is positive for none.  All other systems are reviewed and negative.   Physical Exam: VS:  BP 138/88   Pulse 66   Wt 127 lb 12.8 oz (58 kg)   SpO2 97%   BMI 20.02 kg/m , BMI Body mass index is 20.02 kg/m.  Wt Readings from Last 3 Encounters:  07/10/21 127 lb 12.8 oz (58 kg)  06/11/21 126 lb (57.2 kg)  06/01/21 114 lb 10.2 oz (52 kg)    General: Patient appears comfortable at rest. Neck: Supple, no elevated JVP or carotid bruits, no thyromegaly. Lungs: Clear to auscultation, nonlabored breathing at rest. Cardiac: Regular rate and rhythm, no S3 or significant systolic  murmur, no pericardial rub. Extremities: No pitting edema, distal pulses 2+. Skin: Warm and dry. Musculoskeletal: No kyphosis. Neuropsychiatric: Alert and oriented x3, affect grossly appropriate.  ECG:    Recent Labwork: 05/26/2021: B Natriuretic Peptide 954.0 05/29/2021: Magnesium 1.8 05/31/2021: ALT 23; AST 26; BUN 32; Creatinine, Ser 2.77; Hemoglobin 10.7; Platelets 219; Potassium 4.6; Sodium 138  No results found for: CHOL, TRIG, HDL, CHOLHDL, VLDL, LDLCALC, LDLDIRECT  Other Studies Reviewed Today:   Echocardiogram 07/09/2021  1. Large pericardial effusion. The pericardial effusion is  circumferential. There is normal IVC that is easily compressible. Though  respirometer not uses, I suspect there respirophasic variation in the  mitral valve inflow velocity.   2. Left ventricular ejection fraction, by estimation, is 60 to 65%. The  left ventricle has normal function. The left ventricle has no regional  wall motion abnormalities. There is severe  concentric left ventricular  hypertrophy.   3. Right ventricular systolic function is normal. The right ventricular  size is normal.   4. The mitral valve is normal in structure.   5. The inferior vena cava is normal in size with greater than 50%  respiratory variability, suggesting right atrial pressure of 3 mmHg.   Comparison(s): A prior study was performed on 05/30/21. Return of large  pericardial effusion: reaching out to priamry team.     NM scan Cardiac Amyloid 05/30/2021 IMPRESSION: Visual and quantitative assessment (grade 1, H/CLL equal 1.2) are NOT suggestive of transthyretin amyloidosis.   Of note the validity of the exam is in question as there is excessive free pertechnetate which would indicate poor pyrophosphate labeling. Patient was contacted to repeat exam. Patient refused to repeat exam.  Echocardiogram limited 05/30/2021 Limited study: pericardiocentesis. Large circumferential pericardial effusion with evidence  of diastolic RV collapse. Post pericardiocentesis 2.1 L serosanguinous fluid only trival effusion remaining With improvement in RV volume.  Assessment and Plan:  1. Pericardial effusion   2. Acute on chronic diastolic heart failure ([place])   3. Chronic kidney disease, stage IV (severe) ([place])   4. Essential hypertension   5. Mixed hyperlipidemia     1. Pericardial effusion Recent admission with large pericardial effusion.  He had a pericardiocentesis with 2150 cc of fluid removed.  Fluid was sent for cytology and cell counts.  He had an echocardiogram suspicious for cardiac amyloidosis.  Cardiac nuclear amyloid scan was negative for evidence of transthyretin amyloidosis.  Apparently there was inadequate labeling on scan and follow-up scan was suggested.  Patient refused follow-up scan.  He had a follow-up echocardiogram on November 7 to recheck which showed reaccumulation of fluid with large pericardial effusion, EF 60 to 65%, no WMA's, severe concentric LVH.  He is being referred to cardiothoracic surgery for pericardial window procedure.  2. Acute on chronic diastolic heart failure [facility]) Patient is euvolemic today.  Denies any weight gain, shortness of breath, PND, orthopnea, lower extremity edema.  Continue Lasix 40 mg on Wednesday and Friday  3. Chronic kidney disease, stage IV (severe) (HCC) Creatinine on 05/30/2021 was 2.73 and GFR 25.  Baseline creatinine is between 2.5 and 3.  4. Essential hypertension Blood pressure 138/88.  Continue labetalol 200 mg p.o. twice daily.  Continue Lasix 40 mg on Wednesday and Friday.  Continue amlodipine 10 mg daily.  5.  Hyperlipidemia Continue atorvastatin 40 mg daily.  Continue aspirin 81 mg daily.  Medication Adjustments/Labs and Tests Ordered: Current medicines are reviewed at length with the patient today.  Concerns regarding medicines are outlined above.   Disposition: Follow-up with Dr. Domenic Polite or APP 6 months  Signed, Levell July,  NP 07/10/2021 2:54 PM    [facility] at [place], [place], [place] [address] Phone: [phone]; Fax: [phone]

## 2021-07-10 NOTE — Patient Instructions (Addendum)
Medication Instructions:  Continue all current medications.  Labwork: none  Testing/Procedures: none  Follow-Up: 3 months   Any Other Special Instructions Will Be Listed Below (If Applicable). You have been referred to:  CVTS   If you need a refill on your cardiac medications before your next appointment, please call your pharmacy.

## 2021-07-11 ENCOUNTER — Encounter: Payer: Self-pay | Admitting: Family Medicine

## 2021-07-18 NOTE — H&P (View-Only) (Signed)
[address]       [place] [address]             [phone]        Jefferey L [facility]unn [facility] Medical Record #[id number] Date of Birth: 1956/06/09  Referring: Verta Ellen., NP Primary Care: [name], MD Primary Cardiologist:Samuel Domenic Polite, MD  Chief Complaint:    Chief Complaint  Patient presents with   Pericardial Effusion    Surgical consult, ECHO 07/09/21    History of Present Illness:     65 year old male referred for surgical evaluation of a recurrent pericardial effusion.  In September of this year he presented to the emergency department with lower extremity edema.  Echocardiogram showed a large pericardial effusion for which he underwent a pericardiocentesis which removed over 2 L of fluid.  On follow-up he had another echocardiogram in November which showed another large circumferential pericardial effusion.  He has been sent to assess the need for pericardial window.     Past Medical History:  Diagnosis Date   AAA (abdominal aortic aneurysm)    Anemia, normocytic normochromic    CKD (chronic kidney disease) stage 3, GFR 30-59 ml/min (HCC)    Cocaine abuse (HCC)    ETOH abuse    GERD (gastroesophageal reflux disease)    Hyperglycemia    Hypertension    ICH (intracerebral hemorrhage) ([place])    Seizures ([place])    Stroke [facility])     Past Surgical History:  Procedure Laterality Date   PERICARDIOCENTESIS N/A 05/30/2021   Procedure: PERICARDIOCENTESIS;  Surgeon: Wellington Hampshire, MD;  Location: [place] [facility];  Service: Cardiovascular;  Laterality: N/A;    Social History: Support: lives in a nursing home.  Presents today with his daughter  Social History   Tobacco Use  Smoking Status Light Smoker   Packs/day: 0.25   Types: Cigarettes  Smokeless Tobacco Never    Social History   Substance and Sexual Activity  Alcohol Use No   Alcohol/week: 0.0 standard drinks     Allergies  Allergen Reactions   Penicillins Itching  and Rash      Current Outpatient Medications  Medication Sig Dispense Refill   amLODipine (NORVASC) 10 MG tablet Take 10 mg by mouth daily.     aspirin 81 MG tablet Take 81 mg by mouth daily.     atorvastatin (LIPITOR) 40 MG tablet Take 40 mg by mouth daily.     calcitRIOL (ROCALTROL) 0.25 MCG capsule Take 0.25 mcg by mouth See admin instructions. Take 1 capsule by mouth 3 times a weekly on Monday,Wednesday and Friday     Cholecalciferol 25 MCG (1000 UT) tablet Take 1 tablet by mouth daily. 5 tablet 0   cyanocobalamin 1000 MCG tablet Take 1 tablet (1,000 mcg total) by mouth daily. 4 tablet 0   dorzolamide-timolol (COSOPT) 22.3-6.8 MG/ML ophthalmic solution Place 1 drop into the left eye 2 (two) times daily.     fish oil-omega-3 fatty acids 1000 MG capsule Take 1 g by mouth daily.     furosemide (LASIX) 40 MG tablet Take 40 mg by mouth See admin instructions. Take 1 tablet on Wednesday and Friday.     labetalol (NORMODYNE) 200 MG tablet Take 1 tablet (200 mg total) by mouth 2 (two) times daily. 60 tablet 0   levothyroxine (SYNTHROID) 125 MCG tablet Take 125 mcg by mouth daily before breakfast.     LUMIGAN 0.01 % SOLN Place 1 drop into  both eyes at bedtime.     olopatadine (PATANOL) 0.1 % ophthalmic solution Place 1 drop into both eyes daily.     omeprazole (PRILOSEC) 20 MG capsule Take 20 mg by mouth daily.     phenytoin (DILANTIN) 100 MG ER capsule Take 300 mg by mouth at bedtime.      PRESCRIPTION MEDICATION Take 1 capsule by mouth daily. Vitamin D3 2000 i.u. Capsule     sodium bicarbonate 650 MG tablet Take 650 mg by mouth 2 (two) times daily.     sodium zirconium cyclosilicate (LOKELMA) 5 g packet Take 5 g by mouth as directed. Monday, Thursday     No current facility-administered medications for this visit.    (Not in a hospital admission)   Family History  Problem Relation Age of Onset   Hypertension Mother    Hypertension Sister    Hypertension Sister      Review of  Systems:   Review of Systems  Constitutional: Negative.   Respiratory: Negative.    Cardiovascular: Negative.   Musculoskeletal: Negative.   Neurological: Negative.      Physical Exam: BP (!) 160/86 (BP Location: Right Arm, Patient Position: Sitting)   Pulse 88   Resp 20   Ht 5\' 7"  (1.702 m)   Wt 127 lb (57.6 kg)   SpO2 95% Comment: RA  BMI 19.89 kg/m  Physical Exam Constitutional:      General: He is not in acute distress.    Appearance: He is not ill-appearing.     Comments: Presents with a walker Dysarthric speech  HENT:     Head: Atraumatic.  Eyes:     Extraocular Movements: Extraocular movements intact.  Cardiovascular:     Rate and Rhythm: Normal rate.     Pulses: Normal pulses.     Heart sounds: No murmur heard. Pulmonary:     Effort: Pulmonary effort is normal. No respiratory distress.  Abdominal:     General: There is no distension.  Musculoskeletal:     Cervical back: Normal range of motion.  Neurological:     Mental Status: He is alert and oriented to person, place, and time. Mental status is at baseline.      Diagnostic Studies & Laboratory data:     Echo: IMPRESSIONS     1. Large pericardial effusion. The pericardial effusion is  circumferential. There is normal IVC that is easily compressible. Though  respirometer not uses, I suspect there respirophasic variation in the  mitral valve inflow velocity.   2. Left ventricular ejection fraction, by estimation, is 60 to 65%. The  left ventricle has normal function. The left ventricle has no regional  wall motion abnormalities. There is severe concentric left ventricular  hypertrophy.   3. Right ventricular systolic function is normal. The right ventricular  size is normal.   4. The mitral valve is normal in structure.   5. The inferior vena cava is normal in size with greater than 50%  respiratory variability, suggesting right atrial pressure of 3 mmHg.    I have independently reviewed the  above radiologic studies and discussed with the patient   Recent Lab Findings: Lab Results  Component Value Date   WBC 6.3 05/31/2021   HGB 10.7 (L) 05/31/2021   HCT 32.7 (L) 05/31/2021   PLT 219 05/31/2021   GLUCOSE 100 (H) 05/31/2021   ALT 23 05/31/2021   AST 26 05/31/2021   NA 138 05/31/2021   K 4.6 05/31/2021   CL 108 05/31/2021  CREATININE 2.77 (H) 05/31/2021   BUN 32 (H) 05/31/2021   CO2 23 05/31/2021      Assessment / Plan:   65 year old male with a recurrence circumferential pericardial effusion.  He is already undergone pericardiocentesis, but developed rapid reaccumulation over the course of 2 months.  At the original pericardiocentesis over 2 L of fluid was removed.  He had a very large stroke 10 years ago and has significant residual deficits on the left side.  He does require a walker for ambulation.  He is currently living in a nursing home.  Surprisingly he does not complain of much shortness of breath.  His lower extremity swelling is also improved.  We discussed the risks and benefits of a robotic assisted right pericardial window.  He is agreeable to proceed with this plan.  He is tentatively scheduled for November 28.  We will obtain a CT chest prior to his operation.       I  spent 40 minutes counseling the patient face to face.   Lajuana Matte 07/20/2021 12:37 PM

## 2021-07-18 NOTE — Progress Notes (Signed)
[address]       [place] [address]             [phone]        Mounir L [facility]aby [facility] Medical Record #[id number] Date of Birth: [birth year]  Referring: Verta Ellen., NP Primary Care: [name], MD Primary Cardiologist:Samuel Domenic Polite, MD  Chief Complaint:    Chief Complaint  Patient presents with   Pericardial Effusion    Surgical consult, ECHO 07/09/21    History of Present Illness:     65 year old male referred for surgical evaluation of a recurrent pericardial effusion.  In September of this year he presented to the emergency department with lower extremity edema.  Echocardiogram showed a large pericardial effusion for which he underwent a pericardiocentesis which removed over 2 L of fluid.  On follow-up he had another echocardiogram in November which showed another large circumferential pericardial effusion.  He has been sent to assess the need for pericardial window.     Past Medical History:  Diagnosis Date   AAA (abdominal aortic aneurysm)    Anemia, normocytic normochromic    CKD (chronic kidney disease) stage 3, GFR 30-59 ml/min (HCC)    Cocaine abuse (HCC)    ETOH abuse    GERD (gastroesophageal reflux disease)    Hyperglycemia    Hypertension    ICH (intracerebral hemorrhage) ([place])    Seizures ([place])    Stroke [facility])     Past Surgical History:  Procedure Laterality Date   PERICARDIOCENTESIS N/A 05/30/2021   Procedure: PERICARDIOCENTESIS;  Surgeon: Wellington Hampshire, MD;  Location: [place] [facility];  Service: Cardiovascular;  Laterality: N/A;    Social History: Support: lives in a nursing home.  Presents today with his daughter  Social History   Tobacco Use  Smoking Status Light Smoker   Packs/day: 0.25   Types: Cigarettes  Smokeless Tobacco Never    Social History   Substance and Sexual Activity  Alcohol Use No   Alcohol/week: 0.0 standard drinks     Allergies  Allergen Reactions   Penicillins Itching  and Rash      Current Outpatient Medications  Medication Sig Dispense Refill   amLODipine (NORVASC) 10 MG tablet Take 10 mg by mouth daily.     aspirin 81 MG tablet Take 81 mg by mouth daily.     atorvastatin (LIPITOR) 40 MG tablet Take 40 mg by mouth daily.     calcitRIOL (ROCALTROL) 0.25 MCG capsule Take 0.25 mcg by mouth See admin instructions. Take 1 capsule by mouth 3 times a weekly on Monday,Wednesday and Friday     Cholecalciferol 25 MCG (1000 UT) tablet Take 1 tablet by mouth daily. 5 tablet 0   cyanocobalamin 1000 MCG tablet Take 1 tablet (1,000 mcg total) by mouth daily. 4 tablet 0   dorzolamide-timolol (COSOPT) 22.3-6.8 MG/ML ophthalmic solution Place 1 drop into the left eye 2 (two) times daily.     fish oil-omega-3 fatty acids 1000 MG capsule Take 1 g by mouth daily.     furosemide (LASIX) 40 MG tablet Take 40 mg by mouth See admin instructions. Take 1 tablet on Wednesday and Friday.     labetalol (NORMODYNE) 200 MG tablet Take 1 tablet (200 mg total) by mouth 2 (two) times daily. 60 tablet 0   levothyroxine (SYNTHROID) 125 MCG tablet Take 125 mcg by mouth daily before breakfast.     LUMIGAN 0.01 % SOLN Place 1 drop into  both eyes at bedtime.     olopatadine (PATANOL) 0.1 % ophthalmic solution Place 1 drop into both eyes daily.     omeprazole (PRILOSEC) 20 MG capsule Take 20 mg by mouth daily.     phenytoin (DILANTIN) 100 MG ER capsule Take 300 mg by mouth at bedtime.      PRESCRIPTION MEDICATION Take 1 capsule by mouth daily. Vitamin D3 2000 i.u. Capsule     sodium bicarbonate 650 MG tablet Take 650 mg by mouth 2 (two) times daily.     sodium zirconium cyclosilicate (LOKELMA) 5 g packet Take 5 g by mouth as directed. Monday, Thursday     No current facility-administered medications for this visit.    (Not in a hospital admission)   Family History  Problem Relation Age of Onset   Hypertension Mother    Hypertension Sister    Hypertension Sister      Review of  Systems:   Review of Systems  Constitutional: Negative.   Respiratory: Negative.    Cardiovascular: Negative.   Musculoskeletal: Negative.   Neurological: Negative.      Physical Exam: BP (!) 160/86 (BP Location: Right Arm, Patient Position: Sitting)   Pulse 88   Resp 20   Ht 5\' 7"  (1.702 m)   Wt 127 lb (57.6 kg)   SpO2 95% Comment: RA  BMI 19.89 kg/m  Physical Exam Constitutional:      General: He is not in acute distress.    Appearance: He is not ill-appearing.     Comments: Presents with a walker Dysarthric speech  HENT:     Head: Atraumatic.  Eyes:     Extraocular Movements: Extraocular movements intact.  Cardiovascular:     Rate and Rhythm: Normal rate.     Pulses: Normal pulses.     Heart sounds: No murmur heard. Pulmonary:     Effort: Pulmonary effort is normal. No respiratory distress.  Abdominal:     General: There is no distension.  Musculoskeletal:     Cervical back: Normal range of motion.  Neurological:     Mental Status: He is alert and oriented to person, place, and time. Mental status is at baseline.      Diagnostic Studies & Laboratory data:     Echo: IMPRESSIONS     1. Large pericardial effusion. The pericardial effusion is  circumferential. There is normal IVC that is easily compressible. Though  respirometer not uses, I suspect there respirophasic variation in the  mitral valve inflow velocity.   2. Left ventricular ejection fraction, by estimation, is 60 to 65%. The  left ventricle has normal function. The left ventricle has no regional  wall motion abnormalities. There is severe concentric left ventricular  hypertrophy.   3. Right ventricular systolic function is normal. The right ventricular  size is normal.   4. The mitral valve is normal in structure.   5. The inferior vena cava is normal in size with greater than 50%  respiratory variability, suggesting right atrial pressure of 3 mmHg.    I have independently reviewed the  above radiologic studies and discussed with the patient   Recent Lab Findings: Lab Results  Component Value Date   WBC 6.3 05/31/2021   HGB 10.7 (L) 05/31/2021   HCT 32.7 (L) 05/31/2021   PLT 219 05/31/2021   GLUCOSE 100 (H) 05/31/2021   ALT 23 05/31/2021   AST 26 05/31/2021   NA 138 05/31/2021   K 4.6 05/31/2021   CL 108 05/31/2021  CREATININE 2.77 (H) 05/31/2021   BUN 32 (H) 05/31/2021   CO2 23 05/31/2021      Assessment / Plan:   65 year old male with a recurrence circumferential pericardial effusion.  He is already undergone pericardiocentesis, but developed rapid reaccumulation over the course of 2 months.  At the original pericardiocentesis over 2 L of fluid was removed.  He had a very large stroke 10 years ago and has significant residual deficits on the left side.  He does require a walker for ambulation.  He is currently living in a nursing home.  Surprisingly he does not complain of much shortness of breath.  His lower extremity swelling is also improved.  We discussed the risks and benefits of a robotic assisted right pericardial window.  He is agreeable to proceed with this plan.  He is tentatively scheduled for November 28.  We will obtain a CT chest prior to his operation.       I  spent 40 minutes counseling the patient face to face.   Lajuana Matte 07/20/2021 12:37 PM

## 2021-07-20 ENCOUNTER — Other Ambulatory Visit: Payer: Self-pay | Admitting: *Deleted

## 2021-07-20 ENCOUNTER — Encounter: Payer: Self-pay | Admitting: *Deleted

## 2021-07-20 ENCOUNTER — Institutional Professional Consult (permissible substitution) (INDEPENDENT_AMBULATORY_CARE_PROVIDER_SITE_OTHER): Payer: Medicare Other | Admitting: Thoracic Surgery (Cardiothoracic Vascular Surgery)

## 2021-07-20 ENCOUNTER — Other Ambulatory Visit: Payer: Self-pay

## 2021-07-20 VITALS — BP 160/86 | HR 88 | Resp 20 | Ht 67.0 in | Wt 127.0 lb

## 2021-07-20 DIAGNOSIS — I3139 Other pericardial effusion (noninflammatory): Secondary | ICD-10-CM

## 2021-07-20 DIAGNOSIS — Z5181 Encounter for therapeutic drug level monitoring: Secondary | ICD-10-CM

## 2021-07-24 NOTE — Progress Notes (Signed)
Surgical Instructions    Your procedure is scheduled on Monday, November 28th.  Report to [facility] Main Entrance "A" at 9:00 A.M., then check in with the Admitting office.  Call this number if you have problems the morning of surgery:  [phone]   If you have any questions prior to your surgery date call [phone]: Open Monday-Friday 8am-4pm    Remember:  Do not eat or drink after midnight the night before your surgery     Take these medicines the morning of surgery with A SIP OF WATER   Amlodipine (Norvasc)  Atorvastatin (Lipitor)  Labetalol (Normodyne)  Levothyroxine (Synthroid)  Omeprazole (Prilosec)   If needed:  Eye drops  Follow your surgeon's instructions on when to stop Aspirin.  If no instructions were given by your surgeon then you will need to call the office to get those instructions.    As of today, STOP taking Aleve, Naproxen, Ibuprofen, Motrin, Advil, Goody's, BC's, all herbal medications, fish oil, and all vitamins.   After your COVID test   You are not required to quarantine however you are required to wear a well-fitting mask when you are out and around people not in your household.  If your mask becomes wet or soiled, replace with a new one.  Wash your hands often with soap and water for 20 seconds or clean your hands with an alcohol-based hand sanitizer that contains at least 60% alcohol.  Do not share personal items.  Notify your provider: if you are in close contact with someone who has COVID  or if you develop a fever of 100.4 or greater, sneezing, cough, sore throat, shortness of breath or body aches.   DAY OF SURGERY         Do not wear jewelry  Do not wear lotions, powders, colognes, or deodorant. Men may shave face and neck. Do not bring valuables to the hospital.             [facility] is not responsible for any belongings or valuables.  Do NOT Smoke (Tobacco/Vaping)  24 hours prior to your procedure  If you use a CPAP at  night, you may bring your mask for your overnight stay.   Contacts, glasses, hearing aids, dentures or partials may not be worn into surgery, please bring cases for these belongings   For patients admitted to the hospital, discharge time will be determined by your treatment team.   Patients discharged the day of surgery will not be allowed to drive home, and someone needs to stay with them for 24 hours.  NO VISITORS WILL BE ALLOWED IN PRE-OP WHERE PATIENTS ARE PREPPED FOR SURGERY.  ONLY 1 SUPPORT PERSON MAY BE PRESENT IN THE WAITING ROOM WHILE YOU ARE IN SURGERY.  IF YOU ARE TO BE ADMITTED, ONCE YOU ARE IN YOUR ROOM YOU WILL BE ALLOWED TWO (2) VISITORS. 1 (ONE) VISITOR MAY STAY OVERNIGHT BUT MUST ARRIVE TO THE ROOM BY 8pm.  Minor children may have two parents present. Special consideration for safety and communication needs will be reviewed on a case by case basis.  Special instructions:    Oral Hygiene is also important to reduce your risk of infection.  Remember - BRUSH YOUR TEETH THE MORNING OF SURGERY WITH YOUR REGULAR TOOTHPASTE   [facility]- Preparing For Surgery  Before surgery, you can play an important role. Because skin is not sterile, your skin needs to be as free of germs as possible. You can reduce the number of  germs on your skin by washing with CHG (chlorahexidine gluconate) Soap before surgery.  CHG is an antiseptic cleaner which kills germs and bonds with the skin to continue killing germs even after washing.     Please do not use if you have an allergy to CHG or antibacterial soaps. If your skin becomes reddened/irritated stop using the CHG.  Do not shave (including legs and underarms) for at least 48 hours prior to first CHG shower. It is OK to shave your face.  Please follow these instructions carefully.     Shower the NIGHT BEFORE SURGERY and the MORNING OF SURGERY with CHG Soap.   If you chose to wash your hair, wash your hair first as usual with your normal shampoo.  After you shampoo, rinse your hair and body thoroughly to remove the shampoo.  Then [employer] and genitals (private parts) with your normal soap and rinse thoroughly to remove soap.  After that Use CHG Soap as you would any other liquid soap. You can apply CHG directly to the skin and wash gently with a scrungie or a clean washcloth.   Apply the CHG Soap to your body ONLY FROM THE NECK DOWN.  Do not use on open wounds or open sores. Avoid contact with your eyes, ears, mouth and genitals (private parts). Wash Face and genitals (private parts)  with your normal soap.   Wash thoroughly, paying special attention to the area where your surgery will be performed.  Thoroughly rinse your body with warm water from the neck down.  DO NOT shower/wash with your normal soap after using and rinsing off the CHG Soap.  Pat yourself dry with a CLEAN TOWEL.  Wear CLEAN PAJAMAS to bed the night before surgery  Place CLEAN SHEETS on your bed the night before your surgery  DO NOT SLEEP WITH PETS.   Day of Surgery:  Take a shower with CHG soap. Wear Clean/Comfortable clothing the morning of surgery Do not apply any deodorants/lotions.   Remember to brush your teeth WITH YOUR REGULAR TOOTHPASTE.   Please read over the following fact sheets that you were given.

## 2021-07-25 NOTE — Progress Notes (Signed)
Anesthesia Chart Review:  Case: [id number] Date/Time: 07/30/21 1045   Procedure: XI ROBOTIC ASSISTED THORACOSCOPY PERICARDIAL WINDOW (Right)   Anesthesia type: General   Pre-op diagnosis: PERICARDIAL EFFUSION   Location: [facility] OR ROOM 10 / [facility]   Surgeons: Lajuana Matte, MD       DISCUSSION: Patient is a 65 year old male scheduled for the above procedure. Hospitalized 05/26/21-06/01/21 for dyspnea and edema, worse X 2-3 days. Required O2 at 2L. BNP 954, and CXR concerning for cardiomegaly with possible pericardial effusion. (Of note, pericardial effusion previously noted on 01/17/21 vascular US was was discussed with patient's PCP at that time.)  Echo showed LVEF 60-65%, severe LVH, grade 2 DD, hypertrophic cardiomyopathy. Cardiology consulted. Cardiac amyloid scan was not suggestive of transthyretin amyloidosis, but validity of exam was in question due to inadequate labeling, and patient refused a repeat exam. S/p pericardiocentesis 05/30/21 with reaccumulation on 07/09/21 follow-up echocardiogram.  He was evaluated by Dr. Kipp Brood on 07/20/2021.  He notes "Surprisingly he does not complain of much shortness of breath.  His lower extremity swelling is also improved."  Robotic assisted right pericardial window recommended for recurrent pericardial effusion.  Patient was living in [facility] in [place].   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   Last cardiology visit 07/10/2021 with Levell July, NP.  The accumulation of pericardial fluid on 07/09/2021 echocardiogram.  Patient without clinical signs of tamponade.  No shortness of breath or dizziness.  He was referred to  CT surgery.  Per PAT RN, patient denied any acute cardiopulmonary symptoms at his PAT visit.   07/27/2021 presurgical COVID-19 test in process. Anesthesia team to evaluate on the day of surgery.   VS: BP (!) 165/97 (BP Location: Left Arm)   Pulse 72   Temp 36.9 C (Oral)   Resp 18   Ht 5\' 7"  (1.702 m)   SpO2 100%   BMI 19.89 kg/m  BP 165/105, recheck 165/97   PROVIDERS: [name], MD is PCP  Rozann Lesches, MD is cardiologist Aggie Hacker, MBBS is nephrologist. Last visit 06/13/21. Note indicates that Cr ~ 2.4-2.7 range and up to 2.8-3.0 range in 2021.  Deitra Mayo, MD is vascular surgeon   LABS: Labs reviewed: Acceptable for surgery. Cr 2.50, but consistent with prior labs and known history of CKD. (all labs ordered are listed, but only abnormal results are displayed)  Labs Reviewed  CBC - Abnormal; Notable for the following components:      Result Value   Hemoglobin 12.4 (*)    HCT 38.7 (*)    All other components within normal limits  COMPREHENSIVE METABOLIC PANEL - Abnormal; Notable for the following components:   CO2 19 (*)    Glucose, Bld 102 (*)    BUN 44 (*)    Creatinine, Ser 2.50 (*)    Alkaline Phosphatase 180 (*)    GFR, Estimated 28 (*)    All other components within normal limits  BLOOD GAS, ARTERIAL - Abnormal; Notable for the following components:   pH, Arterial 7.334 (*)    Acid-base deficit 4.3 (*)    Allens test (pass/fail) BRACHIAL ARTERY (*)    All other components within normal limits  URINALYSIS, ROUTINE W REFLEX MICROSCOPIC - Abnormal;  Notable for the following components:   Protein, ur 100 (*)    All other components within normal limits  SARS CORONAVIRUS 2 (TAT 6-24 HRS)  SURGICAL PCR SCREEN  PROTIME-INR  APTT  TYPE AND SCREEN    IMAGES: CXR 07/27/21:  FINDINGS: Lung volumes are normal. No consolidative airspace disease. No pleural effusions. No pneumothorax. No evidence of pulmonary edema. Cardiopericardial  silhouette is enlarged, but decreased compared to the prior study. Upper mediastinal contours are within normal limits. Atherosclerotic calcifications in the thoracic aorta. IMPRESSION: 1. Improved enlargement of the cardiopericardial silhouette, which likely reflects decreasing size of known pericardial effusion. 2. No other radiographic evidence of acute cardiopulmonary disease.    EKG:  EKG 07/27/21: Sinus rhythm with Premature atrial complexes Possible Left atrial enlargement Left ventricular hypertrophy with repolarization abnormality ( Sokolow-Lyon , Cornell product , Romhilt-Estes ) Abnormal ECG - Known LVH with repolarization abnormality.  EKG 05/28/2021:  Sinus rhythm Consider right atrial enlargement Repol abnrm suggests ischemia, anterolateral Artifact in lead(s) I II aVR aVL aVF V2 V3 V4 V5 V6 No significant change since last tracing Confirmed by Isla Pence [phone]) on 05/26/2021 12:16:08 PM   CV: Echocardiogram (limited) 07/09/2021  1. Large pericardial effusion. The pericardial effusion is  circumferential. There is normal IVC that is easily compressible. Though  respirometer not uses, I suspect there respirophasic variation in the  mitral valve inflow velocity.   2. Left ventricular ejection fraction, by estimation, is 60 to 65%. The  left ventricle has normal function. The left ventricle has no regional  wall motion abnormalities. There is severe concentric left ventricular  hypertrophy.   3. Right ventricular systolic function is normal. The right ventricular  size is normal.   4. The mitral valve is normal in structure.   5. The inferior vena cava is normal in size with greater than 50%  respiratory variability, suggesting right atrial pressure of 3 mmHg.  - Comparison(s): A prior study was performed on 05/30/21. Return of large  pericardial effusion: reaching out to priamry team.      NM scan Cardiac Amyloid 05/30/2021 IMPRESSION: Visual and  quantitative assessment (grade 1, H/CLL equal 1.2) are NOT suggestive of transthyretin amyloidosis. - Of note the validity of the exam is in question as there is excessive free pertechnetate which would indicate poor pyrophosphate labeling. Patient was contacted to repeat exam. Patient refused to repeat exam.     Pericardiocentesis 05/30/2021 Successful pericardiocentesis via the subxiphoid area with removal of 2150 mL of serosanguineous fluid. There was resolution of pericardial effusion on echocardiogram which was being done at the same time. The catheter was removed at the end of the case. Recommendations: Fluid was sent for analysis [no malignant cells identified].  Continue supportive care.   Echocardiogram (Complete) 05/27/2021 IMPRESSIONS   1. Consider amyloidosis.   2. Left ventricular ejection fraction, by estimation, is 60 to 65%. The  left ventricle has normal function. The left ventricle has no regional  wall motion abnormalities. There is severe concentric left ventricular  hypertrophy. Left ventricular diastolic   parameters are consistent with Grade II diastolic dysfunction  (pseudonormalization).   3. Right ventricular systolic function is normal. The right ventricular  size is normal. There is normal pulmonary artery systolic pressure. The  estimated right ventricular systolic pressure is 94.1 mmHg.   4. 5 cm pericardial effusion. Very large. Although there is no RV/ RA  collpase, pericardial tamponade remains a clinical bedside diagnosis.  Large pericardial effusion. The pericardial effusion is  circumferential.   5. The mitral valve is normal in structure. No evidence of mitral valve  regurgitation. No evidence of mitral stenosis.   6. The aortic valve is normal in structure. Aortic valve regurgitation is  not visualized. No aortic stenosis is present.   7. The inferior vena cava is normal in size with greater than 50%  respiratory variability, suggesting right  atrial pressure of 3 mmHg.  - Conclusion(s)/Recommendation(s): Findings consistent with hypertrophic  cardiomyopathy.    [place] Abd Aorta 01/17/2021 Summary:  Abdominal Aorta: There is evidence of abnormal dilatation of the distal  Abdominal aorta [3.11 cm AP]. There is evidence of abnormal dilation of the Right  Common Iliac artery. Prior right common iliac diameters on 01/18/2020 was  2.1 cm . Incidental finding of  significant fluid around the heart. The largest aortic diameter remains  essentially unchanged compared to prior exam. Previous diameter  measurement was 3.1 cm obtained on 01/18/2020.    Past Medical History:  Diagnosis Date   AAA (abdominal aortic aneurysm)    Anemia, normocytic normochromic    CKD (chronic kidney disease) stage 3, GFR 30-59 ml/min (HCC)    Cocaine abuse (HCC)    ETOH abuse    GERD (gastroesophageal reflux disease)    Hyperglycemia    Hypertension    Hypothyroidism    ICH (intracerebral hemorrhage) ([place])    Seizures ([place])    Stroke [facility])     Past Surgical History:  Procedure Laterality Date   PERICARDIOCENTESIS N/A 05/30/2021   Procedure: PERICARDIOCENTESIS;  Surgeon: Wellington Hampshire, MD;  Location: [facility];  Service: Cardiovascular;  Laterality: N/A;    MEDICATIONS:  amLODipine (NORVASC) 10 MG tablet   aspirin 81 MG tablet   atorvastatin (LIPITOR) 40 MG tablet   calcitRIOL (ROCALTROL) 0.25 MCG capsule   Cholecalciferol 25 MCG (1000 UT) tablet   cyanocobalamin 1000 MCG tablet   dorzolamide-timolol (COSOPT) 22.3-6.8 MG/ML ophthalmic solution   fish oil-omega-3 fatty acids 1000 MG capsule   furosemide (LASIX) 40 MG tablet   labetalol (NORMODYNE) 200 MG tablet   levothyroxine (SYNTHROID) 125 MCG tablet   LUMIGAN 0.01 % SOLN   olopatadine (PATANOL) 0.1 % ophthalmic solution   omeprazole (PRILOSEC) 20 MG capsule   phenytoin (DILANTIN) 100 MG ER capsule   PRESCRIPTION MEDICATION   sodium bicarbonate 650 MG tablet   sodium  zirconium cyclosilicate (LOKELMA) 5 g packet   No current facility-administered medications for this encounter.    Myra Gianotti, PA-C Surgical Short Stay/Anesthesiology [facility] Phone [phone] [facility] Phone [phone] 07/27/2021 1:32 PM

## 2021-07-27 ENCOUNTER — Encounter (HOSPITAL_COMMUNITY): Payer: Self-pay

## 2021-07-27 ENCOUNTER — Other Ambulatory Visit: Payer: Self-pay

## 2021-07-27 ENCOUNTER — Encounter (HOSPITAL_COMMUNITY)
Admission: RE | Admit: 2021-07-27 | Discharge: 2021-07-27 | Disposition: A | Discharge status: Home / Self Care | Payer: Medicare Other | Source: Ambulatory Visit | Attending: Thoracic Surgery (Cardiothoracic Vascular Surgery) | Admitting: Thoracic Surgery (Cardiothoracic Vascular Surgery)

## 2021-07-27 ENCOUNTER — Ambulatory Visit (HOSPITAL_COMMUNITY)
Admission: RE | Admit: 2021-07-27 | Discharge: 2021-07-27 | Disposition: A | Discharge status: Home / Self Care | Payer: Medicare Other | Source: Ambulatory Visit | Attending: Thoracic Surgery (Cardiothoracic Vascular Surgery) | Admitting: Thoracic Surgery (Cardiothoracic Vascular Surgery)

## 2021-07-27 VITALS — BP 165/97 | HR 72 | Temp 98.5°F | Resp 18 | Ht 67.0 in

## 2021-07-27 DIAGNOSIS — I3139 Other pericardial effusion (noninflammatory): Secondary | ICD-10-CM | POA: Insufficient documentation

## 2021-07-27 DIAGNOSIS — Z5181 Encounter for therapeutic drug level monitoring: Secondary | ICD-10-CM

## 2021-07-27 DIAGNOSIS — Z01818 Encounter for other preprocedural examination: Secondary | ICD-10-CM

## 2021-07-27 HISTORY — DX: Hypothyroidism, unspecified: E03.9

## 2021-07-27 LAB — COMPREHENSIVE METABOLIC PANEL
ALT: 22 U/L (ref 0–44)
AST: 21 U/L (ref 15–41)
Albumin: 3.5 g/dL (ref 3.5–5.0)
Alkaline Phosphatase: 180 U/L — ABNORMAL HIGH (ref 38–126)
Anion gap: 11 (ref 5–15)
BUN: 44 mg/dL — ABNORMAL HIGH (ref 8–23)
CO2: 19 mmol/L — ABNORMAL LOW (ref 22–32)
Calcium: 9 mg/dL (ref 8.9–10.3)
Chloride: 106 mmol/L (ref 98–111)
Creatinine, Ser: 2.5 mg/dL — ABNORMAL HIGH (ref 0.61–1.24)
GFR, Estimated: 28 mL/min — ABNORMAL LOW (ref >60)
Glucose, Bld: 102 mg/dL — ABNORMAL HIGH (ref 70–99)
Potassium: 4.6 mmol/L (ref 3.5–5.1)
Sodium: 136 mmol/L (ref 135–145)
Total Bilirubin: 0.4 mg/dL (ref 0.3–1.2)
Total Protein: 7.2 g/dL (ref 6.5–8.1)

## 2021-07-27 LAB — URINALYSIS, ROUTINE W REFLEX MICROSCOPIC
Bacteria, UA: NONE SEEN
Bilirubin Urine: NEGATIVE
Glucose, UA: NEGATIVE mg/dL
Hgb urine dipstick: NEGATIVE
Ketones, ur: NEGATIVE mg/dL
Leukocytes,Ua: NEGATIVE
Nitrite: NEGATIVE
Protein, ur: 100 mg/dL — AB
Specific Gravity, Urine: 1.012 (ref 1.005–1.030)
pH: 6 (ref 5.0–8.0)

## 2021-07-27 LAB — CBC
HCT: 38.7 % — ABNORMAL LOW (ref 39.0–52.0)
Hemoglobin: 12.4 g/dL — ABNORMAL LOW (ref 13.0–17.0)
MCH: 27.9 pg (ref 26.0–34.0)
MCHC: 32 g/dL (ref 30.0–36.0)
MCV: 87.2 fL (ref 80.0–100.0)
Platelets: 189 10*3/uL (ref 150–400)
RBC: 4.44 MIL/uL (ref 4.22–5.81)
RDW: 12.6 % (ref 11.5–15.5)
WBC: 6.5 10*3/uL (ref 4.0–10.5)
nRBC: 0 % (ref 0.0–0.2)

## 2021-07-27 LAB — SARS CORONAVIRUS 2 (TAT 6-24 HRS): SARS Coronavirus 2: NEGATIVE

## 2021-07-27 LAB — APTT: aPTT: 29 seconds (ref 24–36)

## 2021-07-27 LAB — BLOOD GAS, ARTERIAL
Acid-base deficit: 4.3 mmol/L — ABNORMAL HIGH (ref 0.0–2.0)
Bicarbonate: 20.5 mmol/L (ref 20.0–28.0)
Drawn by: 602861
FIO2: 21
O2 Saturation: 97.5 %
Patient temperature: 37
pCO2 arterial: 39.7 mmHg (ref 32.0–48.0)
pH, Arterial: 7.334 — ABNORMAL LOW (ref 7.350–7.450)
pO2, Arterial: 101 mmHg (ref 83.0–108.0)

## 2021-07-27 LAB — PROTIME-INR
INR: 1 (ref 0.8–1.2)
Prothrombin Time: 12.8 seconds (ref 11.4–15.2)

## 2021-07-27 LAB — SURGICAL PCR SCREEN
MRSA, PCR: NEGATIVE
Staphylococcus aureus: NEGATIVE

## 2021-07-27 LAB — TYPE AND SCREEN
ABO/RH(D): A POS
Antibody Screen: NEGATIVE

## 2021-07-27 NOTE — Progress Notes (Signed)
Surgical Instructions    Your procedure is scheduled on Monday, November 28th.  Report to [facility] Main Entrance "A" at 9:00 A.M., then check in with the Admitting office.  Call this number if you have problems the morning of surgery:  [phone]   If you have any questions prior to your surgery date call [phone]: Open Monday-Friday 8am-4pm    Remember:  Do not eat or drink after midnight the night before your surgery     Take these medicines the morning of surgery with A SIP OF WATER   Amlodipine (Norvasc)  Atorvastatin (Lipitor)  Labetalol (Normodyne)  Levothyroxine (Synthroid)  Omeprazole (Prilosec)   If needed:  Eye drops   Do not take Aspirin the day of surgery  As of today, STOP taking Aleve, Naproxen, Ibuprofen, Motrin, Advil, Goody's, BC's, all herbal medications, fish oil, and all vitamins.   After your COVID test   You are not required to quarantine however you are required to wear a well-fitting mask when you are out and around people not in your household.  If your mask becomes wet or soiled, replace with a new one.  Wash your hands often with soap and water for 20 seconds or clean your hands with an alcohol-based hand sanitizer that contains at least 60% alcohol.  Do not share personal items.  Notify your provider: if you are in close contact with someone who has COVID  or if you develop a fever of 100.4 or greater, sneezing, cough, sore throat, shortness of breath or body aches.   DAY OF SURGERY         Do not wear jewelry  Do not wear lotions, powders, colognes, or deodorant. Men may shave face and neck. Do not bring valuables to the hospital.             [facility] is not responsible for any belongings or valuables.  Do NOT Smoke (Tobacco/Vaping)  24 hours prior to your procedure  If you use a CPAP at night, you may bring your mask for your overnight stay.   Contacts, glasses, hearing aids, dentures or partials may not be worn into  surgery, please bring cases for these belongings   For patients admitted to the hospital, discharge time will be determined by your treatment team.   Patients discharged the day of surgery will not be allowed to drive home, and someone needs to stay with them for 24 hours.  NO VISITORS WILL BE ALLOWED IN PRE-OP WHERE PATIENTS ARE PREPPED FOR SURGERY.  ONLY 1 SUPPORT PERSON MAY BE PRESENT IN THE WAITING ROOM WHILE YOU ARE IN SURGERY.  IF YOU ARE TO BE ADMITTED, ONCE YOU ARE IN YOUR ROOM YOU WILL BE ALLOWED TWO (2) VISITORS. 1 (ONE) VISITOR MAY STAY OVERNIGHT BUT MUST ARRIVE TO THE ROOM BY 8pm.  Minor children may have two parents present. Special consideration for safety and communication needs will be reviewed on a case by case basis.  Special instructions:    Oral Hygiene is also important to reduce your risk of infection.  Remember - BRUSH YOUR TEETH THE MORNING OF SURGERY WITH YOUR REGULAR TOOTHPASTE   [facility]- Preparing For Surgery  Before surgery, you can play an important role. Because skin is not sterile, your skin needs to be as free of germs as possible. You can reduce the number of germs on your skin by washing with CHG (chlorahexidine gluconate) Soap before surgery.  CHG is an antiseptic cleaner which kills germs and  bonds with the skin to continue killing germs even after washing.     Please do not use if you have an allergy to CHG or antibacterial soaps. If your skin becomes reddened/irritated stop using the CHG.  Do not shave (including legs and underarms) for at least 48 hours prior to first CHG shower. It is OK to shave your face.  Please follow these instructions carefully.     Shower the NIGHT BEFORE SURGERY and the MORNING OF SURGERY with CHG Soap.   If you chose to wash your hair, wash your hair first as usual with your normal shampoo. After you shampoo, rinse your hair and body thoroughly to remove the shampoo.  Then [employer] and genitals (private parts) with your  normal soap and rinse thoroughly to remove soap.  After that Use CHG Soap as you would any other liquid soap. You can apply CHG directly to the skin and wash gently with a scrungie or a clean washcloth.   Apply the CHG Soap to your body ONLY FROM THE NECK DOWN.  Do not use on open wounds or open sores. Avoid contact with your eyes, ears, mouth and genitals (private parts). Wash Face and genitals (private parts)  with your normal soap.   Wash thoroughly, paying special attention to the area where your surgery will be performed.  Thoroughly rinse your body with warm water from the neck down.  DO NOT shower/wash with your normal soap after using and rinsing off the CHG Soap.  Pat yourself dry with a CLEAN TOWEL.  Wear CLEAN PAJAMAS to bed the night before surgery  Place CLEAN SHEETS on your bed the night before your surgery  DO NOT SLEEP WITH PETS.   Day of Surgery:  Take a shower with CHG soap. Wear Clean/Comfortable clothing the morning of surgery Do not apply any deodorants/lotions.   Remember to brush your teeth WITH YOUR REGULAR TOOTHPASTE.   Please read over the following fact sheets that you were given.

## 2021-07-27 NOTE — Anesthesia Preprocedure Evaluation (Addendum)
Anesthesia Evaluation  Patient identified by MRN, date of birth, ID band Patient awake    Reviewed: Allergy & Precautions, NPO status , Patient's Chart, lab work & pertinent test results  History of Anesthesia Complications Negative for: history of anesthetic complications  Airway Mallampati: II  TM Distance: >3 FB Neck ROM: Full    Dental  (+) Edentulous Upper, Edentulous Lower   Pulmonary Current Smoker and Patient abstained from smoking.,    Pulmonary exam normal        Cardiovascular hypertension, Pt. on medications Normal cardiovascular exam   '22 TTE - EF 60 to 65%. Severe concentric left ventricular hypertrophy. A large pericardial effusion is present. The pericardial effusion is circumferential. There is no evidence of cardiac tamponade. Presence of pericardial fat pad.      Neuro/Psych Seizures -, Well Controlled,  CVA, Residual Symptoms negative psych ROS   GI/Hepatic GERD  Medicated and Controlled,(+)     substance abuse  alcohol use and cocaine use,   Endo/Other  Hypothyroidism   Renal/GU CRFRenal disease     Musculoskeletal negative musculoskeletal ROS (+)   Abdominal   Peds  Hematology negative hematology ROS (+)   Anesthesia Other Findings   Reproductive/Obstetrics                           Anesthesia Physical Anesthesia Plan  ASA: 3  Anesthesia Plan: General   Post-op Pain Management: Tylenol PO (pre-op)   Induction: Intravenous  PONV Risk Score and Plan: 3 and Treatment may vary due to age or medical condition, Ondansetron, Dexamethasone and Midazolam  Airway Management Planned: Double Lumen EBT  Additional Equipment: Arterial line  Intra-op Plan:   Post-operative Plan: Extubation in OR  Informed Consent: I have reviewed the patients History and Physical, chart, labs and discussed the procedure including the risks, benefits and alternatives for the proposed  anesthesia with the patient or authorized representative who has indicated his/her understanding and acceptance.     Dental advisory given  Plan Discussed with: CRNA and Anesthesiologist  Anesthesia Plan Comments: ( )      Anesthesia Quick Evaluation

## 2021-07-27 NOTE — Progress Notes (Signed)
PCP - [name], MD Cardiologist - Rozann Lesches, MD  PPM/ICD - denies Device Orders - n/a Rep Notified - n/a  Chest x-ray - 07/27/2021 EKG - 07/27/2021 Stress Test - denies ECHO - 07/09/2021 Cardiac Cath - 05/30/2021  Sleep Study - denies CPAP - n/a  Fasting Blood Sugar - n/a  Blood Thinner Instructions: n/a  Aspirin Instructions: Patient was instructed to continue to take Aspirin and do not take it the day of surgery.    Patient was instructed: As of today, STOP taking Aleve, Naproxen, Ibuprofen, Motrin, Advil, Goody's, BC's, all herbal medications, fish oil, and all vitamins.    ERAS Protcol - no  COVID TEST- yes, done in PAT on 07/27/2021   Anesthesia review: yes - cardiac history. The chart was reviewed by Ebony Hail, PA-C. BP 165/105 when patient came in PAT. When rechecked, BP 165/97. Patient denied any distress and he verbalized he had all his BP medication on this morning.   Patient denies shortness of breath, fever, cough and chest pain at PAT appointment   All instructions explained to the patient, with a verbal understanding of the material. Patient agrees to go over the instructions while at home for a better understanding. Patient also instructed to self quarantine after being tested for COVID-19. The opportunity to ask questions was provided.

## 2021-07-30 ENCOUNTER — Encounter (HOSPITAL_COMMUNITY)
Admission: RE | Disposition: A | Discharge status: Nursing Home (Includes ICF) | Payer: Self-pay | Source: Home / Self Care | Attending: Thoracic Surgery (Cardiothoracic Vascular Surgery)

## 2021-07-30 ENCOUNTER — Encounter (HOSPITAL_COMMUNITY): Payer: Self-pay | Admitting: Thoracic Surgery (Cardiothoracic Vascular Surgery)

## 2021-07-30 ENCOUNTER — Inpatient Hospital Stay (HOSPITAL_COMMUNITY)
Admission: RE | Admit: 2021-07-30 | Discharge: 2021-08-03 | DRG: 271 | Disposition: A | Discharge status: Nursing Home (Includes ICF) | Payer: Medicare Other | Attending: Thoracic Surgery (Cardiothoracic Vascular Surgery) | Admitting: Thoracic Surgery (Cardiothoracic Vascular Surgery)

## 2021-07-30 ENCOUNTER — Inpatient Hospital Stay (HOSPITAL_COMMUNITY): Payer: Medicare Other

## 2021-07-30 ENCOUNTER — Inpatient Hospital Stay (HOSPITAL_COMMUNITY): Payer: Medicare Other | Admitting: Vascular Surgery

## 2021-07-30 ENCOUNTER — Inpatient Hospital Stay (HOSPITAL_COMMUNITY): Payer: Medicare Other | Admitting: Certified Registered Nurse Anesthetist

## 2021-07-30 ENCOUNTER — Other Ambulatory Visit: Payer: Self-pay

## 2021-07-30 DIAGNOSIS — R569 Unspecified convulsions: Secondary | ICD-10-CM | POA: Diagnosis present

## 2021-07-30 DIAGNOSIS — I3139 Other pericardial effusion (noninflammatory): Principal | ICD-10-CM | POA: Diagnosis present

## 2021-07-30 DIAGNOSIS — Z09 Encounter for follow-up examination after completed treatment for conditions other than malignant neoplasm: Secondary | ICD-10-CM

## 2021-07-30 DIAGNOSIS — Z7982 Long term (current) use of aspirin: Secondary | ICD-10-CM

## 2021-07-30 DIAGNOSIS — E785 Hyperlipidemia, unspecified: Secondary | ICD-10-CM | POA: Diagnosis present

## 2021-07-30 DIAGNOSIS — Z20822 Contact with and (suspected) exposure to covid-19: Secondary | ICD-10-CM | POA: Diagnosis present

## 2021-07-30 DIAGNOSIS — I69354 Hemiplegia and hemiparesis following cerebral infarction affecting left non-dominant side: Secondary | ICD-10-CM | POA: Diagnosis not present

## 2021-07-30 DIAGNOSIS — R600 Localized edema: Secondary | ICD-10-CM | POA: Diagnosis present

## 2021-07-30 DIAGNOSIS — F17210 Nicotine dependence, cigarettes, uncomplicated: Secondary | ICD-10-CM | POA: Diagnosis present

## 2021-07-30 DIAGNOSIS — K219 Gastro-esophageal reflux disease without esophagitis: Secondary | ICD-10-CM | POA: Diagnosis present

## 2021-07-30 DIAGNOSIS — D630 Anemia in neoplastic disease: Secondary | ICD-10-CM | POA: Diagnosis present

## 2021-07-30 DIAGNOSIS — Z79890 Hormone replacement therapy (postmenopausal): Secondary | ICD-10-CM

## 2021-07-30 DIAGNOSIS — Z79899 Other long term (current) drug therapy: Secondary | ICD-10-CM | POA: Diagnosis not present

## 2021-07-30 DIAGNOSIS — I129 Hypertensive chronic kidney disease with stage 1 through stage 4 chronic kidney disease, or unspecified chronic kidney disease: Secondary | ICD-10-CM | POA: Diagnosis present

## 2021-07-30 DIAGNOSIS — I160 Hypertensive urgency: Secondary | ICD-10-CM | POA: Diagnosis not present

## 2021-07-30 DIAGNOSIS — Z880 Allergy status to penicillin: Secondary | ICD-10-CM | POA: Diagnosis not present

## 2021-07-30 DIAGNOSIS — F1410 Cocaine abuse, uncomplicated: Secondary | ICD-10-CM | POA: Diagnosis present

## 2021-07-30 DIAGNOSIS — N184 Chronic kidney disease, stage 4 (severe): Secondary | ICD-10-CM | POA: Diagnosis present

## 2021-07-30 DIAGNOSIS — I714 Abdominal aortic aneurysm, without rupture, unspecified: Secondary | ICD-10-CM | POA: Diagnosis present

## 2021-07-30 DIAGNOSIS — D631 Anemia in chronic kidney disease: Secondary | ICD-10-CM | POA: Diagnosis present

## 2021-07-30 DIAGNOSIS — I480 Paroxysmal atrial fibrillation: Secondary | ICD-10-CM | POA: Diagnosis present

## 2021-07-30 DIAGNOSIS — E039 Hypothyroidism, unspecified: Secondary | ICD-10-CM | POA: Diagnosis present

## 2021-07-30 DIAGNOSIS — Z8249 Family history of ischemic heart disease and other diseases of the circulatory system: Secondary | ICD-10-CM

## 2021-07-30 HISTORY — PX: XI ROBOTIC ASSISTED PERICARDIAL WINDOW: SHX6870

## 2021-07-30 SURGERY — CREATION, PERICARDIAL WINDOW, ROBOT-ASSISTED
Anesthesia: General | Laterality: Right

## 2021-07-30 MED ORDER — MORPHINE SULFATE (PF) 2 MG/ML IV SOLN: 2.0000 mg | INTRAVENOUS | Status: DC | PRN

## 2021-07-30 MED ORDER — LATANOPROST 0.005 % OP SOLN
1.0000 [drp] | Freq: Every day | OPHTHALMIC | Status: DC
  Administered 2021-07-30 – 2021-08-02 (×4): 1 [drp] via OPHTHALMIC
  Filled 2021-07-30: qty 2.5

## 2021-07-30 MED ORDER — OXYCODONE HCL 5 MG/5ML PO SOLN: 5.0000 mg | Freq: Once | ORAL | Status: DC | PRN

## 2021-07-30 MED ORDER — BUPIVACAINE LIPOSOME 1.3 % IJ SUSP
INTRAMUSCULAR | Status: AC
  Filled 2021-07-30: qty 20

## 2021-07-30 MED ORDER — OLOPATADINE HCL 0.1 % OP SOLN
1.0000 [drp] | Freq: Every day | OPHTHALMIC | Status: DC
  Administered 2021-07-30 – 2021-08-03 (×5): 1 [drp] via OPHTHALMIC
  Filled 2021-07-30: qty 5

## 2021-07-30 MED ORDER — OMEGA-3-ACID ETHYL ESTERS 1 G PO CAPS
1.0000 g | ORAL_CAPSULE | Freq: Every day | ORAL | Status: DC
  Administered 2021-07-30 – 2021-08-03 (×5): 1 g via ORAL
  Filled 2021-07-30 (×6): qty 1

## 2021-07-30 MED ORDER — SODIUM CHLORIDE (PF) 0.9 % IJ SOLN
INTRAMUSCULAR | Status: DC | PRN
  Administered 2021-07-30: 50 mL via INTRAVENOUS

## 2021-07-30 MED ORDER — VITAMIN D 25 MCG (1000 UNIT) PO TABS
1000.0000 [IU] | ORAL_TABLET | Freq: Every day | ORAL | Status: DC
  Administered 2021-07-30 – 2021-08-03 (×5): 1000 [IU] via ORAL
  Filled 2021-07-30 (×5): qty 1

## 2021-07-30 MED ORDER — PHENYTOIN SODIUM EXTENDED 100 MG PO CAPS
300.0000 mg | ORAL_CAPSULE | Freq: Every day | ORAL | Status: DC
  Administered 2021-07-30 – 2021-08-02 (×4): 300 mg via ORAL
  Filled 2021-07-30 (×5): qty 3

## 2021-07-30 MED ORDER — BUPIVACAINE LIPOSOME 1.3 % IJ SUSP
INTRAMUSCULAR | Status: DC | PRN
  Administered 2021-07-30: 12:00:00 100 mL

## 2021-07-30 MED ORDER — CALCITRIOL 0.25 MCG PO CAPS
0.2500 ug | ORAL_CAPSULE | ORAL | Status: DC
  Administered 2021-07-30 – 2021-08-03 (×3): 0.25 ug via ORAL
  Filled 2021-07-30 (×3): qty 1

## 2021-07-30 MED ORDER — AMLODIPINE BESYLATE 10 MG PO TABS
10.0000 mg | ORAL_TABLET | Freq: Every day | ORAL | Status: DC
  Administered 2021-07-30 – 2021-08-03 (×5): 10 mg via ORAL
  Filled 2021-07-30 (×5): qty 1

## 2021-07-30 MED ORDER — ONDANSETRON HCL 4 MG/2ML IJ SOLN: 4.0000 mg | Freq: Four times a day (QID) | INTRAMUSCULAR | Status: DC | PRN

## 2021-07-30 MED ORDER — ATORVASTATIN CALCIUM 40 MG PO TABS
40.0000 mg | ORAL_TABLET | Freq: Every day | ORAL | Status: DC
  Administered 2021-07-30 – 2021-08-03 (×5): 40 mg via ORAL
  Filled 2021-07-30 (×5): qty 1

## 2021-07-30 MED ORDER — VANCOMYCIN HCL IN DEXTROSE 1-5 GM/200ML-% IV SOLN
INTRAVENOUS | Status: AC
  Administered 2021-07-30: 09:00:00 1000 mg via INTRAVENOUS
  Filled 2021-07-30: qty 200

## 2021-07-30 MED ORDER — CHLORHEXIDINE GLUCONATE 0.12 % MT SOLN: 15.0000 mL | Freq: Once | OROMUCOSAL | Status: AC

## 2021-07-30 MED ORDER — LABETALOL HCL 5 MG/ML IV SOLN
INTRAVENOUS | Status: AC
  Administered 2021-07-30: 14:00:00 5 mg via INTRAVENOUS
  Filled 2021-07-30: qty 4

## 2021-07-30 MED ORDER — VITAMIN B-12 1000 MCG PO TABS
1000.0000 ug | ORAL_TABLET | Freq: Every day | ORAL | Status: DC
  Administered 2021-07-30 – 2021-08-03 (×5): 1000 ug via ORAL
  Filled 2021-07-30 (×5): qty 1

## 2021-07-30 MED ORDER — ROCURONIUM BROMIDE 10 MG/ML (PF) SYRINGE
PREFILLED_SYRINGE | INTRAVENOUS | Status: AC
  Filled 2021-07-30: qty 10

## 2021-07-30 MED ORDER — PROPOFOL 10 MG/ML IV BOLUS
INTRAVENOUS | Status: DC | PRN
  Administered 2021-07-30: 40 mg via INTRAVENOUS
  Administered 2021-07-30: 80 mg via INTRAVENOUS

## 2021-07-30 MED ORDER — PHENYLEPHRINE HCL-NACL 20-0.9 MG/250ML-% IV SOLN
INTRAVENOUS | Status: DC | PRN
  Administered 2021-07-30: 25 ug/min via INTRAVENOUS

## 2021-07-30 MED ORDER — SODIUM CHLORIDE 0.9 % IV SOLN: INTRAVENOUS | Status: DC | PRN

## 2021-07-30 MED ORDER — BISACODYL 5 MG PO TBEC
10.0000 mg | DELAYED_RELEASE_TABLET | Freq: Every day | ORAL | Status: DC
  Administered 2021-07-30 – 2021-08-03 (×5): 10 mg via ORAL
  Filled 2021-07-30 (×5): qty 2

## 2021-07-30 MED ORDER — DORZOLAMIDE HCL-TIMOLOL MAL 2-0.5 % OP SOLN
1.0000 [drp] | Freq: Two times a day (BID) | OPHTHALMIC | Status: DC
  Administered 2021-07-30 – 2021-08-03 (×8): 1 [drp] via OPHTHALMIC
  Filled 2021-07-30: qty 10

## 2021-07-30 MED ORDER — ONDANSETRON HCL 4 MG/2ML IJ SOLN
INTRAMUSCULAR | Status: AC
  Filled 2021-07-30: qty 2

## 2021-07-30 MED ORDER — VANCOMYCIN HCL 1000 MG/200ML IV SOLN
1000.0000 mg | Freq: Two times a day (BID) | INTRAVENOUS | Status: AC
  Administered 2021-07-30: 18:00:00 1000 mg via INTRAVENOUS
  Filled 2021-07-30: qty 200

## 2021-07-30 MED ORDER — FENTANYL CITRATE (PF) 250 MCG/5ML IJ SOLN
INTRAMUSCULAR | Status: AC
  Filled 2021-07-30: qty 5

## 2021-07-30 MED ORDER — SENNOSIDES-DOCUSATE SODIUM 8.6-50 MG PO TABS
1.0000 | ORAL_TABLET | Freq: Every day | ORAL | Status: DC
  Administered 2021-07-30 – 2021-08-02 (×4): 1 via ORAL
  Filled 2021-07-30 (×4): qty 1

## 2021-07-30 MED ORDER — OXYCODONE HCL 5 MG PO TABS: 5.0000 mg | ORAL_TABLET | Freq: Once | ORAL | Status: DC | PRN

## 2021-07-30 MED ORDER — VANCOMYCIN HCL IN DEXTROSE 1-5 GM/200ML-% IV SOLN: 1000.0000 mg | INTRAVENOUS | Status: AC

## 2021-07-30 MED ORDER — FENTANYL CITRATE (PF) 250 MCG/5ML IJ SOLN
INTRAMUSCULAR | Status: DC | PRN
  Administered 2021-07-30 (×2): 50 ug via INTRAVENOUS

## 2021-07-30 MED ORDER — LEVOTHYROXINE SODIUM 25 MCG PO TABS
125.0000 ug | ORAL_TABLET | Freq: Every day | ORAL | Status: DC
  Administered 2021-07-31 – 2021-08-03 (×4): 125 ug via ORAL
  Filled 2021-07-30 (×4): qty 1

## 2021-07-30 MED ORDER — LABETALOL HCL 200 MG PO TABS
200.0000 mg | ORAL_TABLET | ORAL | Status: AC
  Administered 2021-07-30: 09:00:00 200 mg via ORAL
  Filled 2021-07-30: qty 1

## 2021-07-30 MED ORDER — ACETAMINOPHEN 500 MG PO TABS
ORAL_TABLET | ORAL | Status: AC
  Filled 2021-07-30: qty 2

## 2021-07-30 MED ORDER — LABETALOL HCL 5 MG/ML IV SOLN
5.0000 mg | INTRAVENOUS | Status: AC | PRN
  Administered 2021-07-30 (×3): 5 mg via INTRAVENOUS

## 2021-07-30 MED ORDER — PROPOFOL 10 MG/ML IV BOLUS
INTRAVENOUS | Status: AC
  Filled 2021-07-30: qty 20

## 2021-07-30 MED ORDER — DEXAMETHASONE SODIUM PHOSPHATE 10 MG/ML IJ SOLN
INTRAMUSCULAR | Status: DC | PRN
  Administered 2021-07-30: 5 mg via INTRAVENOUS

## 2021-07-30 MED ORDER — ONDANSETRON HCL 4 MG/2ML IJ SOLN: 4.0000 mg | Freq: Once | INTRAMUSCULAR | Status: DC | PRN

## 2021-07-30 MED ORDER — SUGAMMADEX SODIUM 200 MG/2ML IV SOLN
INTRAVENOUS | Status: DC | PRN
  Administered 2021-07-30: 200 mg via INTRAVENOUS

## 2021-07-30 MED ORDER — ACETAMINOPHEN 500 MG PO TABS
1000.0000 mg | ORAL_TABLET | Freq: Four times a day (QID) | ORAL | Status: DC
  Administered 2021-07-30 – 2021-08-03 (×11): 1000 mg via ORAL
  Filled 2021-07-30 (×13): qty 2

## 2021-07-30 MED ORDER — ENOXAPARIN SODIUM 40 MG/0.4ML IJ SOSY
40.0000 mg | PREFILLED_SYRINGE | Freq: Every day | INTRAMUSCULAR | Status: DC
  Administered 2021-07-31 – 2021-08-01 (×2): 40 mg via SUBCUTANEOUS
  Filled 2021-07-30 (×2): qty 0.4

## 2021-07-30 MED ORDER — PANTOPRAZOLE SODIUM 40 MG PO TBEC
40.0000 mg | DELAYED_RELEASE_TABLET | Freq: Every day | ORAL | Status: DC
  Administered 2021-07-30 – 2021-08-03 (×5): 40 mg via ORAL
  Filled 2021-07-30 (×5): qty 1

## 2021-07-30 MED ORDER — SODIUM ZIRCONIUM CYCLOSILICATE 5 G PO PACK
5.0000 g | PACK | ORAL | Status: DC
  Administered 2021-07-30 – 2021-08-02 (×2): 5 g via ORAL
  Filled 2021-07-30 (×2): qty 1

## 2021-07-30 MED ORDER — MIDAZOLAM HCL 2 MG/2ML IJ SOLN
INTRAMUSCULAR | Status: DC | PRN
Start: 2021-07-30 — End: 2021-07-30
  Administered 2021-07-30: 1 mg via INTRAVENOUS

## 2021-07-30 MED ORDER — SODIUM BICARBONATE 650 MG PO TABS
650.0000 mg | ORAL_TABLET | Freq: Two times a day (BID) | ORAL | Status: DC
  Administered 2021-07-30 – 2021-08-03 (×8): 650 mg via ORAL
  Filled 2021-07-30 (×8): qty 1

## 2021-07-30 MED ORDER — FUROSEMIDE 40 MG PO TABS
40.0000 mg | ORAL_TABLET | ORAL | Status: DC
  Administered 2021-08-01 – 2021-08-03 (×2): 40 mg via ORAL
  Filled 2021-07-30 (×2): qty 1

## 2021-07-30 MED ORDER — 0.9 % SODIUM CHLORIDE (POUR BTL) OPTIME
TOPICAL | Status: DC | PRN
  Administered 2021-07-30 (×2): 1000 mL

## 2021-07-30 MED ORDER — TRAMADOL HCL 50 MG PO TABS: 50.0000 mg | ORAL_TABLET | Freq: Four times a day (QID) | ORAL | Status: DC | PRN

## 2021-07-30 MED ORDER — ORAL CARE MOUTH RINSE: 15.0000 mL | Freq: Once | OROMUCOSAL | Status: AC

## 2021-07-30 MED ORDER — ROCURONIUM BROMIDE 10 MG/ML (PF) SYRINGE
PREFILLED_SYRINGE | INTRAVENOUS | Status: DC | PRN
  Administered 2021-07-30: 40 mg via INTRAVENOUS
  Administered 2021-07-30: 60 mg via INTRAVENOUS

## 2021-07-30 MED ORDER — BUPIVACAINE HCL (PF) 0.5 % IJ SOLN
INTRAMUSCULAR | Status: AC
  Filled 2021-07-30: qty 30

## 2021-07-30 MED ORDER — LIDOCAINE 2% (20 MG/ML) 5 ML SYRINGE
INTRAMUSCULAR | Status: DC | PRN
  Administered 2021-07-30: 80 mg via INTRAVENOUS

## 2021-07-30 MED ORDER — CHLORHEXIDINE GLUCONATE 0.12 % MT SOLN
OROMUCOSAL | Status: AC
  Administered 2021-07-30: 09:00:00 15 mL via OROMUCOSAL
  Filled 2021-07-30: qty 15

## 2021-07-30 MED ORDER — FENTANYL CITRATE (PF) 100 MCG/2ML IJ SOLN: 25.0000 ug | INTRAMUSCULAR | Status: DC | PRN

## 2021-07-30 MED ORDER — ACETAMINOPHEN 160 MG/5ML PO SOLN: 1000.0000 mg | Freq: Four times a day (QID) | ORAL | Status: DC

## 2021-07-30 MED ORDER — MIDAZOLAM HCL 2 MG/2ML IJ SOLN
INTRAMUSCULAR | Status: AC
  Filled 2021-07-30: qty 2

## 2021-07-30 MED ORDER — ONDANSETRON HCL 4 MG/2ML IJ SOLN
INTRAMUSCULAR | Status: DC | PRN
  Administered 2021-07-30: 4 mg via INTRAVENOUS

## 2021-07-30 MED ORDER — LIDOCAINE 2% (20 MG/ML) 5 ML SYRINGE
INTRAMUSCULAR | Status: AC
  Filled 2021-07-30: qty 5

## 2021-07-30 MED ORDER — SODIUM CHLORIDE 0.9 % IV SOLN: INTRAVENOUS | Status: DC

## 2021-07-30 MED ORDER — LABETALOL HCL 200 MG PO TABS
200.0000 mg | ORAL_TABLET | Freq: Two times a day (BID) | ORAL | Status: DC
  Administered 2021-07-30 – 2021-08-01 (×5): 200 mg via ORAL
  Filled 2021-07-30 (×6): qty 1

## 2021-07-30 MED ORDER — ASPIRIN 81 MG PO CHEW
81.0000 mg | CHEWABLE_TABLET | Freq: Every day | ORAL | Status: DC
  Administered 2021-07-31 – 2021-08-03 (×4): 81 mg via ORAL
  Filled 2021-07-30 (×4): qty 1

## 2021-07-30 MED ORDER — ACETAMINOPHEN 500 MG PO TABS
1000.0000 mg | ORAL_TABLET | Freq: Once | ORAL | Status: AC
  Administered 2021-07-30: 09:00:00 1000 mg via ORAL

## 2021-07-30 SURGICAL SUPPLY — 60 items
BLADE STERNUM SYSTEM 6 (BLADE) IMPLANT
CNTNR URN SCR LID CUP LEK RST (MISCELLANEOUS) ×1 IMPLANT
CONNECTOR BLAKE 2:1 CARIO BLK (MISCELLANEOUS) IMPLANT
CONT SPEC 4OZ STRL OR WHT (MISCELLANEOUS) ×2
DEFOGGER SCOPE WARMER CLEARIFY (MISCELLANEOUS) ×2 IMPLANT
DERMABOND ADVANCED (GAUZE/BANDAGES/DRESSINGS) ×1
DERMABOND ADVANCED .7 DNX12 (GAUZE/BANDAGES/DRESSINGS) ×1 IMPLANT
DRAIN CHANNEL 19F RND (DRAIN) ×4 IMPLANT
DRAIN CONNECTOR BLAKE 1:1 (MISCELLANEOUS) ×2 IMPLANT
DRAPE ARM DVNC X/XI (DISPOSABLE) ×4 IMPLANT
DRAPE COLUMN DVNC XI (DISPOSABLE) ×1 IMPLANT
DRAPE CV SPLIT W-CLR ANES SCRN (DRAPES) ×2 IMPLANT
DRAPE DA VINCI XI ARM (DISPOSABLE) ×8
DRAPE DA VINCI XI COLUMN (DISPOSABLE) ×2
DRAPE ORTHO SPLIT 77X108 STRL (DRAPES) ×2
DRAPE SURG ORHT 6 SPLT 77X108 (DRAPES) ×1 IMPLANT
DRSG AQUACEL AG ADV 3.5X14 (GAUZE/BANDAGES/DRESSINGS) IMPLANT
ELECT REM PT RETURN 9FT ADLT (ELECTROSURGICAL) ×2
ELECTRODE REM PT RTRN 9FT ADLT (ELECTROSURGICAL) ×1 IMPLANT
EVACUATOR SILICONE 100CC (DRAIN) ×2 IMPLANT
FELT TEFLON 1X6 (MISCELLANEOUS) IMPLANT
GAUZE SPONGE 4X4 12PLY STRL (GAUZE/BANDAGES/DRESSINGS) ×2 IMPLANT
GLOVE SURG ENC MOIS LTX SZ7 (GLOVE) ×4 IMPLANT
GLOVE SURG MICRO LTX SZ7.5 (GLOVE) ×10 IMPLANT
GOWN STRL REUS W/ TWL LRG LVL3 (GOWN DISPOSABLE) ×2 IMPLANT
GOWN STRL REUS W/ TWL XL LVL3 (GOWN DISPOSABLE) ×1 IMPLANT
GOWN STRL REUS W/TWL LRG LVL3 (GOWN DISPOSABLE) ×4
GOWN STRL REUS W/TWL XL LVL3 (GOWN DISPOSABLE) ×2
HEMOSTAT POWDER SURGIFOAM 1G (HEMOSTASIS) IMPLANT
KIT SUCTION CATH 14FR (SUCTIONS) IMPLANT
NEEDLE HYPO 25GX1X1/2 BEV (NEEDLE) IMPLANT
PACK CHEST (CUSTOM PROCEDURE TRAY) ×2 IMPLANT
PAD ARMBOARD 7.5X6 YLW CONV (MISCELLANEOUS) ×4 IMPLANT
PAD ELECT DEFIB RADIOL ZOLL (MISCELLANEOUS) ×2 IMPLANT
SEAL CANN UNIV 5-8 DVNC XI (MISCELLANEOUS) ×3 IMPLANT
SEAL XI 5MM-8MM UNIVERSAL (MISCELLANEOUS) ×6
SET TRI-LUMEN FLTR TB AIRSEAL (TUBING) ×4 IMPLANT
STOPCOCK 4 WAY LG BORE MALE ST (IV SETS) ×2 IMPLANT
SUT BONE WAX W31G (SUTURE) IMPLANT
SUT MNCRL AB 3-0 PS2 18 (SUTURE) IMPLANT
SUT PDS AB 1 CTX 36 (SUTURE) IMPLANT
SUT SILK  1 MH (SUTURE) ×2
SUT SILK 1 MH (SUTURE) ×1 IMPLANT
SUT SILK 2 0 SH CR/8 (SUTURE) IMPLANT
SUT STEEL 6MS V (SUTURE) IMPLANT
SUT STEEL SZ 6 DBL 3X14 BALL (SUTURE) IMPLANT
SUT VIC AB 2-0 CTX 36 (SUTURE) IMPLANT
SUT VIC AB 3-0 SH 27 (SUTURE) ×2
SUT VIC AB 3-0 SH 27X BRD (SUTURE) ×1 IMPLANT
SUT VICRYL 0 UR6 27IN ABS (SUTURE) ×4 IMPLANT
SYR 10ML LL (SYRINGE) ×2 IMPLANT
SYR 20ML LL LF (SYRINGE) ×2 IMPLANT
SYR 50ML LL SCALE MARK (SYRINGE) IMPLANT
SYSTEM RETRIEVAL ANCHOR 8 (MISCELLANEOUS) IMPLANT
SYSTEM SAHARA CHEST DRAIN ATS (WOUND CARE) ×2 IMPLANT
TAPE CLOTH SURG 4X10 WHT LF (GAUZE/BANDAGES/DRESSINGS) ×2 IMPLANT
TOWEL GREEN STERILE (TOWEL DISPOSABLE) ×2 IMPLANT
TRAP SPECIMEN MUCUS 40CC (MISCELLANEOUS) ×6 IMPLANT
TRAY FOLEY SLVR 16FR TEMP STAT (SET/KITS/TRAYS/PACK) ×2 IMPLANT
TUBING EXTENTION W/L.L. (IV SETS) ×2 IMPLANT

## 2021-07-30 NOTE — Anesthesia Procedure Notes (Addendum)
  Arterial Line Insertion Start/End11/28/2022 10:18 AM, 07/30/2021 10:22 AM Performed by: Audry Pili, MD, anesthesiologist  Patient location: Pre-op. Preanesthetic checklist: patient identified, IV checked, risks and benefits discussed, surgical consent, monitors and equipment checked, pre-op evaluation, timeout performed and anesthesia consent Lidocaine 1% used for infiltration and patient sedated Right, radial was placed Catheter size: 20 G Hand hygiene performed   Attempts: 2 (Previous attempts on left radial by CRNA unsuccessful) Procedure performed using ultrasound guided technique. Ultrasound Notes:anatomy identified, needle tip was noted to be adjacent to the nerve/plexus identified, no ultrasound evidence of intravascular and/or intraneural injection and image(s) printed for medical record Following insertion, dressing applied and Biopatch. Post procedure assessment: unchanged and normal  Post procedure complications: second provider assisted. Patient tolerated the procedure well with no immediate complications.

## 2021-07-30 NOTE — Op Note (Signed)
      [address]       [place] [address]             [phone]        07/30/2021  Patient:  Kurt Nelson Pre-Op Dx: Recurrent pericardial effusion Post-op Dx: Same Procedure: - Robotic assisted right video thoracoscopy - Pericardial window - Intercostal nerve block  Surgeon and Role:      * Lajuana Matte, MD - Primary    Evonnie Pat, PA-C- assisting with bedside surgery.  Anesthesia  general EBL: 10 ml Blood Administration: None Specimen: Pericardial fluid.  Pericardium  Drains: 68 F x2 chest tube in right chest, and pericardium Counts: correct   Indications: 65 year old male with a recurrence circumferential pericardial effusion.  He is already undergone pericardiocentesis, but developed rapid reaccumulation over the course of 2 months.  At the original pericardiocentesis over 2 L of fluid was removed.  He had a very large stroke 10 years ago and has significant residual deficits on the left side.  He does require a walker for ambulation.  He is currently living in a nursing home.  Surprisingly he does not complain of much shortness of breath.  His lower extremity swelling is also improved.  We discussed the risks and benefits of a robotic assisted right pericardial window.  He is agreeable to proceed with this plan.    Findings: Floppy pericardium.  Serous pericardial fluid.  A 3 x 3 pericardial window was created on the right.  We also created an opening in the pericardium to drain into the left chest.  Operative Technique: After the risks, benefits and alternatives were thoroughly discussed, the patient was brought to the operative theatre.  Anesthesia was induced, and the patient was then placed in a right lazy lateral decubitus position and was prepped and draped in normal sterile fashion.  An appropriate surgical pause was performed, and pre-operative antibiotics were dosed accordingly.  We began by placing our 3 robotic ports in the the  intercostal spaces targeting the pericardium.  The robot was then docked and all instruments were passed under direct visualization.    The pericardium was visualized, and a 3 x 3 centimeters pericardial window was created using Bovie cautery.  The pericardium was then and floppy.  Immediate release of serous pericardial fluid was released.  The fluid and pericardium were sent for specimen.  We also created an opening in the pericardium posterior to the phrenic nerve to assist with drainage.  Given the size of the pericardium I wanted to ensure that he had adequate drainage thus I drew with a camera and the instruments into the pericardium and created another 3 x 3 cm window to drain into the left chest.  All instruments were removed and the robot was undocked A 19 [place] Blake drain was passed through right inferior robotic port into the pericardium.  Another 25 [place] Keenan Bachelor was then passed through our camera port into the pleural space.  An intercostal nerve block was performed under direct visualization.  The skin and soft tissue were closed with absorbable suture    The patient tolerated the procedure without any immediate complications, and was transferred to the PACU in stable condition.  Kurt Nelson

## 2021-07-30 NOTE — Interval H&P Note (Signed)
History and Physical Interval Note:  07/30/2021 11:12 AM  Kurt Nelson  has presented today for surgery, with the diagnosis of PERICARDIAL EFFUSION.  The various methods of treatment have been discussed with the patient and family. After consideration of risks, benefits and other options for treatment, the patient has consented to  Procedure(s): XI ROBOTIC [place] (Right) as a surgical intervention.  The patient's history has been reviewed, patient examined, no change in status, stable for surgery.  I have reviewed the patient's chart and labs.  Questions were answered to the patient's satisfaction.     [name]

## 2021-07-30 NOTE — Transfer of Care (Signed)
Immediate Anesthesia Transfer of Care Note  Patient: Kurt Nelson  Procedure(s) Performed: XI ROBOTIC ASSISTED THORACOSCOPY PERICARDIAL WINDOW (Right)  Patient Location: PACU  Anesthesia Type:General  Level of Consciousness: awake and drowsy  Airway & Oxygen Therapy: Patient Spontanous Breathing and Patient connected to face mask oxygen  Post-op Assessment: Report given to RN, Post -op Vital signs reviewed and stable and Patient moving all extremities X 4  Post vital signs: Reviewed and stable  Last Vitals:  Vitals Value Taken Time  BP    Temp    Pulse 72 07/30/21 1323  Resp    SpO2 100 % 07/30/21 1323  Vitals shown include unvalidated device data.  Last Pain:  Vitals:   07/30/21 0845  TempSrc:   PainSc: 0-No pain         Complications: No notable events documented.

## 2021-07-30 NOTE — Anesthesia Procedure Notes (Signed)
Procedure Name: Intubation Date/Time: 07/30/2021 11:58 AM Performed by: Harden Mo, CRNA Pre-anesthesia Checklist: Patient identified, Emergency Drugs available, Suction available and Patient being monitored Patient Re-evaluated:Patient Re-evaluated prior to induction Oxygen Delivery Method: Circle System Utilized Preoxygenation: Pre-oxygenation with 100% oxygen Induction Type: IV induction Ventilation: Mask ventilation without difficulty Laryngoscope Size: Mac and 3 Grade View: Grade I Endobronchial tube: Left, Double lumen EBT, EBT position confirmed by auscultation and EBT position confirmed by fiberoptic bronchoscope and 37 Fr Number of attempts: 1 Airway Equipment and Method: Stylet and Oral airway Placement Confirmation: ETT inserted through vocal cords under direct vision, positive ETCO2 and breath sounds checked- equal and bilateral Tube secured with: Tape Dental Injury: Teeth and Oropharynx as per pre-operative assessment  Comments: Intubation by Tally Joe

## 2021-07-30 NOTE — Anesthesia Postprocedure Evaluation (Signed)
Anesthesia Post Note  Patient: Kurt Nelson  Procedure(s) Performed: XI ROBOTIC ASSISTED THORACOSCOPY PERICARDIAL WINDOW (Right)     Patient location during evaluation: PACU Anesthesia Type: General Level of consciousness: awake and alert Pain management: pain level controlled Vital Signs Assessment: post-procedure vital signs reviewed and stable Respiratory status: spontaneous breathing, nonlabored ventilation and respiratory function stable Cardiovascular status: stable and blood pressure returned to baseline Anesthetic complications: no   No notable events documented.  Last Vitals:  Vitals:   07/30/21 1408 07/30/21 1422  BP: (!) 178/137 (!) 164/104  Pulse: 69 71  Resp: 19 (!) 25  Temp:    SpO2: 93% 95%    Last Pain:  Vitals:   07/30/21 1422  TempSrc:   PainSc: 0-No pain                 Audry Pili

## 2021-07-30 NOTE — Brief Op Note (Signed)
07/30/2021  12:49 PM  PATIENT:  Isabella Stalling  65 y.o. male  PRE-OPERATIVE DIAGNOSIS:  PERICARDIAL EFFUSION  POST-OPERATIVE DIAGNOSIS:  PERICARDIAL EFFUSION  PROCEDURE:  Procedure(s): XI ROBOTIC ASSISTED THORACOSCOPY PERICARDIAL WINDOW (Right)  SURGEON:  Surgeon(s) and Role:    * Lightfoot, Lucile Crater, MD - Primary  PHYSICIAN ASSISTANT: Britain Saber PA-C  ASSISTANTS: none   ANESTHESIA:   general  EBL:  10 CC   BLOOD ADMINISTERED:none  DRAINS: (19 f) Blake drain(s) in the [place]:  Source of Specimen:  PERICARDIUM  DISPOSITION OF SPECIMEN:  PATHOLOGY  COUNTS:  YES  TOURNIQUET:  * No tourniquets in log *  DICTATION: .Dragon Dictation  PLAN OF CARE: Admit to inpatient   PATIENT DISPOSITION:  PACU - hemodynamically stable.   Delay start of Pharmacological VTE agent (>24hrs) due to surgical blood loss or risk of bleeding: yes  COMPLICATIONS: NO KNOWN

## 2021-07-30 NOTE — Progress Notes (Signed)
Spoke to Dr. Fransisco Beau regarding elevated BP.  Administered home dose of Labetalol 200mg .  Dr. Fransisco Beau did not place any orders.  Will continue to monitor

## 2021-07-31 ENCOUNTER — Inpatient Hospital Stay (HOSPITAL_COMMUNITY): Payer: Medicare Other

## 2021-07-31 ENCOUNTER — Encounter (HOSPITAL_COMMUNITY): Payer: Self-pay | Admitting: Thoracic Surgery (Cardiothoracic Vascular Surgery)

## 2021-07-31 LAB — CBC
HCT: 37.3 % — ABNORMAL LOW (ref 39.0–52.0)
Hemoglobin: 12 g/dL — ABNORMAL LOW (ref 13.0–17.0)
MCH: 27.6 pg (ref 26.0–34.0)
MCHC: 32.2 g/dL (ref 30.0–36.0)
MCV: 85.9 fL (ref 80.0–100.0)
Platelets: 174 10*3/uL (ref 150–400)
RBC: 4.34 MIL/uL (ref 4.22–5.81)
RDW: 12.6 % (ref 11.5–15.5)
WBC: 9.9 10*3/uL (ref 4.0–10.5)
nRBC: 0 % (ref 0.0–0.2)

## 2021-07-31 LAB — BASIC METABOLIC PANEL
Anion gap: 6 (ref 5–15)
BUN: 30 mg/dL — ABNORMAL HIGH (ref 8–23)
CO2: 22 mmol/L (ref 22–32)
Calcium: 9 mg/dL (ref 8.9–10.3)
Chloride: 109 mmol/L (ref 98–111)
Creatinine, Ser: 1.97 mg/dL — ABNORMAL HIGH (ref 0.61–1.24)
GFR, Estimated: 37 mL/min — ABNORMAL LOW (ref >60)
Glucose, Bld: 120 mg/dL — ABNORMAL HIGH (ref 70–99)
Potassium: 4.7 mmol/L (ref 3.5–5.1)
Sodium: 137 mmol/L (ref 135–145)

## 2021-07-31 LAB — CYTOLOGY - NON PAP

## 2021-07-31 MED ORDER — LABETALOL HCL 5 MG/ML IV SOLN
INTRAVENOUS | Status: AC
  Filled 2021-07-31: qty 4

## 2021-07-31 MED ORDER — LABETALOL HCL 5 MG/ML IV SOLN
10.0000 mg | INTRAVENOUS | Status: DC | PRN
  Administered 2021-07-31 – 2021-08-01 (×5): 10 mg via INTRAVENOUS
  Filled 2021-07-31 (×3): qty 4

## 2021-07-31 NOTE — TOC Progression Note (Signed)
Transition of Care [facility]) - Progression Note    Patient Details  Name: Kurt Nelson MRN: [id number] Date of Birth: 11/23/1955  [facility]) CM/SW Contact  Zenon Mayo, RN Phone Number: 07/31/2021, 4:03 PM  Clinical Narrative:    from [facility] s/p pericardial window yest, has pig tail chest tube and bulb drain. TOC will continue to follow for dc needs.        Expected Discharge Plan and Services                                                 Social Determinants of Health (SDOH) Interventions    Readmission Risk Interventions No flowsheet data found.

## 2021-07-31 NOTE — Progress Notes (Addendum)
[address]       [place] [address]             [phone]      1 Day Post-Op Procedure(s) (LRB): XI ROBOTIC ASSISTED THORACOSCOPY PERICARDIAL WINDOW (Right) Subjective: No pain or SOB  Objective: Vital signs in last 24 hours: Temp:  [97 F (36.1 C)-98.3 F (36.8 C)] 98.2 F (36.8 C) (11/29 0312) Pulse Rate:  [61-85] 75 (11/29 0312) Cardiac Rhythm: Normal sinus rhythm (11/28 1909) Resp:  [16-25] 19 (11/29 0312) BP: (149-205)/(92-137) 162/95 (11/29 0312) SpO2:  [91 %-100 %] 97 % (11/29 0312) Arterial Line BP: (168-195)/(76-98) 195/94 (11/28 1523) Weight:  [57.6 kg] 57.6 kg (11/28 0835)  Hemodynamic parameters for last 24 hours:    Intake/Output from previous day: 11/28 0701 - 11/29 0700 In: 1300 [P.O.:480; I.V.:820] Out: 2435 [Urine:1450; Drains:195; Blood:20; Chest Tube:20] Intake/Output this shift: No intake/output data recorded.  General appearance: alert, cooperative, distracted, and no distress Heart: regular rate and rhythm Lungs: some upper airway congestion, improves with cough Abdomen: benign Extremities: no edema Wound: incis healing well  Lab Results: Recent Labs    07/31/21 0325  WBC 9.9  HGB 12.0*  HCT 37.3*  PLT 174   BMET:  Recent Labs    07/31/21 0325  NA 137  K 4.7  CL 109  CO2 22  GLUCOSE 120*  BUN 30*  CREATININE 1.97*  CALCIUM 9.0    PT/INR: No results for input(s): LABPROT, INR in the last 72 hours. ABG    Component Value Date/Time   PHART 7.334 (L) 07/27/2021 1225   HCO3 20.5 07/27/2021 1225   TCO2 12.7 09/24/2007 1200   ACIDBASEDEF 4.3 (H) 07/27/2021 1225   O2SAT 97.5 07/27/2021 1225   CBG (last 3)  No results for input(s): GLUCAP in the last 72 hours.  Meds Scheduled Meds:  acetaminophen  1,000 mg Oral Q6H   Or   acetaminophen (TYLENOL) oral liquid 160 mg/5 mL  1,000 mg Oral Q6H   amLODipine  10 mg Oral Daily   aspirin  81 mg Oral Daily   atorvastatin  40 mg Oral Daily   bisacodyl   10 mg Oral Daily   calcitRIOL  0.25 mcg Oral Q M,W,F   cholecalciferol  1,000 Units Oral Daily   dorzolamide-timolol  1 drop Left Eye BID   enoxaparin (LOVENOX) injection  40 mg Subcutaneous Daily   [START ON 08/01/2021] furosemide  40 mg Oral Once per day on Wed Fri   labetalol  200 mg Oral BID   latanoprost  1 drop Both Eyes QHS   levothyroxine  125 mcg Oral QAC breakfast   olopatadine  1 drop Both Eyes Daily   omega-3 acid ethyl esters  1 g Oral Daily   pantoprazole  40 mg Oral Daily   phenytoin  300 mg Oral QHS   senna-docusate  1 tablet Oral QHS   sodium bicarbonate  650 mg Oral BID   sodium zirconium cyclosilicate  5 g Oral Once per day on Mon Thu   cyanocobalamin  1,000 mcg Oral Daily   Continuous Infusions: PRN Meds:.morphine injection, ondansetron (ZOFRAN) IV, traMADol  Xrays DG Chest Port 1 View  Result Date: 07/30/2021 CLINICAL DATA:  Pericardial effusion EXAM: PORTABLE CHEST 1 VIEW COMPARISON:  07/27/2021 FINDINGS: Mediastinal drain is in place. Small amount of pericardial air can be seen. The cardiac silhouette is smaller than on the previous study. There is mild bibasilar atelectasis. As seen  previously, a small BB overlies the heart in the region of the left ventricle. IMPRESSION: Interval placement of pericardial drainage catheter. Cardiac silhouette is smaller. Small amount of pericardial air. Mild bibasilar atelectasis persists. Electronically Signed   By: Nelson Chimes M.D.   On: 07/30/2021 14:50    Assessment/Plan: S/P Procedure(s) (LRB): XI ROBOTIC ASSISTED THORACOSCOPY PERICARDIAL WINDOW (Right)  1 afeb, + hypertensive, back on norvasc, labetolol- home meds, monitor. Sinus rhythm 2 sats good on RA 3 CXR - some vasc congestion, minor atx- cont home Wed/Fri dosing of furosemide 4 CKD- creat 1.97, previous on 11/25 was 2.50- making good urine 5 H/H very stable, no intraop blood loss 6 drain 195 cc- keep for now 7 d/c art line 8 mobilize/pulm hygiene 9 will get  TOC to work on return to facility    LOS: 1 day    [name] PA-C Pager [phone] 07/31/2021    Agree with above. Continue pulmonary toilet Dispo planning  [place]

## 2021-07-31 NOTE — Plan of Care (Signed)

## 2021-08-01 ENCOUNTER — Inpatient Hospital Stay (HOSPITAL_COMMUNITY): Payer: Medicare Other

## 2021-08-01 DIAGNOSIS — I160 Hypertensive urgency: Secondary | ICD-10-CM | POA: Diagnosis not present

## 2021-08-01 DIAGNOSIS — I3139 Other pericardial effusion (noninflammatory): Secondary | ICD-10-CM | POA: Diagnosis not present

## 2021-08-01 LAB — COMPREHENSIVE METABOLIC PANEL
ALT: 20 U/L (ref 0–44)
AST: 18 U/L (ref 15–41)
Albumin: 3.1 g/dL — ABNORMAL LOW (ref 3.5–5.0)
Alkaline Phosphatase: 166 U/L — ABNORMAL HIGH (ref 38–126)
Anion gap: 7 (ref 5–15)
BUN: 32 mg/dL — ABNORMAL HIGH (ref 8–23)
CO2: 23 mmol/L (ref 22–32)
Calcium: 9.2 mg/dL (ref 8.9–10.3)
Chloride: 108 mmol/L (ref 98–111)
Creatinine, Ser: 2.47 mg/dL — ABNORMAL HIGH (ref 0.61–1.24)
GFR, Estimated: 28 mL/min — ABNORMAL LOW (ref >60)
Glucose, Bld: 120 mg/dL — ABNORMAL HIGH (ref 70–99)
Potassium: 4.6 mmol/L (ref 3.5–5.1)
Sodium: 138 mmol/L (ref 135–145)
Total Bilirubin: 0.6 mg/dL (ref 0.3–1.2)
Total Protein: 7 g/dL (ref 6.5–8.1)

## 2021-08-01 LAB — CBC
HCT: 40.7 % (ref 39.0–52.0)
Hemoglobin: 13.2 g/dL (ref 13.0–17.0)
MCH: 28 pg (ref 26.0–34.0)
MCHC: 32.4 g/dL (ref 30.0–36.0)
MCV: 86.2 fL (ref 80.0–100.0)
Platelets: 166 10*3/uL (ref 150–400)
RBC: 4.72 MIL/uL (ref 4.22–5.81)
RDW: 12.4 % (ref 11.5–15.5)
WBC: 9.5 10*3/uL (ref 4.0–10.5)
nRBC: 0 % (ref 0.0–0.2)

## 2021-08-01 MED ORDER — CLONIDINE HCL 0.1 MG PO TABS
0.1000 mg | ORAL_TABLET | Freq: Two times a day (BID) | ORAL | Status: DC
  Administered 2021-08-01 – 2021-08-03 (×5): 0.1 mg via ORAL
  Filled 2021-08-01 (×5): qty 1

## 2021-08-01 MED ORDER — METOPROLOL TARTRATE 5 MG/5ML IV SOLN
5.0000 mg | Freq: Once | INTRAVENOUS | Status: AC
  Administered 2021-08-01: 5 mg via INTRAVENOUS

## 2021-08-01 MED ORDER — METOPROLOL TARTRATE 5 MG/5ML IV SOLN
INTRAVENOUS | Status: AC
  Filled 2021-08-01: qty 5

## 2021-08-01 MED ORDER — HYDRALAZINE HCL 25 MG PO TABS
25.0000 mg | ORAL_TABLET | Freq: Three times a day (TID) | ORAL | Status: DC
  Administered 2021-08-01 – 2021-08-03 (×7): 25 mg via ORAL
  Filled 2021-08-01 (×7): qty 1

## 2021-08-01 MED ORDER — ENOXAPARIN SODIUM 30 MG/0.3ML IJ SOSY
30.0000 mg | PREFILLED_SYRINGE | Freq: Every day | INTRAMUSCULAR | Status: DC
  Administered 2021-08-02 – 2021-08-03 (×2): 30 mg via SUBCUTANEOUS
  Filled 2021-08-01 (×2): qty 0.3

## 2021-08-01 MED ORDER — DILTIAZEM HCL-DEXTROSE 125-5 MG/125ML-% IV SOLN (PREMIX)
5.0000 mg/h | INTRAVENOUS | Status: DC
  Administered 2021-08-01: 5 mg/h via INTRAVENOUS
  Administered 2021-08-02: 7.5 mg/h via INTRAVENOUS
  Filled 2021-08-01 (×2): qty 125

## 2021-08-01 NOTE — Progress Notes (Signed)
   07/31/21 2319  Assess: MEWS Score  Temp 97.9 F (36.6 C)  BP (!) 204/119  Pulse Rate 77  ECG Heart Rate 78  Resp 19  Level of Consciousness Alert  SpO2 96 %  O2 Device Room Air  Patient Activity (if Appropriate) In bed  Assess: MEWS Score  MEWS Temp 0  MEWS Systolic 2  MEWS Pulse 0  MEWS RR 0  MEWS LOC 0  MEWS Score 2  MEWS Score Color Yellow  Assess: if the MEWS score is Yellow or Red  Were vital signs taken at a resting state? Yes  Focused Assessment No change from prior assessment  Early Detection of Sepsis Score *See Row Information* Low  MEWS guidelines implemented *See Row Information* Yes  Treat  MEWS Interventions Administered prn meds/treatments  Pain Scale 0-10  Pain Score 0  Take Vital Signs  Increase Vital Sign Frequency  Yellow: Q 2hr X 2 then Q 4hr X 2, if remains yellow, continue Q 4hrs  Escalate  MEWS: Escalate Yellow: discuss with charge nurse/RN and consider discussing with provider and RRT  Notify: Charge Nurse/RN  Name of Charge Nurse/RN Notified Luther Redo., RN  Date Charge Nurse/RN Notified 07/31/21  Time Charge Nurse/RN Notified 2319  Document  Patient Outcome Other (Comment) (stable an don unit)  Progress note created (see row info) Yes

## 2021-08-01 NOTE — Hospital Course (Signed)
  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  In September of this year he presented to the emergency department with lower extremity edema.  Echocardiogram showed a large pericardial effusion for which he underwent a pericardiocentesis which removed over 2 L of fluid.  On follow-up he had another echocardiogram in November which showed another large circumferential pericardial effusion.  He has been sent to assess the need for pericardial window.  Dr. Kipp Brood evaluated the patient and relevant studies and recommended admission for robotic assisted thoracoscopic surgery for a pericardial window.  Hospital course  Patient was admitted electively and on 07/30/2021 was taken to the operating room at which time underwent robotic assisted thoracoscopic surgery for pericardial window.  He tolerated procedure well and was taken to the postanesthesia care unit in stable condition.  Postoperative hospital course:  The patient overall has done well.  He did have moderate amount of drainage for the first 2 days which was monitored.  On postop day 2 one of his tubes was removed.  He is having some difficulty with systolic hypertension and a hospitalist consultation was obtained to assist with management.  His laboratory values have been stable.  He does not have a postoperative anemia.  Incisions are noted to be healing well without evidence of infection.  He is tolerating gradually increasing activity using postoperative protocols.  Oxygen has been weaned and he maintains good saturations on room air.

## 2021-08-01 NOTE — Progress Notes (Signed)
Per tele, pt flipped into Afib at 1724. Charge RN Meliton Rattan notified. Ellwood Handler PA paged. See new orders.   08/01/21 1728  Assess: MEWS Score  BP (!) 172/128  Pulse Rate (!) 126  ECG Heart Rate (!) 132  Resp (!) 26  Level of Consciousness Alert  SpO2 95 %  Assess: MEWS Score  MEWS Temp 0  MEWS Systolic 0  MEWS Pulse 3  MEWS RR 2  MEWS LOC 0  MEWS Score 5  MEWS Score Color Red  Assess: if the MEWS score is Yellow or Red  Were vital signs taken at a resting state? Yes  Focused Assessment Change from prior assessment (see assessment flowsheet)  Early Detection of Sepsis Score *See Row Information* Low  MEWS guidelines implemented *See Row Information* Yes  Treat  Pain Scale 0-10  Pain Score 0  Take Vital Signs  Increase Vital Sign Frequency  Red: Q 1hr X 4 then Q 4hr X 4, if remains red, continue Q 4hrs  Escalate  MEWS: Escalate Red: discuss with charge nurse/RN and provider, consider discussing with RRT  Notify: Charge Nurse/RN  Name of Charge Nurse/RN Notified Meliton Rattan, RN  Date Charge Nurse/RN Notified 08/01/21  Time Charge Nurse/RN Notified 1740  Notify: Provider  Provider Name/Title Ellwood Handler, PA  Date Provider Notified 08/01/21  Time Provider Notified 1745  Notification Type Page  Notification Reason Change in status  Provider response See new orders  Time of Provider Response 1746

## 2021-08-01 NOTE — Progress Notes (Addendum)
[address]       [place] [address]             [phone]      2 Days Post-Op Procedure(s) (LRB): XI ROBOTIC ASSISTED THORACOSCOPY PERICARDIAL WINDOW (Right) Subjective: No c/o  Objective: Vital signs in last 24 hours: Temp:  [97.7 F (36.5 C)-98.7 F (37.1 C)] 97.9 F (36.6 C) (11/30 0500) Pulse Rate:  [76-85] 77 (11/30 0500) Cardiac Rhythm: Normal sinus rhythm (11/29 2000) Resp:  [19-28] 22 (11/30 0500) BP: (166-217)/(106-137) 202/136 (11/30 0500) SpO2:  [84 %-98 %] 94 % (11/30 0500)  Hemodynamic parameters for last 24 hours:    Intake/Output from previous day: 11/29 0701 - 11/30 0700 In: 720 [P.O.:720] Out: 1212 [Urine:900; Drains:260; Chest Tube:52] Intake/Output this shift: No intake/output data recorded.  General appearance: alert, cooperative, and no distress Heart: regular rate and rhythm Lungs: min dim in bases Abdomen: benign Extremities: no edema Wound: incis healing well  Lab Results: Recent Labs    07/31/21 0325 08/01/21 0045  WBC 9.9 9.5  HGB 12.0* 13.2  HCT 37.3* 40.7  PLT 174 166   BMET:  Recent Labs    07/31/21 0325 08/01/21 0045  NA 137 138  K 4.7 4.6  CL 109 108  CO2 22 23  GLUCOSE 120* 120*  BUN 30* 32*  CREATININE 1.97* 2.47*  CALCIUM 9.0 9.2    PT/INR: No results for input(s): LABPROT, INR in the last 72 hours. ABG    Component Value Date/Time   PHART 7.334 (L) 07/27/2021 1225   HCO3 20.5 07/27/2021 1225   TCO2 12.7 09/24/2007 1200   ACIDBASEDEF 4.3 (H) 07/27/2021 1225   O2SAT 97.5 07/27/2021 1225   CBG (last 3)  No results for input(s): GLUCAP in the last 72 hours.  Meds Scheduled Meds:  acetaminophen  1,000 mg Oral Q6H   Or   acetaminophen (TYLENOL) oral liquid 160 mg/5 mL  1,000 mg Oral Q6H   amLODipine  10 mg Oral Daily   aspirin  81 mg Oral Daily   atorvastatin  40 mg Oral Daily   bisacodyl  10 mg Oral Daily   calcitRIOL  0.25 mcg Oral Q M,W,F   cholecalciferol  1,000 Units  Oral Daily   dorzolamide-timolol  1 drop Left Eye BID   enoxaparin (LOVENOX) injection  40 mg Subcutaneous Daily   furosemide  40 mg Oral Once per day on Wed Fri   labetalol  200 mg Oral BID   latanoprost  1 drop Both Eyes QHS   levothyroxine  125 mcg Oral QAC breakfast   olopatadine  1 drop Both Eyes Daily   omega-3 acid ethyl esters  1 g Oral Daily   pantoprazole  40 mg Oral Daily   phenytoin  300 mg Oral QHS   senna-docusate  1 tablet Oral QHS   sodium bicarbonate  650 mg Oral BID   sodium zirconium cyclosilicate  5 g Oral Once per day on Mon Thu   cyanocobalamin  1,000 mcg Oral Daily   Continuous Infusions: PRN Meds:.labetalol, morphine injection, ondansetron (ZOFRAN) IV, traMADol  Xrays DG Chest Port 1 View  Result Date: 07/31/2021 CLINICAL DATA:  Pericardial effusion, chest tube present EXAM: PORTABLE CHEST 1 VIEW COMPARISON:  07/30/2021 FINDINGS: Right apical chest tube. Mediastinal drain is present. No pneumothorax. Similar cardiomediastinal contours with possible small amount pericardial air identified. Bibasilar atelectasis. IMPRESSION: Bibasilar atelectasis.  Probable small amount of pericardial air. Electronically Signed  By: Kurt Kurt M.D.   On: 07/31/2021 08:43   DG Chest Port 1 View  Result Date: 07/30/2021 CLINICAL DATA:  Pericardial effusion EXAM: PORTABLE CHEST 1 VIEW COMPARISON:  07/27/2021 FINDINGS: Mediastinal drain is in place. Small amount of pericardial air can be seen. The cardiac silhouette is smaller than on the previous study. There is mild bibasilar atelectasis. As seen previously, a small BB overlies the heart in the region of the left ventricle. IMPRESSION: Interval placement of pericardial drainage catheter. Cardiac silhouette is smaller. Small amount of pericardial air. Mild bibasilar atelectasis persists. Electronically Signed   By: Kurt Kurt M.D.   On: 07/30/2021 14:50    Assessment/Plan: S/P Procedure(s) (LRB): XI ROBOTIC ASSISTED  THORACOSCOPY PERICARDIAL WINDOW (Right)  1 afeb, Hypertensive s BP 166-217, on home meds, will try clonidine , has CKD limiting some  options 2 sats ok on RA 3 drains 260 cc- leave for now, will d/w MD poss removal  4 Creat 2.47 - similar to baseline 5 CBC stable 6 CXR stable 7 TOC on board to assist with getting back to SNF 8 routine pulm toilet/mobilize as able, Lovenox for DVT ppx  LOS: 2 days    Kurt Kurt 08/01/2021    Agree with above. Will consult hospital medicine to assist with blood pressure management. Will start removing drains today. Kurt Kurt

## 2021-08-01 NOTE — Plan of Care (Signed)
  Problem: Clinical Measurements: Goal: Ability to maintain clinical measurements within normal limits will improve Outcome: Not Progressing Goal: Diagnostic test results will improve Outcome: Not Progressing   Problem: Activity: Goal: Risk for activity intolerance will decrease Outcome: Not Progressing

## 2021-08-01 NOTE — Consult Note (Signed)
[facility] Medical Consultation  Kurt Nelson PPJ:[id number] DOB: [birth year] DOA: 07/30/2021 PCP: [name], MD   Requesting physician: Melodie Bouillon, MD Date of consultation: 08/01/2021 Reason for consultation: Medical management-blood pressure  Impression/Recommendations Principal Problem:   Pericardial effusion  Pericardial effusion: Patient status post pericardial window on 11/28. -Per cardiothoracic Hypertensive urgency/emergency: Patient noted to have blood pressures elevated up to 216/133 despite taking home medication regimen of amlodipine 10 mg daily, furosemide 40 mg on Wednesdays/Fridays, and labetalol 200 mg twice daily.  Clonidine 0.1 mg twice daily have been added this morning. -Add on hydralazine 25 mg 3 times daily -Will reassess need of additional adjustments -Continue as needed medication Chronic kidney disease stage IV: Kidney function appears to be near patient's baseline.     I will followup again tomorrow. Please contact me if I can be of assistance in the meanwhile. Thank you for this consultation.  Chief Complaint: Pericardial effusion  HPI:  Kurt Nelson is a 65 y.o. male with medical history significant of HTN, dyslipidemia, CVA, AAA, anemia of chronic disease, CKD stage III, hypothyroidism who presented for recurrent pericardial effusion.  He had last been hospitalized in September after presenting with lower extremity edema.  Echocardiogram noted large pericardial effusion for which he underwent pericardial centesis with removal of 2 L of fluid, but was found on repeat echocardiogram from 11/7 have another large circumferential pericardial effusion.  He was admitted to Dr. Kipp Brood of cardiothoracic surgery and underwent robotic assisted right pericardial window.  Since that time patient was noted to have elevated blood pressures 216/133.  Chest x-ray was otherwise unchanged with persistent right chest tube and pericardial drain and mild bibasilar.   Patient notes that his blood pressure was previously controlled and not been this high until after he had the procedure done.  Review of Systems: Review of Systems  Respiratory:  Negative for shortness of breath.     Past Medical History:  Diagnosis Date   AAA (abdominal aortic aneurysm)    Anemia, normocytic normochromic    CKD (chronic kidney disease) stage 3, GFR 30-59 ml/min (HCC)    Cocaine abuse (HCC)    ETOH abuse    GERD (gastroesophageal reflux disease)    Hyperglycemia    Hypertension    Hypothyroidism    ICH (intracerebral hemorrhage) (HCC)    Seizures ([place])    Stroke [facility])    Past Surgical History:  Procedure Laterality Date   EYE SURGERY     bilateral cataracts   PERICARDIOCENTESIS N/A 05/30/2021   Procedure: PERICARDIOCENTESIS;  Surgeon: Wellington Hampshire, MD;  Location: [facility] [facility];  Service: Cardiovascular;  Laterality: N/A;   XI ROBOTIC ASSISTED PERICARDIAL WINDOW Right 07/30/2021   Procedure: XI ROBOTIC ASSISTED THORACOSCOPY PERICARDIAL WINDOW;  Surgeon: Lajuana Matte, MD;  Location: [facility];  Service: Thoracic;  Laterality: Right;   Social History:  reports that he has been smoking cigarettes. He has been smoking an average of .25 packs per day. He has never used smokeless tobacco. He reports that he does not currently use alcohol. He reports that he does not currently use drugs.  Allergies  Allergen Reactions   Penicillins Itching and Rash   Family History  Problem Relation Age of Onset   Hypertension Mother    Hypertension Sister    Hypertension Sister     Prior to Admission medications   Medication Sig Start Date End Date Taking? Authorizing Provider  amLODipine (NORVASC) 10 MG tablet Take 10 mg by  mouth daily.   Yes [provider]  aspirin 81 MG tablet Take 81 mg by mouth daily.   Yes [provider]  atorvastatin (LIPITOR) 40 MG tablet Take 40 mg by mouth daily.   Yes [provider]  calcitRIOL  (ROCALTROL) 0.25 MCG capsule Take 0.25 mcg by mouth every Monday, Wednesday, and Friday. 04/30/21  Yes [provider]  Cholecalciferol 25 MCG (1000 UT) tablet Take 1 tablet by mouth daily. 06/01/21  Yes Domenic Polite, MD  cyanocobalamin 1000 MCG tablet Take 1 tablet (1,000 mcg total) by mouth daily. 06/02/21  Yes Domenic Polite, MD  dorzolamide-timolol (COSOPT) 22.3-6.8 MG/ML ophthalmic solution Place 1 drop into the left eye 2 (two) times daily.   Yes [provider]  fish oil-omega-3 fatty acids 1000 MG capsule Take 1 g by mouth in the morning and at bedtime.   Yes [provider]  furosemide (LASIX) 40 MG tablet Take 40 mg by mouth See admin instructions. Take 40 mg by mouth on Wednesday and Friday.   Yes [provider]  labetalol (NORMODYNE) 200 MG tablet Take 1 tablet (200 mg total) by mouth 2 (two) times daily. 06/01/21  Yes Domenic Polite, MD  levothyroxine (SYNTHROID) 125 MCG tablet Take 125 mcg by mouth daily before breakfast.   Yes [provider]  LUMIGAN 0.01 % SOLN Place 1 drop into both eyes at bedtime. 04/30/21  Yes [provider]  olopatadine (PATANOL) 0.1 % ophthalmic solution Place 1 drop into both eyes daily. 04/30/21  Yes [provider]  omeprazole (PRILOSEC) 20 MG capsule Take 20 mg by mouth daily.   Yes [provider]  phenytoin (DILANTIN) 100 MG ER capsule Take 300 mg by mouth at bedtime.    Yes [provider]  sodium bicarbonate 650 MG tablet Take 650 mg by mouth 2 (two) times daily.   Yes [provider]  sodium zirconium cyclosilicate (LOKELMA) 5 g packet Take 5 g by mouth See admin instructions. Take 5 g by mouth on Monday and Thursday   Yes [provider]   Physical Exam:  Constitutional: Chronically ill-appearing male who seems to be in no acute distress Vitals:   08/01/21 0351 08/01/21 0400 08/01/21 0500 08/01/21 0722  BP: (!) 206/128 (!) 200/137 (!) 202/136    Pulse: 76 82 77   Resp: 20 (!) 28 (!) 22   Temp: 97.7 F (36.5 C)  97.9 F (36.6 C) 97.8 F (36.6 C)  TempSrc: Oral   Oral  SpO2: 97% (!) 84% 94%   Weight:      Height:       Eyes: PERRL, lids and conjunctivae normal ENMT: Mucous membranes are moist. Posterior pharynx clear of any exudate or lesions.  Neck: normal, supple, no masses, no thyromegaly Respiratory: Mild decreased aeration no significant wheezes or rhonchi. Cardiovascular: Regular rate and rhythm with no significant lower extremity edema.  Lungs of the chest wall bandaged. Abdomen: no tenderness, no masses palpated. No hepatosplenomegaly. Bowel sounds positive.  Psychiatric:Alert and oriented x 3. Normal mood.   Labs on Admission:  Basic Metabolic Panel: Recent Labs  Lab 07/27/21 1230 07/31/21 0325 08/01/21 0045  NA 136 137 138  K 4.6 4.7 4.6  CL 106 109 108  CO2 19* 22 23  GLUCOSE 102* 120* 120*  BUN 44* 30* 32*  CREATININE 2.50* 1.97* 2.47*  CALCIUM 9.0 9.0 9.2   Liver Function Tests: Recent Labs  Lab 07/27/21 1230 08/01/21 0045  AST 21 18  ALT 22 20  ALKPHOS 180* 166*  BILITOT 0.4 0.6  PROT 7.2 7.0  ALBUMIN 3.5 3.1*   No results for input(s): LIPASE, AMYLASE in the last 168 hours. No results for input(s): AMMONIA in the last 168 hours. CBC: Recent Labs  Lab 07/27/21 1230 07/31/21 0325 08/01/21 0045  WBC 6.5 9.9 9.5  HGB 12.4* 12.0* 13.2  HCT 38.7* 37.3* 40.7  MCV 87.2 85.9 86.2  PLT 189 174 166   Cardiac Enzymes: No results for input(s): CKTOTAL, CKMB, CKMBINDEX, TROPONINI in the last 168 hours. BNP: Invalid input(s): POCBNP CBG: No results for input(s): GLUCAP in the last 168 hours.  Radiological Exams on Admission: DG Chest Port 1 View  Result Date: 08/01/2021 CLINICAL DATA:  Follow-up chest tube.  Pericardial effusion. EXAM: PORTABLE CHEST 1 VIEW COMPARISON:  07/31/2021 FINDINGS: Pericardial drain and right pleural drain remain in place. Mild enlargement of the cardiac  silhouette as seen previously. Aortic atherosclerosis and tortuosity as seen previously. No pneumothorax. Mild basilar atelectasis. IMPRESSION: No change. Persistent right chest tube and pericardial drain. Mild basilar atelectasis. No pneumothorax. Electronically Signed   By: Nelson Chimes M.D.   On: 08/01/2021 08:48   DG Chest Port 1 View  Result Date: 07/31/2021 CLINICAL DATA:  Pericardial effusion, chest tube present EXAM: PORTABLE CHEST 1 VIEW COMPARISON:  07/30/2021 FINDINGS: Right apical chest tube. Mediastinal drain is present. No pneumothorax. Similar cardiomediastinal contours with possible small amount pericardial air identified. Bibasilar atelectasis. IMPRESSION: Bibasilar atelectasis.  Probable small amount of pericardial air. Electronically Signed   By: Macy Mis M.D.   On: 07/31/2021 08:43   DG Chest Port 1 View  Result Date: 07/30/2021 CLINICAL DATA:  Pericardial effusion EXAM: PORTABLE CHEST 1 VIEW COMPARISON:  07/27/2021 FINDINGS: Mediastinal drain is in place. Small amount of pericardial air can be seen. The cardiac silhouette is smaller than on the previous study. There is mild bibasilar atelectasis. As seen previously, a small BB overlies the heart in the region of the left ventricle. IMPRESSION: Interval placement of pericardial drainage catheter. Cardiac silhouette is smaller. Small amount of pericardial air. Mild bibasilar atelectasis persists. Electronically Signed   By: Nelson Chimes M.D.   On: 07/30/2021 14:50    EKG: Independently reviewed.  Sinus rhythm at 70 bpm with premature atrial complexes.  Time spent: >45 minutes  [facility]   If 7PM-7AM, please contact night-coverage

## 2021-08-01 NOTE — Discharge Summary (Addendum)
Physician Discharge Summary  Patient ID: Kurt Nelson MRN: [id number] DOB/AGE: Nov 09, 1955 65 y.o.  Admit date: 07/30/2021 Discharge date: 08/03/2021  Admission Diagnoses:  Discharge Diagnoses:  Principal Problem:   Pericardial effusion  Patient Active Problem List   Diagnosis Date Noted   Essential hypertension 05/29/2021   Hypothyroid 05/29/2021   Dyslipidemia 05/29/2021   Pericardial effusion 05/29/2021   Acute CHF ([place]) 05/26/2021   Chronic kidney disease, stage IV (severe) ([place]) 11/24/2019   Hyperkalemia 11/24/2019   AAA (abdominal aortic aneurysm) without rupture 11/11/2012   Fx lateral malleolus-closed 10/21/2011   CALCANEAL FRACTURE, LEFT 01/18/2010     Discharged Condition: stable  History of Present Illness:     65 year old male referred for surgical evaluation of a recurrent pericardial effusion.  In September of this year he presented to the emergency department with lower extremity edema.  Echocardiogram showed a large pericardial effusion for which he underwent a pericardiocentesis which removed over 2 L of fluid.  On follow-up he had another echocardiogram in November which showed another large circumferential pericardial effusion.  He has been sent to assess the need for pericardial window.  Dr. Kipp Brood evaluated the patient and relevant studies and recommended admission for robotic assisted thoracoscopic surgery for a pericardial window.  Hospital course  Patient was admitted electively and on 07/30/2021 was taken to the operating room at which time underwent robotic assisted thoracoscopic surgery for pericardial window.  He tolerated procedure well and was taken to the postanesthesia care unit in stable condition.  Postoperative hospital course:  The patient overall has done well.  He did have moderate amount of drainage for the first 2 days which was monitored.  On postop day 2 one of his tubes was removed.  He is having some difficulty with systolic  hypertension and a hospitalist consultation was obtained to assist with management.  He has improved over time on current regimen.  He also had a episode of postoperative atrial fibrillation and was placed for a short-term on a Cardizem drip with return to sinus rhythm which he is maintaining.  His laboratory values have been stable.  He does not have a postoperative anemia.  Incisions are noted to be healing well without evidence of infection.  He is tolerating gradually increasing activity using postoperative protocols.  Oxygen has been weaned and he maintains good saturations on room air.  He is stable at this time to return to his previous skilled nursing facility.  Consults:  Internal medicine  Significant Diagnostic Studies:  DG Chest 2 View  Result Date: 07/27/2021 CLINICAL DATA:  65 year old male under preoperative evaluation prior to pericardial window placement. Recurrent pericardial effusion. EXAM: CHEST - 2 VIEW COMPARISON:  Chest x-ray 05/26/2021. FINDINGS: Lung volumes are normal. No consolidative airspace disease. No pleural effusions. No pneumothorax. No evidence of pulmonary edema. Cardiopericardial silhouette is enlarged, but decreased compared to the prior study. Upper mediastinal contours are within normal limits. Atherosclerotic calcifications in the thoracic aorta. IMPRESSION: 1. Improved enlargement of the cardiopericardial silhouette, which likely reflects decreasing size of known pericardial effusion. 2. No other radiographic evidence of acute cardiopulmonary disease. Electronically Signed   By: Vinnie Langton M.D.   On: 07/27/2021 13:21   CT Chest Wo Contrast  Result Date: 07/27/2021 CLINICAL DATA:  Pericardial effusion EXAM: CT CHEST WITHOUT CONTRAST TECHNIQUE: Multidetector CT imaging of the chest was performed following the standard protocol without IV contrast. COMPARISON:  Previous studies including the chest radiograph done earlier today FINDINGS: Cardiovascular: Large  pericardial  effusion is present. There is a metallic density in the inferior aspect of left ventricle. Heart is enlarged in size. Coronary artery calcifications are seen. Mediastinum/Nodes: There are slightly enlarged lymph nodes in mediastinum. Lungs/Pleura: There is no focal pulmonary consolidation. There are linear densities in both lower lung fields suggesting scarring or subsegmental atelectasis. There is no pleural effusion or pneumothorax. Upper Abdomen: Unremarkable. Musculoskeletal: Unremarkable. IMPRESSION: Large pericardial effusion. Coronary artery calcifications are seen. Small linear densities in the lower lung fields suggest scarring or subsegmental atelectasis. There is no focal pulmonary consolidation. There is no pleural effusion. Electronically Signed   By: Elmer Picker M.D.   On: 07/27/2021 15:27   DG Chest Port 1 View  Result Date: 08/01/2021 CLINICAL DATA:  Follow-up chest tube.  Pericardial effusion. EXAM: PORTABLE CHEST 1 VIEW COMPARISON:  07/31/2021 FINDINGS: Pericardial drain and right pleural drain remain in place. Mild enlargement of the cardiac silhouette as seen previously. Aortic atherosclerosis and tortuosity as seen previously. No pneumothorax. Mild basilar atelectasis. IMPRESSION: No change. Persistent right chest tube and pericardial drain. Mild basilar atelectasis. No pneumothorax. Electronically Signed   By: Nelson Chimes M.D.   On: 08/01/2021 08:48   DG Chest Port 1 View  Result Date: 07/31/2021 CLINICAL DATA:  Pericardial effusion, chest tube present EXAM: PORTABLE CHEST 1 VIEW COMPARISON:  07/30/2021 FINDINGS: Right apical chest tube. Mediastinal drain is present. No pneumothorax. Similar cardiomediastinal contours with possible small amount pericardial air identified. Bibasilar atelectasis. IMPRESSION: Bibasilar atelectasis.  Probable small amount of pericardial air. Electronically Signed   By: Macy Mis M.D.   On: 07/31/2021 08:43   DG Chest Port 1  View  Result Date: 07/30/2021 CLINICAL DATA:  Pericardial effusion EXAM: PORTABLE CHEST 1 VIEW COMPARISON:  07/27/2021 FINDINGS: Mediastinal drain is in place. Small amount of pericardial air can be seen. The cardiac silhouette is smaller than on the previous study. There is mild bibasilar atelectasis. As seen previously, a small BB overlies the heart in the region of the left ventricle. IMPRESSION: Interval placement of pericardial drainage catheter. Cardiac silhouette is smaller. Small amount of pericardial air. Mild bibasilar atelectasis persists. Electronically Signed   By: Nelson Chimes M.D.   On: 07/30/2021 14:50   ECHOCARDIOGRAM LIMITED  Result Date: 07/09/2021    ECHOCARDIOGRAM LIMITED REPORT   Patient Name:   Kurt Nelson Date of Exam: 07/09/2021 Medical Rec #:  [id number]    Height:       67.0 in Accession #:    [id number]   Weight:       126.0 lb Date of Birth:  1956/05/25   BSA:          1.662 m Patient Age:    69 years     BP:           173/98 mmHg Patient Gender: M            HR:           69 bpm. Exam Location:  [facility] Procedure: Limited Echo and Cardiac Doppler Indications:    I31.39 (ICD-10-CM) - Pericardial effusion  History:        Patient has prior history of Echocardiogram examinations, most                 recent 05/30/2021. Risk Factors:Hypertension, Dyslipidemia and                 Current Smoker. Hx of Chronic kidney disease. Large pericardial  effusion, Suspected amyloid. Chronic kidney disease, stage IV                 (severe) ([place]). ETOH and Cocaine abuse ([place]) (From Hx).  Sonographer:    Alvino Chapel RCS Referring Phys: [id number] [place]  1. Large pericardial effusion. The pericardial effusion is circumferential. There is normal IVC that is easily compressible. Though respirometer not uses, I suspect there respirophasic variation in the mitral valve inflow velocity.  2. Left ventricular ejection fraction, by estimation, is 60 to 65%. The left  ventricle has normal function. The left ventricle has no regional wall motion abnormalities. There is severe concentric left ventricular hypertrophy.  3. Right ventricular systolic function is normal. The right ventricular size is normal.  4. The mitral valve is normal in structure.  5. The inferior vena cava is normal in size with greater than 50% respiratory variability, suggesting right atrial pressure of 3 mmHg. Comparison(s): A prior study was performed on 05/30/21. Return of large pericardial effusion: reaching out to priamry team. FINDINGS  Left Ventricle: Left ventricular ejection fraction, by estimation, is 60 to 65%. The left ventricle has normal function. The left ventricle has no regional wall motion abnormalities. There is severe concentric left ventricular hypertrophy. Right Ventricle: The right ventricular size is normal. No increase in right ventricular wall thickness. Right ventricular systolic function is normal. Pericardium: A large pericardial effusion is present. The pericardial effusion is circumferential. There is no evidence of cardiac tamponade. Presence of pericardial fat pad. Mitral Valve: The mitral valve is normal in structure. Tricuspid Valve: The tricuspid valve is normal in structure. Tricuspid valve regurgitation is not demonstrated. No evidence of tricuspid stenosis. Venous: The inferior vena cava is normal in size with greater than 50% respiratory variability, suggesting right atrial pressure of 3 mmHg. Rudean Haskell MD Electronically signed by Rudean Haskell MD Signature Date/Time: 07/09/2021/3:27:09 PM    Final     Treatments: surgery:  07/30/2021   Patient:  Kurt Nelson Pre-Op Dx: Recurrent pericardial effusion Post-op Dx: Same Procedure: - Robotic assisted right video thoracoscopy - Pericardial window - Intercostal nerve block   Surgeon and Role:      * Lightfoot, Lucile Crater, MD - Primary    Evonnie Pat, PA-C- assisting with bedside surgery. Discharge  Exam: Blood pressure (!) 134/98, pulse 85, temperature 97.8 F (36.6 C), temperature source Oral, resp. rate 17, height 5\' 7"  (1.702 m), weight 57.6 kg, SpO2 97 %.  General appearance: alert, cooperative, and no distress Heart: regular rate and rhythm Lungs: clear to auscultation bilaterally Abdomen: benign Extremities: no edema or calf tenderness Wound: incis healing well Disposition: Discharge disposition: 03-Skilled Nursing Facility      Discharge Instructions     Discharge patient   Complete by: As directed    When bed available at prior SNF and chest tube is out   Discharge disposition: 03-Skilled [facility]   Discharge patient date: 08/03/2021      Allergies as of 08/03/2021       Reactions   Penicillins Itching, Rash        Medication List     TAKE these medications    amLODipine 10 MG tablet Commonly known as: NORVASC Take 10 mg by mouth daily.   aspirin 81 MG tablet Take 81 mg by mouth daily.   atorvastatin 40 MG tablet Commonly known as: LIPITOR Take 40 mg by mouth daily.   calcitRIOL 0.25 MCG capsule Commonly known as: ROCALTROL Take 0.25  mcg by mouth every Monday, Wednesday, and Friday.   cloNIDine 0.1 MG tablet Commonly known as: CATAPRES Take 1 tablet (0.1 mg total) by mouth 2 (two) times daily.   dorzolamide-timolol 22.3-6.8 MG/ML ophthalmic solution Commonly known as: COSOPT Place 1 drop into the left eye 2 (two) times daily.   fish oil-omega-3 fatty acids 1000 MG capsule Take 1 g by mouth in the morning and at bedtime.   furosemide 40 MG tablet Commonly known as: LASIX Take 40 mg by mouth See admin instructions. Take 40 mg by mouth on Wednesday and Friday.   hydrALAZINE 50 MG tablet Commonly known as: APRESOLINE Take 1 tablet (50 mg total) by mouth every 8 (eight) hours.   labetalol 300 MG tablet Commonly known as: NORMODYNE Take 1 tablet (300 mg total) by mouth 2 (two) times daily. What changed:  medication  strength how much to take   levothyroxine 125 MCG tablet Commonly known as: SYNTHROID Take 125 mcg by mouth daily before breakfast.   Lokelma 5 g packet Generic drug: sodium zirconium cyclosilicate Take 5 g by mouth See admin instructions. Take 5 g by mouth on Monday and Thursday   Lumigan 0.01 % Soln Generic drug: bimatoprost Place 1 drop into both eyes at bedtime.   olopatadine 0.1 % ophthalmic solution Commonly known as: PATANOL Place 1 drop into both eyes daily.   omeprazole 20 MG capsule Commonly known as: PRILOSEC Take 20 mg by mouth daily.   phenytoin 100 MG ER capsule Commonly known as: DILANTIN Take 300 mg by mouth at bedtime.   sodium bicarbonate 650 MG tablet Take 650 mg by mouth 2 (two) times daily.   vitamin B-12 1000 MCG tablet Commonly known as: CYANOCOBALAMIN Take 1 tablet (1,000 mcg total) by mouth daily.   Vitamin D3 25 MCG tablet Commonly known as: Vitamin D Take 1 tablet by mouth daily.        Follow-up Information     Lightfoot, Lucile Crater, MD Follow up.   Specialty: Cardiothoracic Surgery Why: Please see discharge paperwork for f/u appt with surgeon. Contact information: [address] [id number] [phone]                 Signed: [name] PA-C 08/03/2021, 11:02 AM

## 2021-08-02 DIAGNOSIS — I160 Hypertensive urgency: Secondary | ICD-10-CM | POA: Diagnosis not present

## 2021-08-02 DIAGNOSIS — I3139 Other pericardial effusion (noninflammatory): Secondary | ICD-10-CM | POA: Diagnosis not present

## 2021-08-02 LAB — SURGICAL PATHOLOGY

## 2021-08-02 MED ORDER — LABETALOL HCL 300 MG PO TABS
300.0000 mg | ORAL_TABLET | Freq: Two times a day (BID) | ORAL | Status: DC
  Administered 2021-08-02 – 2021-08-03 (×3): 300 mg via ORAL
  Filled 2021-08-02 (×4): qty 1

## 2021-08-02 NOTE — Care Management Important Message (Signed)
Important Message  Patient Details  Name: Kurt Nelson MRN: [id number] Date of Birth: [birth year]   Medicare Important Message Given:  Yes     Aniayah Alaniz [address] 08/02/2021, 1:47 PM

## 2021-08-02 NOTE — Progress Notes (Signed)
JP not holding charge.  Assessed with another RN and no leaks noted.  Contacted PA.  Advised to leave in place and continue to monitor.  No SHOB noted.  VSS.

## 2021-08-02 NOTE — Progress Notes (Signed)
[facility] PROGRESS NOTE    Progress Note  [name]  KWI:[id number] DOB: [birth year] DOA: 07/30/2021 PCP: [name], MD     Brief Narrative:   Kurt Nelson is an 65 y.o. male 65 year old with past medical history of essential hypertension, dyslipidemia CVA anemia of chronic disease, chronic kidney disease stage III who presents with recurrent pleural effusion discharge in September for lower extremity edema echocardiogram showed large pericardial effusion underwent pericardiocentesis removed 2 L repeated 2D echo from 07/30/2021 showed large circumferential pericardial effusion admitted under Dr. Kipp Brood underwent robotic assisted pericardial window.  Since then the patient has been noted to have an elevated blood pressure chest x-ray showed right chest tube and pericardial drain    Assessment/Plan:   Pericardial effusion: Further management per cardiothoracic surgery.  Hypertensive urgency: Noted to have a significant elevated blood pressure of 216/133 despite taking home regimen. Currently on amlodipine Lasix labetalol clonidine twice a day. Hydralazine 3 times a day was added on 08/01/2021. Go ahead and increase labetalol to 300 twice a day He is also on IV diltiazem at 5 we will go ahead and titrate for heart rate less than 100  DVT prophylaxis: lovenox Family Communication:none Status is: Inpatient    Code Status:     Code Status Orders  (From admission, onward)           Start     Ordered   07/30/21 1604  Full code  Continuous        07/30/21 1603           Code Status History     Date Active Date Inactive Code Status Order ID Comments User Context   05/26/2021 1349 06/01/2021 2113 Full Code [id number]  Rodena Goldmann, DO ED         IV Access:   Peripheral IV   Procedures and diagnostic studies:   DG Chest Port 1 View  Result Date: 08/01/2021 CLINICAL DATA:  Follow-up chest tube.  Pericardial effusion. EXAM: PORTABLE CHEST 1  VIEW COMPARISON:  07/31/2021 FINDINGS: Pericardial drain and right pleural drain remain in place. Mild enlargement of the cardiac silhouette as seen previously. Aortic atherosclerosis and tortuosity as seen previously. No pneumothorax. Mild basilar atelectasis. IMPRESSION: No change. Persistent right chest tube and pericardial drain. Mild basilar atelectasis. No pneumothorax. Electronically Signed   By: Nelson Chimes M.D.   On: 08/01/2021 08:48     Medical Consultants:   None.   Subjective:    DEMARRIUS Nelson has no new complaints today tolerating his diet.  Objective:    Vitals:   08/02/21 0100 08/02/21 0200 08/02/21 0300 08/02/21 0628  BP: 107/83 127/86 (!) 136/91 (!) 155/101  Pulse: 84 88 (!) 103   Resp: 17 17 20    Temp:   97.8 F (36.6 C)   TempSrc:   Oral   SpO2:   94%   Weight:      Height:       SpO2: 94 %   Intake/Output Summary (Last 24 hours) at 08/02/2021 0718 Last data filed at 08/02/2021 0300 Gross per 24 hour  Intake 641.05 ml  Output 564 ml  Net 77.05 ml   Filed Weights   07/30/21 0835  Weight: 57.6 kg    Exam: General exam: In no acute distress. Respiratory system: Good air movement and clear to auscultation. Cardiovascular system: S1 & S2 heard, RRR. No JVD. Gastrointestinal system: Abdomen is nondistended, soft and nontender.  Extremities: No pedal edema. Skin: No  rashes, lesions or ulcers Psychiatry: Judgement and insight appear normal. Mood & affect appropriate.    Data Reviewed:    Labs: Basic Metabolic Panel: Recent Labs  Lab 07/27/21 1230 07/31/21 0325 08/01/21 0045  NA 136 137 138  K 4.6 4.7 4.6  CL 106 109 108  CO2 19* 22 23  GLUCOSE 102* 120* 120*  BUN 44* 30* 32*  CREATININE 2.50* 1.97* 2.47*  CALCIUM 9.0 9.0 9.2   GFR Estimated Creatinine Clearance: 24.3 mL/min (A) (by C-G formula based on SCr of 2.47 mg/dL (H)). Liver Function Tests: Recent Labs  Lab 07/27/21 1230 08/01/21 0045  AST 21 18  ALT 22 20  ALKPHOS 180*  166*  BILITOT 0.4 0.6  PROT 7.2 7.0  ALBUMIN 3.5 3.1*   No results for input(s): LIPASE, AMYLASE in the last 168 hours. No results for input(s): AMMONIA in the last 168 hours. Coagulation profile Recent Labs  Lab 07/27/21 1230  INR 1.0   COVID-19 Labs  No results for input(s): DDIMER, FERRITIN, LDH, CRP in the last 72 hours.  Lab Results  Component Value Date   SARSCOV2NAA NEGATIVE 07/27/2021   [place] NEGATIVE 06/01/2021   [place] NEGATIVE 05/26/2021    CBC: Recent Labs  Lab 07/27/21 1230 07/31/21 0325 08/01/21 0045  WBC 6.5 9.9 9.5  HGB 12.4* 12.0* 13.2  HCT 38.7* 37.3* 40.7  MCV 87.2 85.9 86.2  PLT 189 174 166   Cardiac Enzymes: No results for input(s): CKTOTAL, CKMB, CKMBINDEX, TROPONINI in the last 168 hours. BNP (last 3 results) No results for input(s): PROBNP in the last 8760 hours. CBG: No results for input(s): GLUCAP in the last 168 hours. D-Dimer: No results for input(s): DDIMER in the last 72 hours. Hgb A1c: No results for input(s): HGBA1C in the last 72 hours. Lipid Profile: No results for input(s): CHOL, HDL, LDLCALC, TRIG, CHOLHDL, LDLDIRECT in the last 72 hours. Thyroid function studies: No results for input(s): TSH, T4TOTAL, T3FREE, THYROIDAB in the last 72 hours.  Invalid input(s): FREET3 Anemia work up: No results for input(s): VITAMINB12, FOLATE, FERRITIN, TIBC, IRON, RETICCTPCT in the last 72 hours. Sepsis Labs: Recent Labs  Lab 07/27/21 1230 07/31/21 0325 08/01/21 0045  WBC 6.5 9.9 9.5   Microbiology Recent Results (from the past 240 hour(s))  SARS CORONAVIRUS 2 (TAT 6-24 HRS) Nasopharyngeal Nasopharyngeal Swab     Status: None   Collection Time: 07/27/21 11:59 AM   Specimen: Nasopharyngeal Swab  Result Value Ref Range Status   SARS Coronavirus 2 NEGATIVE NEGATIVE Final    Comment: (NOTE) SARS-CoV-2 target nucleic acids are NOT DETECTED.  The SARS-CoV-2 RNA is generally detectable in upper and lower respiratory  specimens during the acute phase of infection. Negative results do not preclude SARS-CoV-2 infection, do not rule out co-infections with other pathogens, and should not be used as the sole basis for treatment or other patient management decisions. Negative results must be combined with clinical observations, patient history, and epidemiological information. The expected result is Negative.  Fact Sheet for Patients: SugarRoll.be  Fact Sheet for Healthcare Providers: https://www.woods-mathews.com/  This test is not yet approved or cleared by the [place] FDA and  has been authorized for detection and/or diagnosis of SARS-CoV-2 by FDA under an Emergency Use Authorization (EUA). This EUA will remain  in effect (meaning this test can be used) for the duration of the COVID-19 declaration under Se ction 564(b)(1) of the Act, 21 U.S.C. section 360bbb-3(b)(1), unless the authorization is terminated or revoked sooner.  Performed at [facility], [place] [address], [place], [place] [address]   Surgical pcr screen     Status: None   Collection Time: 07/27/21 11:59 AM   Specimen: Nasal Mucosa; Nasal Swab  Result Value Ref Range Status   MRSA, PCR NEGATIVE NEGATIVE Final   Staphylococcus aureus NEGATIVE NEGATIVE Final    Comment: (NOTE) The Xpert SA Assay (FDA approved for NASAL specimens in patients 27 years of age and older), is one component of a comprehensive surveillance program. It is not intended to diagnose infection nor to guide or monitor treatment. Performed at [facility], [place] [address], [place], [place] [address]      Medications:    acetaminophen  1,000 mg Oral Q6H   Or   acetaminophen (TYLENOL) oral liquid 160 mg/5 mL  1,000 mg Oral Q6H   amLODipine  10 mg Oral Daily   aspirin  81 mg Oral Daily   atorvastatin  40 mg Oral Daily   bisacodyl  10 mg Oral Daily   calcitRIOL  0.25 mcg Oral Q M,W,F   cholecalciferol   1,000 Units Oral Daily   cloNIDine  0.1 mg Oral BID   dorzolamide-timolol  1 drop Left Eye BID   enoxaparin (LOVENOX) injection  30 mg Subcutaneous Daily   furosemide  40 mg Oral Once per day on Wed Fri   hydrALAZINE  25 mg Oral Q8H   labetalol  200 mg Oral BID   latanoprost  1 drop Both Eyes QHS   levothyroxine  125 mcg Oral QAC breakfast   olopatadine  1 drop Both Eyes Daily   omega-3 acid ethyl esters  1 g Oral Daily   pantoprazole  40 mg Oral Daily   phenytoin  300 mg Oral QHS   senna-docusate  1 tablet Oral QHS   sodium bicarbonate  650 mg Oral BID   sodium zirconium cyclosilicate  5 g Oral Once per day on Mon Thu   cyanocobalamin  1,000 mcg Oral Daily   Continuous Infusions:  diltiazem (CARDIZEM) infusion 5 mg/hr (08/02/21 0300)      LOS: 3 days   Charlynne Cousins  [facility]  08/02/2021, 7:18 AM

## 2021-08-02 NOTE — Progress Notes (Signed)
[address]       [name] [address]             [phone]      3 Days Post-Op Procedure(s) (LRB): XI ROBOTIC ASSISTED THORACOSCOPY PERICARDIAL WINDOW (Right) Subjective: Feels ok, no palpitations  Objective: Vital signs in last 24 hours: Temp:  [97.6 F (36.4 C)-98.9 F (37.2 C)] 97.8 F (36.6 C) (12/01 0300) Pulse Rate:  [84-128] 103 (12/01 0300) Cardiac Rhythm: Atrial fibrillation (12/01 0716) Resp:  [17-26] 20 (12/01 0300) BP: (100-225)/(74-164) 155/101 (12/01 0628) SpO2:  [92 %-98 %] 94 % (12/01 0300)  Hemodynamic parameters for last 24 hours:    Intake/Output from previous day: 11/30 0701 - 12/01 0700 In: 641.1 [P.O.:600; I.V.:41.1] Out: 564 [Urine:400; Drains:90; Chest Tube:74] Intake/Output this shift: No intake/output data recorded.  General appearance: alert, cooperative, and no distress Heart: irregularly irregular rhythm and tachy Lungs: clear to auscultation bilaterally Abdomen: benign Extremities: no edema or calf tenderness Wound: incis healing well  Lab Results: Recent Labs    07/31/21 0325 08/01/21 0045  WBC 9.9 9.5  HGB 12.0* 13.2  HCT 37.3* 40.7  PLT 174 166   BMET:  Recent Labs    07/31/21 0325 08/01/21 0045  NA 137 138  K 4.7 4.6  CL 109 108  CO2 22 23  GLUCOSE 120* 120*  BUN 30* 32*  CREATININE 1.97* 2.47*  CALCIUM 9.0 9.2    PT/INR: No results for input(s): LABPROT, INR in the last 72 hours. ABG    Component Value Date/Time   PHART 7.334 (L) 07/27/2021 1225   HCO3 20.5 07/27/2021 1225   TCO2 12.7 09/24/2007 1200   ACIDBASEDEF 4.3 (H) 07/27/2021 1225   O2SAT 97.5 07/27/2021 1225   CBG (last 3)  No results for input(s): GLUCAP in the last 72 hours.  Meds Scheduled Meds:  acetaminophen  1,000 mg Oral Q6H   Or   acetaminophen (TYLENOL) oral liquid 160 mg/5 mL  1,000 mg Oral Q6H   amLODipine  10 mg Oral Daily   aspirin  81 mg Oral Daily   atorvastatin  40 mg Oral Daily   bisacodyl  10 mg  Oral Daily   calcitRIOL  0.25 mcg Oral Q M,W,F   cholecalciferol  1,000 Units Oral Daily   cloNIDine  0.1 mg Oral BID   dorzolamide-timolol  1 drop Left Eye BID   enoxaparin (LOVENOX) injection  30 mg Subcutaneous Daily   furosemide  40 mg Oral Once per day on Wed Fri   hydrALAZINE  25 mg Oral Q8H   labetalol  300 mg Oral BID   latanoprost  1 drop Both Eyes QHS   levothyroxine  125 mcg Oral QAC breakfast   olopatadine  1 drop Both Eyes Daily   omega-3 acid ethyl esters  1 g Oral Daily   pantoprazole  40 mg Oral Daily   phenytoin  300 mg Oral QHS   senna-docusate  1 tablet Oral QHS   sodium bicarbonate  650 mg Oral BID   sodium zirconium cyclosilicate  5 g Oral Once per day on Mon Thu   cyanocobalamin  1,000 mcg Oral Daily   Continuous Infusions:  diltiazem (CARDIZEM) infusion 5 mg/hr (08/02/21 0300)   PRN Meds:.labetalol, morphine injection, ondansetron (ZOFRAN) IV, traMADol  Xrays DG Chest Port 1 View  Result Date: 08/01/2021 CLINICAL DATA:  Follow-up chest tube.  Pericardial effusion. EXAM: PORTABLE CHEST 1 VIEW COMPARISON:  07/31/2021 FINDINGS: Pericardial drain and  right pleural drain remain in place. Mild enlargement of the cardiac silhouette as seen previously. Aortic atherosclerosis and tortuosity as seen previously. No pneumothorax. Mild basilar atelectasis. IMPRESSION: No change. Persistent right chest tube and pericardial drain. Mild basilar atelectasis. No pneumothorax. Electronically Signed   By: Nelson Chimes M.D.   On: 08/01/2021 08:48    Assessment/Plan: S/P Procedure(s) (LRB): XI ROBOTIC ASSISTED THORACOSCOPY PERICARDIAL WINDOW (Right)  1 BP control improving, afebrile, atrial fibrillation- started on low dose cardizem Gtt- medicine service assisting with management- prob not a good candidate for ACRX with general debility, fall risk . May need to consider amiodarone 2 sats good on RA 3 drain 90 cc, poss d/c soon 4 no new labs  5 pulm hygiene and mobilize as  able   LOS: 3 days    [name] PA-C Pager [phone] 08/02/2021

## 2021-08-03 ENCOUNTER — Other Ambulatory Visit (HOSPITAL_COMMUNITY): Payer: Self-pay

## 2021-08-03 DIAGNOSIS — I3139 Other pericardial effusion (noninflammatory): Secondary | ICD-10-CM | POA: Diagnosis not present

## 2021-08-03 DIAGNOSIS — I160 Hypertensive urgency: Secondary | ICD-10-CM | POA: Diagnosis not present

## 2021-08-03 LAB — BASIC METABOLIC PANEL
Anion gap: 8 (ref 5–15)
BUN: 56 mg/dL — ABNORMAL HIGH (ref 8–23)
CO2: 25 mmol/L (ref 22–32)
Calcium: 8.6 mg/dL — ABNORMAL LOW (ref 8.9–10.3)
Chloride: 104 mmol/L (ref 98–111)
Creatinine, Ser: 2.85 mg/dL — ABNORMAL HIGH (ref 0.61–1.24)
GFR, Estimated: 24 mL/min — ABNORMAL LOW (ref >60)
Glucose, Bld: 108 mg/dL — ABNORMAL HIGH (ref 70–99)
Potassium: 4.2 mmol/L (ref 3.5–5.1)
Sodium: 137 mmol/L (ref 135–145)

## 2021-08-03 MED ORDER — LABETALOL HCL 300 MG PO TABS: 300.0000 mg | ORAL_TABLET | Freq: Two times a day (BID) | ORAL | Status: DC

## 2021-08-03 MED ORDER — CLONIDINE HCL 0.1 MG PO TABS: 0.1000 mg | ORAL_TABLET | Freq: Two times a day (BID) | ORAL | Status: DC

## 2021-08-03 MED ORDER — HYDRALAZINE HCL 50 MG PO TABS
50.0000 mg | ORAL_TABLET | Freq: Three times a day (TID) | ORAL | Status: DC
Start: 2021-08-03 — End: 2021-08-03

## 2021-08-03 MED ORDER — HYDRALAZINE HCL 50 MG PO TABS
50.0000 mg | ORAL_TABLET | Freq: Three times a day (TID) | ORAL | 1 refills | Status: DC
  Filled 2021-08-03: qty 90, 30d supply, fill #0

## 2021-08-03 MED ORDER — HYDRALAZINE HCL 50 MG PO TABS: 50.0000 mg | ORAL_TABLET | Freq: Three times a day (TID) | ORAL | Status: DC

## 2021-08-03 MED ORDER — CLONIDINE HCL 0.1 MG PO TABS: 0.1000 mg | ORAL_TABLET | Freq: Two times a day (BID) | ORAL | 1 refills | Status: DC

## 2021-08-03 MED ORDER — HYDRALAZINE HCL 50 MG PO TABS: 50.0000 mg | ORAL_TABLET | Freq: Three times a day (TID) | ORAL | 1 refills | Status: DC

## 2021-08-03 MED ORDER — LABETALOL HCL 300 MG PO TABS: 300.0000 mg | ORAL_TABLET | Freq: Two times a day (BID) | ORAL | 1 refills | Status: DC

## 2021-08-03 MED ORDER — LABETALOL HCL 300 MG PO TABS
300.0000 mg | ORAL_TABLET | Freq: Two times a day (BID) | ORAL | 1 refills | Status: AC
  Filled 2021-08-03: qty 60, 30d supply, fill #0

## 2021-08-03 MED ORDER — CLONIDINE HCL 0.1 MG PO TABS: 0.2000 mg | ORAL_TABLET | Freq: Two times a day (BID) | ORAL | Status: DC

## 2021-08-03 MED ORDER — CLONIDINE HCL 0.1 MG PO TABS
0.1000 mg | ORAL_TABLET | Freq: Two times a day (BID) | ORAL | 1 refills | Status: DC
  Filled 2021-08-03: qty 60, 30d supply, fill #0

## 2021-08-03 NOTE — Progress Notes (Signed)
PT Cancellation Note  Patient Details Name: Kurt Nelson MRN: [id number] DOB: February 24, 1956   Cancelled Treatment:    Reason Eval/Treat Not Completed: PT screened, no needs identified, will sign off Pt getting ready to d/c and no acute PT needs. If needs change, please re-consult.   Reuel Derby, PT, DPT  Acute Rehabilitation Services  Pager: [phone] Office: [phone]    Rudean Hitt 08/03/2021, 1:25 PM

## 2021-08-03 NOTE — TOC Initial Note (Signed)
Transition of Care [facility]) - Initial/Assessment Note    Patient Details  Name: Kurt Nelson MRN: [id number] Date of Birth: [birth year]  Transition of Care [facility]) CM/SW Contact:    Tresa Endo Phone Number: 08/03/2021, 11:59 AM  Clinical Narrative:                 Pt is from [place] and will return today. CSW spoke with [facility] and confirmed that pt can return and faxed over pt DC Summary and FL2. CSW spoke with pt daughter to confirm pick up. Pt daughter agreed to pick pt up after 2:30 when she gets her son from school. Pt daughter cannot bring son in and would like for pt to be wheeled out to her car.   Expected Discharge Plan: Assisted Living Barriers to Discharge: Barriers Resolved   Patient Goals and CMS Choice Patient states their goals for this hospitalization and ongoing recovery are:: Return to ALF CMS Medicare.gov Compare Post Acute Care list provided to:: Other (Comment Required) Choice offered to / list presented to : NA  Expected Discharge Plan and Services Expected Discharge Plan: Assisted Living In-house Referral: Clinical Social Work   Post Acute Care Choice: NA Living arrangements for the past 2 months: [place] Expected Discharge Date: 08/03/21                                    Prior Living Arrangements/Services Living arrangements for the past 2 months: [place] Lives with:: Facility Resident Patient language and need for interpreter reviewed:: Yes Do you feel safe going back to the place where you live?: Yes      Need for Family Participation in Patient Care: Yes (Comment) Care giver support system in place?: Yes (comment)   Criminal Activity/Legal Involvement Pertinent to Current Situation/Hospitalization: No - Comment as needed  Activities of Daily Living Home Assistive Devices/Equipment: Environmental consultant (specify type), Cane (specify quad or straight), Blood pressure cuff, Scales, Shower chair with  back ADL Screening (condition at time of admission) Patient's cognitive ability adequate to safely complete daily activities?: Yes Is the patient deaf or have difficulty hearing?: No Does the patient have difficulty seeing, even when wearing glasses/contacts?: No Does the patient have difficulty concentrating, remembering, or making decisions?: No Patient able to express need for assistance with ADLs?: Yes Does the patient have difficulty dressing or bathing?: Yes Independently performs ADLs?: No Does the patient have difficulty walking or climbing stairs?: Yes Weakness of Legs: Both Weakness of Arms/Hands: Both  Permission Sought/Granted Permission sought to share information with : Facility Sport and exercise psychologist, Family Supports Permission granted to share information with : Yes, Verbal Permission Granted  Share Information with NAME: Burman Foster  Permission granted to share info w AGENCY: [facility]  Permission granted to share info w Relationship: Daughter  Permission granted to share info w Contact Information: [phone]  Emotional Assessment Appearance:: Appears younger than stated age Attitude/Demeanor/Rapport: Unable to Assess Affect (typically observed): Unable to Assess Orientation: : Oriented to Self, Oriented to Place, Oriented to  Time, Oriented to Situation Alcohol / Substance Use: Not Applicable Psych Involvement: No (comment)  Admission diagnosis:  Pericardial effusion [I31.39] Patient Active Problem List   Diagnosis Date Noted   Essential hypertension 05/29/2021   Hypothyroid 05/29/2021   Dyslipidemia 05/29/2021   Pericardial effusion 05/29/2021   Acute CHF ([place]) 05/26/2021   Chronic kidney disease, stage  IV (severe) ([place]) 11/24/2019   Hyperkalemia 11/24/2019   AAA (abdominal aortic aneurysm) without rupture 11/11/2012   Fx lateral malleolus-closed 10/21/2011   CALCANEAL FRACTURE, LEFT 01/18/2010   PCP:  [name], MD Pharmacy:   [facility] [address] [place] [address] Phone: [phone] Fax: [phone]     Social Determinants of Health (SDOH) Interventions    Readmission Risk Interventions No flowsheet data found.

## 2021-08-03 NOTE — Progress Notes (Signed)
8:20 am Right side JP drain removed per order. Site is clean dry and intact. Patient tolerated well.

## 2021-08-03 NOTE — Progress Notes (Addendum)
[address]       [place] [address]             [phone]      4 Days Post-Op Procedure(s) (LRB): XI ROBOTIC ASSISTED THORACOSCOPY PERICARDIAL WINDOW (Right) Subjective: No c/o  Objective: Vital signs in last 24 hours: Temp:  [97.8 F (36.6 C)-98.9 F (37.2 C)] 97.8 F (36.6 C) (12/02 0402) Pulse Rate:  [71-109] 85 (12/02 0402) Cardiac Rhythm: Normal sinus rhythm (12/01 1909) Resp:  [16-34] 17 (12/02 0402) BP: (77-159)/(57-104) 134/98 (12/02 0402) SpO2:  [88 %-97 %] 97 % (12/02 0402)  Hemodynamic parameters for last 24 hours:    Intake/Output from previous day: 12/01 0701 - 12/02 0700 In: 1104.5 [P.O.:960; I.V.:144.5] Out: 1120 [Urine:1050; Drains:40; Chest Tube:30] Intake/Output this shift: No intake/output data recorded.  General appearance: alert, cooperative, and no distress Heart: regular rate and rhythm Lungs: clear to auscultation bilaterally Abdomen: benign Extremities: no edema or calf tenderness Wound: incis healing well  Lab Results: Recent Labs    08/01/21 0045  WBC 9.5  HGB 13.2  HCT 40.7  PLT 166   BMET:  Recent Labs    08/01/21 0045  NA 138  K 4.6  CL 108  CO2 23  GLUCOSE 120*  BUN 32*  CREATININE 2.47*  CALCIUM 9.2    PT/INR: No results for input(s): LABPROT, INR in the last 72 hours. ABG    Component Value Date/Time   PHART 7.334 (L) 07/27/2021 1225   HCO3 20.5 07/27/2021 1225   TCO2 12.7 09/24/2007 1200   ACIDBASEDEF 4.3 (H) 07/27/2021 1225   O2SAT 97.5 07/27/2021 1225   CBG (last 3)  No results for input(s): GLUCAP in the last 72 hours.  Meds Scheduled Meds:  acetaminophen  1,000 mg Oral Q6H   Or   acetaminophen (TYLENOL) oral liquid 160 mg/5 mL  1,000 mg Oral Q6H   amLODipine  10 mg Oral Daily   aspirin  81 mg Oral Daily   atorvastatin  40 mg Oral Daily   bisacodyl  10 mg Oral Daily   calcitRIOL  0.25 mcg Oral Q M,W,F   cholecalciferol  1,000 Units Oral Daily   cloNIDine  0.1 mg Oral  BID   dorzolamide-timolol  1 drop Left Eye BID   enoxaparin (LOVENOX) injection  30 mg Subcutaneous Daily   furosemide  40 mg Oral Once per day on Wed Fri   hydrALAZINE  25 mg Oral Q8H   labetalol  300 mg Oral BID   latanoprost  1 drop Both Eyes QHS   levothyroxine  125 mcg Oral QAC breakfast   olopatadine  1 drop Both Eyes Daily   omega-3 acid ethyl esters  1 g Oral Daily   pantoprazole  40 mg Oral Daily   phenytoin  300 mg Oral QHS   senna-docusate  1 tablet Oral QHS   sodium bicarbonate  650 mg Oral BID   sodium zirconium cyclosilicate  5 g Oral Once per day on Mon Thu   cyanocobalamin  1,000 mcg Oral Daily   Continuous Infusions:  diltiazem (CARDIZEM) infusion 5 mg/hr (08/03/21 0402)   PRN Meds:.labetalol, morphine injection, ondansetron (ZOFRAN) IV, traMADol  Xrays No results found.  Assessment/Plan: S/P Procedure(s) (LRB): XI ROBOTIC ASSISTED THORACOSCOPY PERICARDIAL WINDOW (Right)  1 BP control improved but variable, S BP 77 -159. Back in sinus rhythm- will d/c cardizem gtt, only on 5 mg /hr 2 sats good on RA 3 40 cc from  drain - will remove today 4 no new labs or XRAYs 5 will see final recs for HTN meds from hospitalist and hopefully get bact to SNF later today   LOS: 4 days    [name] PA-C Pager [phone] 08/03/2021

## 2021-08-03 NOTE — Care Management Important Message (Signed)
Important Message  Patient Details  Name: Kurt Nelson MRN: [id number] Date of Birth: 05/27/1956   Medicare Important Message Given:  Yes     Shelda Altes 08/03/2021, 12:58 PM

## 2021-08-03 NOTE — Plan of Care (Signed)

## 2021-08-03 NOTE — TOC Transition Note (Signed)
Transition of Care [facility]) - CM/SW Discharge Note   Patient Details  Name: Kurt Nelson MRN: [id number] Date of Birth: 1955-10-15  Transition of Care [facility]) CM/SW Contact:  Tresa Endo Phone Number: 08/03/2021, 12:15 PM   Clinical Narrative:    Patient will DC to: [facility] Anticipated DC date: 08/03/2021 Family notified: Pt daughter Transport by: Pt daughter   Per MD patient ready for DC to [facility]. RN to call report prior to discharge [phone]). RN, patient, patient's family, and facility notified of DC. Discharge Summary and FL2 sent to facility. DC packet on chart.   CSW will sign off for now as social work intervention is no longer needed. Please consult [place] again if new needs arise.     Final next level of care: Assisted Living Barriers to Discharge: Barriers Resolved   Patient Goals and CMS Choice Patient states their goals for this hospitalization and ongoing recovery are:: Return to ALF CMS Medicare.gov Compare Post Acute Care list provided to:: Other (Comment Required) Choice offered to / list presented to : NA  Discharge Placement                       Discharge Plan and Services In-house Referral: Clinical Social Work   Post Acute Care Choice: NA                               Social Determinants of Health (SDOH) Interventions     Readmission Risk Interventions No flowsheet data found.

## 2021-08-03 NOTE — NC FL2 (Signed)
[place] LEVEL OF CARE SCREENING TOOL     IDENTIFICATION  Patient Name: Kurt Nelson Birthdate: 1956/07/16 Sex: male Admission Date (Current Location): 07/30/2021  [facility] and [place] Number:  Herbalist and Address:  [facility] [place] : [facility], [place] [address], [place], [place] [address]      Provider Number: [id number]  Attending Physician Name and Address:  Lajuana Matte, MD  Relative Name and Phone Number:  Jefm Miles)   [phone]    Current Level of Care: Hospital Recommended Level of Care: Assisted Living Facility Prior Approval Number:    Date Approved/Denied:   PASRR Number:    Discharge Plan: Other (Comment) [facility])    Current Diagnoses: Patient Active Problem List   Diagnosis Date Noted   Essential hypertension 05/29/2021   Hypothyroid 05/29/2021   Dyslipidemia 05/29/2021   Pericardial effusion 05/29/2021   Acute CHF ([facility]) 05/26/2021   Chronic kidney disease, stage IV (severe) ([place]) 11/24/2019   Hyperkalemia 11/24/2019   AAA (abdominal aortic aneurysm) without rupture 11/11/2012   Fx lateral malleolus-closed 10/21/2011   CALCANEAL FRACTURE, LEFT 01/18/2010    Orientation RESPIRATION BLADDER Height & Weight     Place, Situation, Time, Self  Normal Continent Weight: 127 lb (57.6 kg) Height:  5\' 7"  (170.2 cm)  BEHAVIORAL SYMPTOMS/MOOD NEUROLOGICAL BOWEL NUTRITION STATUS      Continent Diet (See DC Summary)  AMBULATORY STATUS COMMUNICATION OF NEEDS Skin   Limited Assist Verbally Normal                       Personal Care Assistance Level of Assistance  Feeding, Dressing, Bathing Bathing Assistance: Limited assistance Feeding assistance: Independent Dressing Assistance: Limited assistance     Functional Limitations Info  Speech, Hearing, Sight Sight Info: Adequate Hearing Info: Adequate Speech Info: Adequate    SPECIAL CARE FACTORS FREQUENCY                        Contractures Contractures Info: Not present    Additional Factors Info  Allergies, Code Status Code Status Info: Full Allergies Info: Penicillins           Current Medications (08/03/2021):  This is the current hospital active medication list Current Facility-Administered Medications  Medication Dose Route Frequency Provider Last Rate Last Admin   acetaminophen (TYLENOL) tablet 1,000 mg  1,000 mg Oral Q6H Gold, Wayne E, PA-C   1,000 mg at 08/03/21 2426   Or   acetaminophen (TYLENOL) 160 MG/5ML solution 1,000 mg  1,000 mg Oral Q6H Gold, Wayne E, PA-C       amLODipine (NORVASC) tablet 10 mg  10 mg Oral Daily Gold, Wayne E, PA-C   10 mg at 08/03/21 0848   aspirin chewable tablet 81 mg  81 mg Oral Daily Gold, Wayne E, PA-C   81 mg at 08/03/21 0849   atorvastatin (LIPITOR) tablet 40 mg  40 mg Oral Daily Gold, Wayne E, PA-C   40 mg at 08/03/21 0848   bisacodyl (DULCOLAX) EC tablet 10 mg  10 mg Oral Daily Gold, Wayne E, PA-C   10 mg at 08/03/21 8341   calcitRIOL (ROCALTROL) capsule 0.25 mcg  0.25 mcg Oral Q M,W,F Gold, Wayne E, PA-C   0.25 mcg at 08/03/21 9622   cholecalciferol (VITAMIN D3) tablet 1,000 Units  1,000 Units Oral Daily Jadene Pierini E, PA-C   1,000 Units at 08/03/21 0849   cloNIDine (CATAPRES)  tablet 0.1 mg  0.1 mg Oral BID Charlynne Cousins, MD       dorzolamide-timolol (COSOPT) 22.3-6.8 MG/ML ophthalmic solution 1 drop  1 drop Left Eye BID Gold, Wayne E, PA-C   1 drop at 08/03/21 0847   enoxaparin (LOVENOX) injection 30 mg  30 mg Subcutaneous Daily Karren Cobble, RPH   30 mg at 08/03/21 0845   furosemide (LASIX) tablet 40 mg  40 mg Oral Once per day on Wed Fri Gold, Wayne E, PA-C   40 mg at 08/03/21 1157   hydrALAZINE (APRESOLINE) tablet 50 mg  50 mg Oral Q8H Charlynne Cousins, MD       labetalol (NORMODYNE) injection 10 mg  10 mg Intravenous Q2H PRN Roddenberry, Myron G, PA-C   10 mg at 08/01/21 1607   labetalol (NORMODYNE) tablet 300 mg  300 mg Oral BID Charlynne Cousins, MD   300 mg at 08/03/21 0849   latanoprost (XALATAN) 0.005 % ophthalmic solution 1 drop  1 drop Both Eyes QHS Gold, Wayne E, PA-C   1 drop at 08/02/21 2143   levothyroxine (SYNTHROID) tablet 125 mcg  125 mcg Oral QAC breakfast [name], PA-C   125 mcg at 08/03/21 2620   morphine 2 MG/ML injection 2 mg  2 mg Intravenous Q2H PRN Gold, Patrick Jupiter E, PA-C       olopatadine (PATANOL) 0.1 % ophthalmic solution 1 drop  1 drop Both Eyes Daily Gold, Wayne E, PA-C   1 drop at 08/03/21 0846   omega-3 acid ethyl esters (LOVAZA) capsule 1 g  1 g Oral Daily Gold, Patrick Jupiter E, PA-C   1 g at 08/03/21 0848   ondansetron (ZOFRAN) injection 4 mg  4 mg Intravenous Q6H PRN Gold, Wayne E, PA-C       pantoprazole (PROTONIX) EC tablet 40 mg  40 mg Oral Daily Gold, Wayne E, PA-C   40 mg at 08/03/21 0849   phenytoin (DILANTIN) ER capsule 300 mg  300 mg Oral QHS Gold, Wayne E, PA-C   300 mg at 08/02/21 2140   senna-docusate (Senokot-S) tablet 1 tablet  1 tablet Oral QHS Gold, Patrick Jupiter E, PA-C   1 tablet at 08/02/21 2139   sodium bicarbonate tablet 650 mg  650 mg Oral BID GoldWilder Glade, PA-C   650 mg at 08/03/21 0848   sodium zirconium cyclosilicate (LOKELMA) packet 5 g  5 g Oral Once per day on Mon Thu Gold, Wayne E, PA-C   5 g at 08/02/21 0900   traMADol (ULTRAM) tablet 50-100 mg  50-100 mg Oral Q6H PRN Jadene Pierini E, PA-C       vitamin B-12 (CYANOCOBALAMIN) tablet 1,000 mcg  1,000 mcg Oral Daily Gold, Wayne E, PA-C   1,000 mcg at 08/03/21 3559     Discharge Medications: Please see discharge summary for a list of discharge medications.  Relevant Imaging Results:  Relevant Lab Results:   Additional Information [phone] Both Covid Vaccines and booster  Reece Agar, LCSWA

## 2021-08-03 NOTE — Progress Notes (Signed)
[facility] PROGRESS NOTE    Progress Note  Kurt Nelson  VQM:[id number] DOB: [birth year] DOA: 07/30/2021 PCP: Kurt Gravel, MD     Brief Narrative:   Kurt Nelson is an 65 y.o. male 65 year old with past medical history of essential hypertension, dyslipidemia CVA anemia of chronic disease, chronic kidney disease stage III who presents with recurrent pleural effusion discharge in September for lower extremity edema echocardiogram showed large pericardial effusion underwent pericardiocentesis removed 2 L repeated 2D echo from 07/30/2021 showed large circumferential pericardial effusion admitted under Dr. Kipp Brood underwent robotic assisted pericardial window.  Since then the patient has been noted to have an elevated blood pressure chest x-ray showed right chest tube and pericardial drain    Assessment/Plan:   Pericardial effusion: Further management per cardiothoracic surgery. Drain removed today. Basic metabolic panels pending. Have consulted physical therapy to evaluate [facility].  Paroxysmal atrial fibrillation: Now off diltiazem drip acute on review labetalol in sinus rhythm.  Hypertensive urgency: Blood pressure significantly improved cont continue amlodipine, hydralazine, Lasix, clonidine and labetalol Of diltiazem drip. Blood pressure still elevated, will increase hydralazine. He will be discharged on amlodipine, hydralazine, Lasix, and labetalol.  Further titration as an outpatient. Will continue to follow at a distance.  Severe protein caloric malnutrition: Counseling.  DVT prophylaxis: lovenox Family Communication:none Status is: Inpatient    Code Status:     Code Status Orders  (From admission, onward)           Start     Ordered   07/30/21 1604  Full code  Continuous        07/30/21 1603           Code Status History     Date Active Date Inactive Code Status Order ID Comments User Context   05/26/2021 1349 06/01/2021 2113  Full Code [id number]  Heath Lark D, DO ED         IV Access:   Peripheral IV   Procedures and diagnostic studies:   No results found.   Medical Consultants:   None.   Subjective:    Kurt Nelson has no new complaints wants to go home as his birthday is coming up soon.  Objective:    Vitals:   08/02/21 2139 08/02/21 2320 08/03/21 0000 08/03/21 0402  BP: (!) 159/102 96/73 108/75 (!) 134/98  Pulse: 84 85 83 85  Resp:  18 16 17   Temp:  97.9 F (36.6 C)  97.8 F (36.6 C)  TempSrc:  Oral  Oral  SpO2:  97% 97% 97%  Weight:      Height:       SpO2: 97 %   Intake/Output Summary (Last 24 hours) at 08/03/2021 0918 Last data filed at 08/03/2021 0402 Gross per 24 hour  Intake 624.47 ml  Output 1080 ml  Net -455.53 ml    Filed Weights   07/30/21 0835  Weight: 57.6 kg    Exam: General exam: In no acute distress. Respiratory system: Good air movement and clear to auscultation. Cardiovascular system: S1 & S2 heard, RRR. No JVD. Gastrointestinal system: Abdomen is nondistended, soft and nontender.  Extremities: No pedal edema. Skin: No rashes, lesions or ulcers Psychiatry: Judgement and insight appear normal. Mood & affect appropriate.   Data Reviewed:    Labs: Basic Metabolic Panel: Recent Labs  Lab 07/27/21 1230 07/31/21 0325 08/01/21 0045  NA 136 137 138  K 4.6 4.7 4.6  CL 106 109 108  CO2 19* 22  23  GLUCOSE 102* 120* 120*  BUN 44* 30* 32*  CREATININE 2.50* 1.97* 2.47*  CALCIUM 9.0 9.0 9.2    GFR Estimated Creatinine Clearance: 24.3 mL/min (A) (by C-G formula based on SCr of 2.47 mg/dL (H)). Liver Function Tests: Recent Labs  Lab 07/27/21 1230 08/01/21 0045  AST 21 18  ALT 22 20  ALKPHOS 180* 166*  BILITOT 0.4 0.6  PROT 7.2 7.0  ALBUMIN 3.5 3.1*    No results for input(s): LIPASE, AMYLASE in the last 168 hours. No results for input(s): AMMONIA in the last 168 hours. Coagulation profile Recent Labs  Lab 07/27/21 1230  INR  1.0    COVID-19 Labs  No results for input(s): DDIMER, FERRITIN, LDH, CRP in the last 72 hours.  Lab Results  Component Value Date   SARSCOV2NAA NEGATIVE 07/27/2021   [place] NEGATIVE 06/01/2021   [place] NEGATIVE 05/26/2021    CBC: Recent Labs  Lab 07/27/21 1230 07/31/21 0325 08/01/21 0045  WBC 6.5 9.9 9.5  HGB 12.4* 12.0* 13.2  HCT 38.7* 37.3* 40.7  MCV 87.2 85.9 86.2  PLT 189 174 166    Cardiac Enzymes: No results for input(s): CKTOTAL, CKMB, CKMBINDEX, TROPONINI in the last 168 hours. BNP (last 3 results) No results for input(s): PROBNP in the last 8760 hours. CBG: No results for input(s): GLUCAP in the last 168 hours. D-Dimer: No results for input(s): DDIMER in the last 72 hours. Hgb A1c: No results for input(s): HGBA1C in the last 72 hours. Lipid Profile: No results for input(s): CHOL, HDL, LDLCALC, TRIG, CHOLHDL, LDLDIRECT in the last 72 hours. Thyroid function studies: No results for input(s): TSH, T4TOTAL, T3FREE, THYROIDAB in the last 72 hours.  Invalid input(s): FREET3 Anemia work up: No results for input(s): VITAMINB12, FOLATE, FERRITIN, TIBC, IRON, RETICCTPCT in the last 72 hours. Sepsis Labs: Recent Labs  Lab 07/27/21 1230 07/31/21 0325 08/01/21 0045  WBC 6.5 9.9 9.5    Microbiology Recent Results (from the past 240 hour(s))  SARS CORONAVIRUS 2 (TAT 6-24 HRS) Nasopharyngeal Nasopharyngeal Swab     Status: None   Collection Time: 07/27/21 11:59 AM   Specimen: Nasopharyngeal Swab  Result Value Ref Range Status   SARS Coronavirus 2 NEGATIVE NEGATIVE Final    Comment: (NOTE) SARS-CoV-2 target nucleic acids are NOT DETECTED.  The SARS-CoV-2 RNA is generally detectable in upper and lower respiratory specimens during the acute phase of infection. Negative results do not preclude SARS-CoV-2 infection, do not rule out co-infections with other pathogens, and should not be used as the sole basis for treatment or other patient management  decisions. Negative results must be combined with clinical observations, patient history, and epidemiological information. The expected result is Negative.  Fact Sheet for Patients: SugarRoll.be  Fact Sheet for Healthcare Providers: https://www.woods-mathews.com/  This test is not yet approved or cleared by the [place] FDA and  has been authorized for detection and/or diagnosis of SARS-CoV-2 by FDA under an Emergency Use Authorization (EUA). This EUA will remain  in effect (meaning this test can be used) for the duration of the COVID-19 declaration under Se ction 564(b)(1) of the Act, 21 U.S.C. section 360bbb-3(b)(1), unless the authorization is terminated or revoked sooner.  Performed at [facility], [place] [address], [place], [place] [address]   Surgical pcr screen     Status: None   Collection Time: 07/27/21 11:59 AM   Specimen: Nasal Mucosa; Nasal Swab  Result Value Ref Range Status   MRSA, PCR NEGATIVE NEGATIVE  Final   Staphylococcus aureus NEGATIVE NEGATIVE Final    Comment: (NOTE) The Xpert SA Assay (FDA approved for NASAL specimens in patients 60 years of age and older), is one component of a comprehensive surveillance program. It is not intended to diagnose infection nor to guide or monitor treatment. Performed at [facility], [place] [address] [place], [place] [address]      Medications:    acetaminophen  1,000 mg Oral Q6H   Or   acetaminophen (TYLENOL) oral liquid 160 mg/5 mL  1,000 mg Oral Q6H   amLODipine  10 mg Oral Daily   aspirin  81 mg Oral Daily   atorvastatin  40 mg Oral Daily   bisacodyl  10 mg Oral Daily   calcitRIOL  0.25 mcg Oral Q M,W,F   cholecalciferol  1,000 Units Oral Daily   cloNIDine  0.1 mg Oral BID   dorzolamide-timolol  1 drop Left Eye BID   enoxaparin (LOVENOX) injection  30 mg Subcutaneous Daily   furosemide  40 mg Oral Once per day on Wed Fri   hydrALAZINE  25 mg Oral Q8H    labetalol  300 mg Oral BID   latanoprost  1 drop Both Eyes QHS   levothyroxine  125 mcg Oral QAC breakfast   olopatadine  1 drop Both Eyes Daily   omega-3 acid ethyl esters  1 g Oral Daily   pantoprazole  40 mg Oral Daily   phenytoin  300 mg Oral QHS   senna-docusate  1 tablet Oral QHS   sodium bicarbonate  650 mg Oral BID   sodium zirconium cyclosilicate  5 g Oral Once per day on Mon Thu   cyanocobalamin  1,000 mcg Oral Daily   Continuous Infusions:      LOS: 4 days   Charlynne Cousins  [facility]  08/03/2021, 9:18 AM

## 2021-08-03 NOTE — Progress Notes (Signed)
Agree with prior assessment from [facility]

## 2021-08-06 ENCOUNTER — Other Ambulatory Visit (HOSPITAL_COMMUNITY): Payer: Self-pay

## 2021-08-08 ENCOUNTER — Other Ambulatory Visit (HOSPITAL_COMMUNITY): Payer: Self-pay

## 2021-08-16 ENCOUNTER — Other Ambulatory Visit: Payer: Self-pay | Admitting: Thoracic Surgery (Cardiothoracic Vascular Surgery)

## 2021-08-16 DIAGNOSIS — I3139 Other pericardial effusion (noninflammatory): Secondary | ICD-10-CM

## 2021-08-17 ENCOUNTER — Ambulatory Visit
Admission: RE | Admit: 2021-08-17 | Discharge: 2021-08-17 | Disposition: A | Discharge status: Home / Self Care | Payer: Medicare Other | Source: Ambulatory Visit | Attending: Thoracic Surgery (Cardiothoracic Vascular Surgery) | Admitting: Thoracic Surgery (Cardiothoracic Vascular Surgery)

## 2021-08-17 ENCOUNTER — Ambulatory Visit (INDEPENDENT_AMBULATORY_CARE_PROVIDER_SITE_OTHER): Payer: Self-pay | Admitting: Thoracic Surgery (Cardiothoracic Vascular Surgery)

## 2021-08-17 ENCOUNTER — Other Ambulatory Visit: Payer: Self-pay

## 2021-08-17 VITALS — BP 165/100 | HR 79 | Resp 20 | Ht 67.0 in | Wt 123.0 lb

## 2021-08-17 DIAGNOSIS — I3139 Other pericardial effusion (noninflammatory): Secondary | ICD-10-CM

## 2021-08-17 DIAGNOSIS — Z09 Encounter for follow-up examination after completed treatment for conditions other than malignant neoplasm: Secondary | ICD-10-CM

## 2021-08-20 NOTE — Progress Notes (Signed)
° °   °  [address]       [place] [address]             [phone]        Kurt Nelson [place] Medical Record #[id number] Date of Birth: 10/25/1955  Referring: Kurt Nelson., NP Primary Care: Kurt Gravel, MD Primary Cardiologist:Kurt Domenic Polite, MD  Reason for visit:   follow-up  History of Present Illness:     Kurt Nelson presents for his first follow-up appointment.  He is status post robotic assisted pericardial window.  He has no complaints  Physical Exam: BP (!) 165/100    Pulse 79    Resp 20    Ht 5\' 7"  (1.702 m)    Wt 123 lb (55.8 kg)    SpO2 99% Comment: RA   BMI 19.26 kg/m   Alert NAD Incision clean.   Abdomen soft, ND No peripheral edema   Diagnostic Studies & Laboratory data: CXR: Small bilateral effusions Path:  FINAL MICROSCOPIC DIAGNOSIS:   A. PERICARDIUM, BIOPSY:  -  Pericardium with chronic inflammation and reactive mesothelial cells  -  See comment    Assessment / Plan:   65 year old male status post robotic assisted pericardial window for chronic pericardial effusion.  Chest x-ray shows small bilateral effusions.  He reports no respiratory symptoms.  He will follow-up in 1 month with a chest x-ray.   Kurt Nelson 08/20/2021 1:22 PM

## 2021-10-17 ENCOUNTER — Encounter: Payer: Self-pay | Admitting: Cardiology

## 2021-10-17 ENCOUNTER — Ambulatory Visit (INDEPENDENT_AMBULATORY_CARE_PROVIDER_SITE_OTHER): Payer: Medicare Other | Admitting: Cardiology

## 2021-10-17 VITALS — BP 152/88 | HR 69 | Wt 129.8 lb

## 2021-10-17 DIAGNOSIS — I10 Essential (primary) hypertension: Secondary | ICD-10-CM | POA: Diagnosis not present

## 2021-10-17 DIAGNOSIS — I3139 Other pericardial effusion (noninflammatory): Secondary | ICD-10-CM | POA: Diagnosis not present

## 2021-10-17 NOTE — Progress Notes (Signed)
Cardiology Office Note  Date: 10/17/2021   ID: Kurt Nelson, DOB 1955-09-22, MRN [id number]  PCP:  [name], MD  Cardiologist:  Rozann Lesches, MD Electrophysiologist:  None   Chief Complaint  Patient presents with   Cardiac follow-up    History of Present Illness: Kurt Nelson is a 66 y.o. male last seen in November 2022 by Mr. Leonides Sake NP.  Interval evaluation by Dr. Kipp Brood noted, I reviewed the note from December 2022.  He is status post robot-assisted pericardial window for recurrent pericardial effusion.  He is here today with an Environmental consultant from [facility].  He does not report any significant shortness of breath with typical activities at his facility, no chest pain or palpitations.  He uses a walker to ambulate.  No falls.  He is on multimodal antihypertensive therapy with follow-up by provider at his facility.  Past Medical History:  Diagnosis Date   AAA (abdominal aortic aneurysm)    Anemia, normocytic normochromic    CKD (chronic kidney disease) stage 3, GFR 30-59 ml/min (HCC)    Cocaine abuse (HCC)    ETOH abuse    GERD (gastroesophageal reflux disease)    Hyperglycemia    Hypertension    Hypothyroidism    ICH (intracerebral hemorrhage) (HCC)    Seizures ([place])    Stroke [facility])     Past Surgical History:  Procedure Laterality Date   EYE SURGERY     bilateral cataracts   PERICARDIOCENTESIS N/A 05/30/2021   Procedure: PERICARDIOCENTESIS;  Surgeon: Wellington Hampshire, MD;  Location: [facility];  Service: Cardiovascular;  Laterality: N/A;   XI ROBOTIC ASSISTED PERICARDIAL WINDOW Right 07/30/2021   Procedure: XI ROBOTIC ASSISTED THORACOSCOPY PERICARDIAL WINDOW;  Surgeon: Lajuana Matte, MD;  Location: [facility] OR;  Service: Thoracic;  Laterality: Right;    Current Outpatient Medications  Medication Sig Dispense Refill   amLODipine (NORVASC) 10 MG tablet Take 10 mg by mouth daily.     aspirin 81 MG tablet Take 81 mg by mouth daily.      atorvastatin (LIPITOR) 40 MG tablet Take 40 mg by mouth daily.     calcitRIOL (ROCALTROL) 0.25 MCG capsule Take 0.25 mcg by mouth every Monday, Wednesday, and Friday.     Cholecalciferol (VITAMIN D3) 25 MCG (1000 UT) CAPS Take 1 capsule by mouth daily.     cloNIDine (CATAPRES) 0.1 MG tablet Take 1 tablet (0.1 mg total) by mouth 2 (two) times daily. 60 tablet 1   cyanocobalamin 1000 MCG tablet Take 1 tablet (1,000 mcg total) by mouth daily. 4 tablet 0   dorzolamide-timolol (COSOPT) 22.3-6.8 MG/ML ophthalmic solution Place 1 drop into the left eye 2 (two) times daily.     fish oil-omega-3 fatty acids 1000 MG capsule Take 1 g by mouth in the morning and at bedtime.     folic acid (FOLVITE) 1 MG tablet Take 1 mg by mouth daily.     furosemide (LASIX) 40 MG tablet Take 40 mg by mouth See admin instructions. Take 40 mg by mouth on Wednesday and Friday.     hydrALAZINE (APRESOLINE) 50 MG tablet Take 1 tablet (50 mg total) by mouth every 8 (eight) hours. 90 tablet 1   labetalol (NORMODYNE) 300 MG tablet Take 1 tablet (300 mg total) by mouth 2 (two) times daily. 60 tablet 1   levothyroxine (SYNTHROID) 125 MCG tablet Take 125 mcg by mouth daily before breakfast.     LUMIGAN 0.01 % SOLN Place  1 drop into both eyes at bedtime.     olopatadine (PATANOL) 0.1 % ophthalmic solution Place 1 drop into both eyes daily.     omeprazole (PRILOSEC) 20 MG capsule Take 20 mg by mouth daily.     phenytoin (DILANTIN) 100 MG ER capsule Take 300 mg by mouth at bedtime.      sodium bicarbonate 650 MG tablet Take 650 mg by mouth 2 (two) times daily.     sodium zirconium cyclosilicate (LOKELMA) 5 g packet Take 5 g by mouth See admin instructions. Take 5 g by mouth on Monday and Thursday     valsartan (DIOVAN) 40 MG tablet Take 40 mg by mouth daily.     No current facility-administered medications for this visit.   Allergies:  Penicillins   ROS: No syncope.  Physical Exam: VS:  BP (!) 152/88    Pulse 69    Wt 129 lb  12.8 oz (58.9 kg)    SpO2 97%    BMI 20.33 kg/m , BMI Body mass index is 20.33 kg/m.  Wt Readings from Last 3 Encounters:  10/17/21 129 lb 12.8 oz (58.9 kg)  08/17/21 123 lb (55.8 kg)  07/30/21 127 lb (57.6 kg)    General: Patient appears comfortable at rest. HEENT: Conjunctiva and lids normal. Neck: Supple, no elevated JVP or carotid bruits, no thyromegaly. Lungs: Clear to auscultation, nonlabored breathing at rest. Cardiac: Regular rate and rhythm, no S3, 1/6 systolic murmur, no pericardial rub. Extremities: No pitting edema.  ECG:  An ECG dated 08/01/2021 was personally reviewed today and demonstrated:  Atrial fibrillation with RVR and repolarization abnormalities.  Recent Labwork: 05/26/2021: B Natriuretic Peptide 954.0 05/29/2021: Magnesium 1.8 08/01/2021: ALT 20; AST 18; Hemoglobin 13.2; Platelets 166 08/03/2021: BUN 56; Creatinine, Ser 2.85; Potassium 4.2; Sodium 137   Other Studies Reviewed Today:  Chest x-ray 08/17/2021: FINDINGS: Cardiac contours are upper limits of normal in size. Mild tortuosity of the thoracic aorta, unchanged compared to prior exam. Interval removal of right-sided chest tube. Small bilateral pleural effusions and atelectasis. No evidence of pneumothorax.   IMPRESSION: Small bilateral pleural effusions and atelectasis.  Assessment and Plan:  1.  History of recurrent, large pericardial effusion initially managed with pericardiocentesis and ultimately a pericardial window as discussed above.  He has done well overall.  Plan is to obtain a limited echocardiogram with clinical reevaluation in 6 months.  Likelihood of recurrence remains, but is fairly small at this point.  2.  Essential hypertension on multimodal therapy.  Systolic is in the 094B today.  He is currently on Norvasc, clonidine, hydralazine, and Diovan with follow-up by facility provider.  Medication Adjustments/Labs and Tests Ordered: Current medicines are reviewed at length with the  patient today.  Concerns regarding medicines are outlined above.   Tests Ordered: Orders Placed This Encounter  Procedures   ECHOCARDIOGRAM LIMITED    Medication Changes: No orders of the defined types were placed in this encounter.   Disposition:  Follow up  6 months.  Signed, Satira Sark, MD, [facility] 10/17/2021 2:46 PM    [facility] [place], [place], [place] [address] Phone: [phone]; Fax: [phone]

## 2021-10-17 NOTE — Patient Instructions (Signed)
Medication Instructions:  Your physician recommends that you continue on your current medications as directed. Please refer to the Current Medication list given to you today.   Labwork: None  Testing/Procedures: Your physician has requested that you have a limited echocardiogram in 6 months Echocardiography is a painless test that uses sound waves to create images of your heart. It provides your doctor with information about the size and shape of your heart and how well your hearts chambers and valves are working. This procedure takes approximately one hour. There are no restrictions for this procedure.   Follow-Up: Your physician recommends that you schedule a follow-up appointment in: 6 months  Any Other Special Instructions Will Be Listed Below (If Applicable).  If you need a refill on your cardiac medications before your next appointment, please call your pharmacy.

## 2022-01-07 ENCOUNTER — Other Ambulatory Visit: Payer: Self-pay

## 2022-01-07 DIAGNOSIS — I77811 Abdominal aortic ectasia: Secondary | ICD-10-CM

## 2022-01-17 ENCOUNTER — Ambulatory Visit: Payer: Medicare Other

## 2022-01-17 ENCOUNTER — Inpatient Hospital Stay (HOSPITAL_COMMUNITY): Admission: RE | Admit: 2022-01-17 | Payer: Medicare Other | Source: Ambulatory Visit

## 2022-01-24 ENCOUNTER — Other Ambulatory Visit: Payer: Self-pay | Admitting: Nephrology

## 2022-01-24 ENCOUNTER — Other Ambulatory Visit (HOSPITAL_COMMUNITY): Payer: Self-pay | Admitting: Nephrology

## 2022-01-24 DIAGNOSIS — N170 Acute kidney failure with tubular necrosis: Secondary | ICD-10-CM

## 2022-01-24 DIAGNOSIS — I129 Hypertensive chronic kidney disease with stage 1 through stage 4 chronic kidney disease, or unspecified chronic kidney disease: Secondary | ICD-10-CM

## 2022-01-24 DIAGNOSIS — R809 Proteinuria, unspecified: Secondary | ICD-10-CM

## 2022-01-24 DIAGNOSIS — N184 Chronic kidney disease, stage 4 (severe): Secondary | ICD-10-CM

## 2022-02-05 ENCOUNTER — Ambulatory Visit (HOSPITAL_COMMUNITY)
Admission: RE | Admit: 2022-02-05 | Discharge: 2022-02-05 | Disposition: A | Discharge status: Home / Self Care | Payer: Medicare Other | Source: Ambulatory Visit | Attending: Nephrology | Admitting: Nephrology

## 2022-02-05 DIAGNOSIS — R809 Proteinuria, unspecified: Secondary | ICD-10-CM | POA: Insufficient documentation

## 2022-02-05 DIAGNOSIS — N170 Acute kidney failure with tubular necrosis: Secondary | ICD-10-CM | POA: Insufficient documentation

## 2022-02-05 DIAGNOSIS — D631 Anemia in chronic kidney disease: Secondary | ICD-10-CM | POA: Insufficient documentation

## 2022-02-05 DIAGNOSIS — I129 Hypertensive chronic kidney disease with stage 1 through stage 4 chronic kidney disease, or unspecified chronic kidney disease: Secondary | ICD-10-CM | POA: Insufficient documentation

## 2022-02-05 DIAGNOSIS — N184 Chronic kidney disease, stage 4 (severe): Secondary | ICD-10-CM | POA: Insufficient documentation

## 2022-02-21 ENCOUNTER — Encounter: Payer: Self-pay | Admitting: Vascular Surgery

## 2022-02-21 ENCOUNTER — Ambulatory Visit (HOSPITAL_COMMUNITY)
Admission: RE | Admit: 2022-02-21 | Discharge: 2022-02-21 | Disposition: A | Discharge status: Home / Self Care | Payer: Medicare Other | Source: Ambulatory Visit | Attending: Vascular Surgery | Admitting: Vascular Surgery

## 2022-02-21 ENCOUNTER — Ambulatory Visit (INDEPENDENT_AMBULATORY_CARE_PROVIDER_SITE_OTHER): Payer: Medicare Other | Admitting: Vascular Surgery

## 2022-02-21 VITALS — BP 158/82 | HR 63 | Temp 97.9°F | Resp 20 | Ht 67.0 in | Wt 130.0 lb

## 2022-02-21 DIAGNOSIS — I77811 Abdominal aortic ectasia: Secondary | ICD-10-CM | POA: Insufficient documentation

## 2022-02-21 DIAGNOSIS — I7143 Infrarenal abdominal aortic aneurysm, without rupture: Secondary | ICD-10-CM

## 2022-02-21 NOTE — Progress Notes (Signed)
REASON FOR VISIT:   Follow-up of abdominal aortic aneurysm.  MEDICAL ISSUES:   ABDOMINAL AORTIC ANEURYSM: This patient has a very small, 3.1 cm, infrarenal abdominal aortic aneurysm.  This has been stable in size.  He understands we would not consider elective repair unless it reached 5.5 cm in maximum diameter.  He knows to keep his blood pressure under good control.  It was slightly elevated today in the office.  We also discussed the importance of tobacco cessation and I explained that continued tobacco use does increase his risk of aneurysm expansion.  Given that the aneurysm has been stable in size I think we can stretch his follow-up out to 3 years.  I have ordered a follow-up duplex scan and an office visit on the PA schedule in 3 years.  He knows to call sooner if he has problems.   HPI:   Kurt Nelson is a pleasant 66 y.o. male who we have been following with some ectasia of the abdominal aorta.  The patient was last seen by Marlinda Mike, PA on 01/17/2021.  At that time, the maximum diameter of the infrarenal aorta was 3.1 cm.  The right common iliac artery measured 2.1 cm in maximum diameter.  He comes in for a 1 year follow-up visit.  He comes in for routine follow-up visit.  He denies any abdominal or back pain.  There have been no significant changes in his medical history.  He does continue to smoke about a quarter of a pack per day.  He is on aspirin and is on a statin.  His activity is very limited because of his previous stroke.  I do not get any history of claudication or rest pain.  Past Medical History:  Diagnosis Date   AAA (abdominal aortic aneurysm) (HCC)    Anemia, normocytic normochromic    CKD (chronic kidney disease) stage 3, GFR 30-59 ml/min (HCC)    Cocaine abuse (HCC)    ETOH abuse    GERD (gastroesophageal reflux disease)    Hyperglycemia    Hypertension    Hypothyroidism    ICH (intracerebral hemorrhage) (HCC)    Seizures ([place])    Stroke ([place])      Family History  Problem Relation Age of Onset   Hypertension Mother    Hypertension Sister    Hypertension Sister     SOCIAL HISTORY: Social History   Tobacco Use   Smoking status: Light Smoker    Packs/day: 0.25    Types: Cigarettes   Smokeless tobacco: Never  Substance Use Topics   Alcohol use: Not Currently    Allergies  Allergen Reactions   Penicillins Itching and Rash    Current Outpatient Medications  Medication Sig Dispense Refill   amLODipine (NORVASC) 10 MG tablet Take 10 mg by mouth daily.     aspirin 81 MG tablet Take 81 mg by mouth daily.     atorvastatin (LIPITOR) 40 MG tablet Take 40 mg by mouth daily.     calcitRIOL (ROCALTROL) 0.25 MCG capsule Take 0.25 mcg by mouth every Monday, Wednesday, and Friday.     Cholecalciferol (VITAMIN D3) 25 MCG (1000 UT) CAPS Take 1 capsule by mouth daily.     cloNIDine (CATAPRES) 0.1 MG tablet Take 1 tablet (0.1 mg total) by mouth 2 (two) times daily. 60 tablet 1   cyanocobalamin 1000 MCG tablet Take 1 tablet (1,000 mcg total) by mouth daily. 4 tablet 0   dorzolamide-timolol (COSOPT) 22.3-6.8 MG/ML ophthalmic solution  Place 1 drop into the left eye 2 (two) times daily.     fish oil-omega-3 fatty acids 1000 MG capsule Take 1 g by mouth in the morning and at bedtime.     folic acid (FOLVITE) 1 MG tablet Take 1 mg by mouth daily.     furosemide (LASIX) 40 MG tablet Take 40 mg by mouth See admin instructions. Take 40 mg by mouth on Wednesday and Friday.     hydrALAZINE (APRESOLINE) 50 MG tablet Take 1 tablet (50 mg total) by mouth every 8 (eight) hours. 90 tablet 1   labetalol (NORMODYNE) 300 MG tablet Take 1 tablet (300 mg total) by mouth 2 (two) times daily. 60 tablet 1   levothyroxine (SYNTHROID) 125 MCG tablet Take 125 mcg by mouth daily before breakfast.     LUMIGAN 0.01 % SOLN Place 1 drop into both eyes at bedtime.     olopatadine (PATANOL) 0.1 % ophthalmic solution Place 1 drop into both eyes daily.     omeprazole  (PRILOSEC) 20 MG capsule Take 20 mg by mouth daily.     phenytoin (DILANTIN) 100 MG ER capsule Take 300 mg by mouth at bedtime.      sodium bicarbonate 650 MG tablet Take 650 mg by mouth 2 (two) times daily.     sodium zirconium cyclosilicate (LOKELMA) 5 g packet Take 5 g by mouth See admin instructions. Take 5 g by mouth on Monday and Thursday     valsartan (DIOVAN) 40 MG tablet Take 40 mg by mouth daily.     No current facility-administered medications for this visit.    REVIEW OF SYSTEMS:  [X]  denotes positive finding, [ ]  denotes negative finding Cardiac  Comments:  Chest pain or chest pressure:    Shortness of breath upon exertion:    Short of breath when lying flat:    Irregular heart rhythm:        Vascular    Pain in calf, thigh, or hip brought on by ambulation:    Pain in feet at night that wakes you up from your sleep:     Blood clot in your veins:    Leg swelling:         Pulmonary    Oxygen at home:    Productive cough:     Wheezing:         Neurologic    Sudden weakness in arms or legs:  x   Sudden numbness in arms or legs:  x   Sudden onset of difficulty speaking or slurred speech:    Temporary loss of vision in one eye:     Problems with dizziness:         Gastrointestinal    Blood in stool:     Vomited blood:         Genitourinary    Burning when urinating:     Blood in urine:        Psychiatric    Major depression:         Hematologic    Bleeding problems:    Problems with blood clotting too easily:        Skin    Rashes or ulcers:        Constitutional    Fever or chills:     PHYSICAL EXAM:   Vitals:   02/21/22 0849  BP: (!) 158/82  Pulse: 63  Resp: 20  Temp: 97.9 F (36.6 C)  SpO2: 97%  Weight: 130 lb (59 kg)  Height: 5\' 7"  (1.702 m)    GENERAL: The patient is a well-nourished male, in no acute distress. The vital signs are documented above. CARDIAC: There is a regular rate and rhythm.  VASCULAR: I do not detect carotid  bruits. He has palpable femoral pulses. I cannot palpate pedal pulses.  Both feet are warm and well-perfused. He has no significant lower extremity swelling. PULMONARY: There is good air exchange bilaterally without wheezing or rales. ABDOMEN: Soft and non-tender with normal pitched bowel sounds.  Is difficult to palpate his small aneurysm. MUSCULOSKELETAL: There are no major deformities or cyanosis. NEUROLOGIC: No focal weakness or paresthesias are detected. SKIN: There are no ulcers or rashes noted. PSYCHIATRIC: The patient has a normal affect.  DATA:    DUPLEX ABDOMINAL AORTA: I have independently interpreted his duplex of the abdominal aorta today.  The maximum diameter of his aneurysm is 3.1 cm.  The right common iliac artery measures 2.2 cm in maximum diameter.  The left common iliac artery measures 1.1 cm in maximum diameter.  Deitra Mayo [facility] [phone]

## 2022-04-11 ENCOUNTER — Ambulatory Visit (INDEPENDENT_AMBULATORY_CARE_PROVIDER_SITE_OTHER): Payer: Medicare Other

## 2022-04-11 DIAGNOSIS — I3139 Other pericardial effusion (noninflammatory): Secondary | ICD-10-CM

## 2022-04-11 LAB — ECHOCARDIOGRAM LIMITED: S' Lateral: 2.42 cm

## 2022-04-24 ENCOUNTER — Encounter: Payer: Self-pay | Admitting: Cardiology

## 2022-04-24 ENCOUNTER — Ambulatory Visit (INDEPENDENT_AMBULATORY_CARE_PROVIDER_SITE_OTHER): Payer: Medicare Other | Admitting: Cardiology

## 2022-04-24 VITALS — BP 132/80 | HR 79 | Ht 67.0 in | Wt 135.4 lb

## 2022-04-24 DIAGNOSIS — I3139 Other pericardial effusion (noninflammatory): Secondary | ICD-10-CM | POA: Diagnosis not present

## 2022-04-24 DIAGNOSIS — I10 Essential (primary) hypertension: Secondary | ICD-10-CM

## 2022-04-24 NOTE — Patient Instructions (Addendum)
Medication Instructions:   Your physician recommends that you continue on your current medications as directed. Please refer to the Current Medication list given to you today.  Labwork:  none  Testing/Procedures:  none  Follow-Up:  Your physician recommends that you schedule a follow-up appointment in: as needed.  Any Other Special Instructions Will Be Listed Below (If Applicable).  If you need a refill on your cardiac medications before your next appointment, please call your pharmacy. 

## 2022-04-24 NOTE — Progress Notes (Signed)
Cardiology Office Note  Date: 04/24/2022   ID: Kurt Nelson, Kurt Nelson 1955/09/03, MRN [id number]  PCP:  Megan Mans, NP  Cardiologist:  Rozann Lesches, MD Electrophysiologist:  None   Chief Complaint  Patient presents with   Cardiac follow-up    History of Present Illness: Kurt Nelson is a 66 y.o. male last seen in February.  He is here for a follow-up visit.  Continues to reside at [facility] assisted living.  He does not report any progressive shortness of breath, no chest pain or syncope.  He is using a walker as before.  Recent follow-up echocardiogram showed vigorous LVEF at 70 to 75% and resolution of previous pericardial effusion.  Overall very reassuring following pericardial window.  I reviewed his medications which are noted below.  Past Medical History:  Diagnosis Date   AAA (abdominal aortic aneurysm) (HCC)    Anemia, normocytic normochromic    CKD (chronic kidney disease) stage 3, GFR 30-59 ml/min (HCC)    Cocaine abuse (HCC)    ETOH abuse    GERD (gastroesophageal reflux disease)    Hyperglycemia    Hypertension    Hypothyroidism    ICH (intracerebral hemorrhage) (HCC)    Seizures ([place])    Stroke [facility])     Past Surgical History:  Procedure Laterality Date   EYE SURGERY     bilateral cataracts   PERICARDIOCENTESIS N/A 05/30/2021   Procedure: PERICARDIOCENTESIS;  Surgeon: Wellington Hampshire, MD;  Location: [place];  Service: Cardiovascular;  Laterality: N/A;   XI ROBOTIC ASSISTED PERICARDIAL WINDOW Right 07/30/2021   Procedure: XI ROBOTIC ASSISTED THORACOSCOPY PERICARDIAL WINDOW;  Surgeon: Lajuana Matte, MD;  Location: [facility] OR;  Service: Thoracic;  Laterality: Right;    Current Outpatient Medications  Medication Sig Dispense Refill   amLODipine (NORVASC) 10 MG tablet Take 10 mg by mouth daily.     aspirin 81 MG tablet Take 81 mg by mouth daily.     atorvastatin (LIPITOR) 40 MG tablet Take 40 mg by mouth daily.     calcitRIOL  (ROCALTROL) 0.25 MCG capsule Take 0.25 mcg by mouth every Monday, Wednesday, and Friday.     cloNIDine (CATAPRES) 0.1 MG tablet Take 1 tablet (0.1 mg total) by mouth 2 (two) times daily. 60 tablet 1   dorzolamide-timolol (COSOPT) 22.3-6.8 MG/ML ophthalmic solution Place 1 drop into the left eye 2 (two) times daily.     fish oil-omega-3 fatty acids 1000 MG capsule Take 1 g by mouth in the morning and at bedtime.     folic acid (FOLVITE) 1 MG tablet Take 1 mg by mouth daily.     furosemide (LASIX) 40 MG tablet Take 40 mg by mouth See admin instructions. Take 40 mg by mouth on Wednesday and Friday.     hydrALAZINE (APRESOLINE) 50 MG tablet Take 1 tablet (50 mg total) by mouth every 8 (eight) hours. 90 tablet 1   labetalol (NORMODYNE) 300 MG tablet Take 1 tablet (300 mg total) by mouth 2 (two) times daily. 60 tablet 1   levothyroxine (SYNTHROID) 125 MCG tablet Take 125 mcg by mouth daily before breakfast.     LUMIGAN 0.01 % SOLN Place 1 drop into both eyes at bedtime.     olopatadine (PATANOL) 0.1 % ophthalmic solution Place 1 drop into both eyes daily.     pantoprazole (PROTONIX) 20 MG tablet Take 20 mg by mouth daily.     phenytoin (DILANTIN) 100 MG ER capsule Take 300  mg by mouth at bedtime.      sodium bicarbonate 650 MG tablet Take 650 mg by mouth 2 (two) times daily.     sodium zirconium cyclosilicate (LOKELMA) 5 g packet Take 5 g by mouth See admin instructions. Take 5 g by mouth on Monday and Thursday     valsartan (DIOVAN) 40 MG tablet Take 40 mg by mouth daily.     Cholecalciferol (VITAMIN D3) 25 MCG (1000 UT) CAPS Take 1 capsule by mouth daily. (Patient not taking: Reported on 04/24/2022)     cyanocobalamin 1000 MCG tablet Take 1 tablet (1,000 mcg total) by mouth daily. (Patient not taking: Reported on 04/24/2022) 4 tablet 0   omeprazole (PRILOSEC) 20 MG capsule Take 20 mg by mouth daily. (Patient not taking: Reported on 04/24/2022)     No current facility-administered medications for this  visit.   Allergies:  Penicillins   ROS: No orthopnea or PND.  Physical Exam: VS:  BP 132/80 (BP Location: Left Arm, Patient Position: Sitting, Cuff Size: Normal)   Pulse 79   Ht 5\' 7"  (1.702 m)   Wt 135 lb 6.4 oz (61.4 kg)   SpO2 99%   BMI 21.21 kg/m , BMI Body mass index is 21.21 kg/m.  Wt Readings from Last 3 Encounters:  04/24/22 135 lb 6.4 oz (61.4 kg)  02/21/22 130 lb (59 kg)  10/17/21 129 lb 12.8 oz (58.9 kg)    General: Patient appears comfortable at rest. HEENT: Conjunctiva and lids normal. Neck: Supple, no elevated JVP or carotid bruits, no thyromegaly. Lungs: Clear to auscultation, nonlabored breathing at rest. Cardiac: Regular rate and rhythm, no S3, 1/6 systolic murmur. Extremities: No pitting edema.  ECG:  An ECG dated 08/01/2021 was personally reviewed today and demonstrated:  Atrial fibrillation with RVR and repolarization abnormalities.  Recent Labwork: 05/26/2021: B Natriuretic Peptide 954.0 05/29/2021: Magnesium 1.8 08/01/2021: ALT 20; AST 18; Hemoglobin 13.2; Platelets 166 08/03/2021: BUN 56; Creatinine, Ser 2.85; Potassium 4.2; Sodium 137   Other Studies Reviewed Today:  Echocardiogram 04/11/2022:  1. Left ventricular ejection fraction, by estimation, is 70 to 75%. The  left ventricle has hyperdynamic function. The left ventricle has no  regional wall motion abnormalities. There is moderate left ventricular  hypertrophy. Left ventricular diastolic  parameters are indeterminate.   2. Right ventricular systolic function is normal. The right ventricular  size is normal.   3. The aortic valve is tricuspid.   Assessment and Plan:  1.  History of large pericardial effusion initially managed with pericardiocentesis and due to reaccumulation ultimately status post pericardial window.  He has done well in the interim and had a recent follow-up echocardiogram showing no residual pericardial effusion.  Likelihood of recurrence remains quite low at this time.   Would keep follow-up with PCP at this point.  2.  Essential hypertension, blood pressure control is reasonable.  He is on multimodal therapy including Diovan, labetalol, hydralazine, Norvasc, and clonidine.  Keep follow-up with PCP.  Medication Adjustments/Labs and Tests Ordered: Current medicines are reviewed at length with the patient today.  Concerns regarding medicines are outlined above.   Tests Ordered: No orders of the defined types were placed in this encounter.   Medication Changes: No orders of the defined types were placed in this encounter.   Disposition:  Follow up prn  Signed, Satira Sark, MD, [facility] 04/24/2022 2:01 PM    [facility] [place], [place], [place] [address] Phone: [phone]; Fax: [phone]  [phone]  

## 2023-07-21 ENCOUNTER — Encounter (HOSPITAL_COMMUNITY): Payer: Self-pay | Admitting: Emergency Medicine

## 2023-07-21 ENCOUNTER — Other Ambulatory Visit: Payer: Self-pay

## 2023-07-21 ENCOUNTER — Emergency Department (HOSPITAL_COMMUNITY)
Admission: EM | Admit: 2023-07-21 | Discharge: 2023-07-21 | Disposition: A | Discharge status: Home / Self Care | Payer: Medicare Other | Attending: Emergency Medicine | Admitting: Emergency Medicine

## 2023-07-21 DIAGNOSIS — N189 Chronic kidney disease, unspecified: Secondary | ICD-10-CM | POA: Insufficient documentation

## 2023-07-21 DIAGNOSIS — N472 Paraphimosis: Secondary | ICD-10-CM | POA: Diagnosis not present

## 2023-07-21 DIAGNOSIS — I130 Hypertensive heart and chronic kidney disease with heart failure and stage 1 through stage 4 chronic kidney disease, or unspecified chronic kidney disease: Secondary | ICD-10-CM | POA: Diagnosis not present

## 2023-07-21 DIAGNOSIS — Z7982 Long term (current) use of aspirin: Secondary | ICD-10-CM | POA: Diagnosis not present

## 2023-07-21 DIAGNOSIS — Z79899 Other long term (current) drug therapy: Secondary | ICD-10-CM | POA: Insufficient documentation

## 2023-07-21 DIAGNOSIS — I509 Heart failure, unspecified: Secondary | ICD-10-CM | POA: Insufficient documentation

## 2023-07-21 DIAGNOSIS — R2240 Localized swelling, mass and lump, unspecified lower limb: Secondary | ICD-10-CM | POA: Diagnosis present

## 2023-07-21 LAB — URINALYSIS, ROUTINE W REFLEX MICROSCOPIC
Bacteria, UA: NONE SEEN
Bilirubin Urine: NEGATIVE
Glucose, UA: NEGATIVE mg/dL
Hgb urine dipstick: NEGATIVE
Ketones, ur: NEGATIVE mg/dL
Leukocytes,Ua: NEGATIVE
Nitrite: NEGATIVE
Protein, ur: 100 mg/dL — AB
Specific Gravity, Urine: 1.01 (ref 1.005–1.030)
pH: 6 (ref 5.0–8.0)

## 2023-07-21 NOTE — Discharge Instructions (Signed)
Please return to the emergency department if you have recurrence of penile swelling.  If you would like to see a urologist for consideration of circumcision, call the telephone number below.

## 2023-07-21 NOTE — ED Triage Notes (Signed)
Pt dropped off by staff from [facility]; c/o swollen area to his penis x 2-3 days, denies pain and injury

## 2023-07-21 NOTE — ED Provider Notes (Signed)
[place] EMERGENCY DEPARTMENT AT [facility] Provider Note   CSN: [id number] Arrival date & time: 07/21/23  1031     History  Chief Complaint  Patient presents with   Groin Swelling    Kurt Nelson is a 67 y.o. male.  HPI Patient presents for swelling.  Medical history includes CVA, seizure, HTN, GERD, substance abuse, anemia, CKD, AAA, CHF.  He is prescribed Lasix.  He noticed swelling to distal aspect of penis 3 days ago.  He denies any associated pain.  He has not had any difficulty urinating.  He was sent by staff at his nursing facility.    Home Medications Prior to Admission medications   Medication Sig Start Date End Date Taking? Authorizing Provider  amLODipine (NORVASC) 10 MG tablet Take 10 mg by mouth daily.    [provider]  aspirin 81 MG tablet Take 81 mg by mouth daily.    [provider]  atorvastatin (LIPITOR) 40 MG tablet Take 40 mg by mouth daily.    [provider]  calcitRIOL (ROCALTROL) 0.25 MCG capsule Take 0.25 mcg by mouth every Monday, Wednesday, and Friday. 04/30/21   [provider]  Cholecalciferol (VITAMIN D3) 25 MCG (1000 UT) CAPS Take 1 capsule by mouth daily. Patient not taking: Reported on 04/24/2022    [provider]  cloNIDine (CATAPRES) 0.1 MG tablet Take 1 tablet (0.1 mg total) by mouth 2 (two) times daily. 08/03/21   Rowe Clack, PA-C  cyanocobalamin 1000 MCG tablet Take 1 tablet (1,000 mcg total) by mouth daily. Patient not taking: Reported on 04/24/2022 06/02/21   Zannie Cove, MD  dorzolamide-timolol (COSOPT) 22.3-6.8 MG/ML ophthalmic solution Place 1 drop into the left eye 2 (two) times daily.    [provider]  fish oil-omega-3 fatty acids 1000 MG capsule Take 1 g by mouth in the morning and at bedtime.    [provider]  folic acid (FOLVITE) 1 MG tablet Take 1 mg by mouth daily.    [provider]  furosemide (LASIX) 40 MG tablet Take 40 mg by mouth  See admin instructions. Take 40 mg by mouth on Wednesday and Friday.    [provider]  hydrALAZINE (APRESOLINE) 50 MG tablet Take 1 tablet (50 mg total) by mouth every 8 (eight) hours. 08/03/21   Rowe Clack, PA-C  labetalol (NORMODYNE) 300 MG tablet Take 1 tablet (300 mg total) by mouth 2 (two) times daily. 08/03/21   Rowe Clack, PA-C  levothyroxine (SYNTHROID) 125 MCG tablet Take 125 mcg by mouth daily before breakfast.    [provider]  LUMIGAN 0.01 % SOLN Place 1 drop into both eyes at bedtime. 04/30/21   [provider]  olopatadine (PATANOL) 0.1 % ophthalmic solution Place 1 drop into both eyes daily. 04/30/21   [provider]  omeprazole (PRILOSEC) 20 MG capsule Take 20 mg by mouth daily. Patient not taking: Reported on 04/24/2022    [provider]  pantoprazole (PROTONIX) 20 MG tablet Take 20 mg by mouth daily. 03/27/22   [provider]  phenytoin (DILANTIN) 100 MG ER capsule Take 300 mg by mouth at bedtime.     [provider]  sodium bicarbonate 650 MG tablet Take 650 mg by mouth 2 (two) times daily.    [provider]  sodium zirconium cyclosilicate (LOKELMA) 5 g packet Take 5 g by mouth See admin instructions. Take 5 g by mouth on Monday and Thursday  [provider]  valsartan (DIOVAN) 40 MG tablet Take 40 mg by mouth daily.    [provider]      Allergies    Penicillins    Review of Systems   Review of Systems  Genitourinary:  Positive for penile swelling.  All other systems reviewed and are negative.   Physical Exam Updated Vital Signs BP (!) 148/107 (BP Location: Right Arm)   Pulse 72   Temp 98.7 F (37.1 C) (Oral)   Resp 18   Ht 5\' 7"  (1.702 m)   Wt 61.2 kg   SpO2 100%   BMI 21.14 kg/m  Physical Exam Vitals and nursing note reviewed. Exam conducted with a chaperone present.  Constitutional:      General: He is not in acute distress.    Appearance: Normal  appearance. He is well-developed. He is not ill-appearing, toxic-appearing or diaphoretic.  HENT:     Head: Normocephalic and atraumatic.     Right Ear: External ear normal.     Left Ear: External ear normal.     Nose: Nose normal.     Mouth/Throat:     Mouth: Mucous membranes are moist.  Eyes:     Extraocular Movements: Extraocular movements intact.     Conjunctiva/sclera: Conjunctivae normal.  Cardiovascular:     Rate and Rhythm: Normal rate and regular rhythm.  Pulmonary:     Effort: Pulmonary effort is normal. No respiratory distress.  Abdominal:     General: There is no distension.     Palpations: Abdomen is soft.     Tenderness: There is no abdominal tenderness.  Genitourinary:    Comments: Edematous area just proximal to glans.  Glans is normal in appearance as is remainder of penis.  Testicles are nontender nonswollen. Musculoskeletal:        General: No deformity.     Cervical back: Normal range of motion and neck supple.  Skin:    General: Skin is warm and dry.     Coloration: Skin is not jaundiced or pale.  Neurological:     Mental Status: He is alert and oriented to person, place, and time. Mental status is at baseline.  Psychiatric:        Mood and Affect: Mood normal.        Behavior: Behavior normal.     ED Results / Procedures / Treatments   Labs (all labs ordered are listed, but only abnormal results are displayed) Labs Reviewed  URINALYSIS, ROUTINE W REFLEX MICROSCOPIC - Abnormal; Notable for the following components:      Result Value   Protein, ur 100 (*)    All other components within normal limits    EKG None  Radiology No results found.  Procedures Procedures    Medications Ordered in ED Medications - No data to display  ED Course/ Medical Decision Making/ A&P                                 Medical Decision Making Amount and/or Complexity of Data Reviewed Labs: ordered.   Patient presenting for painless area of swelling to  penis.  Vital signs are normal on arrival.  Patient is well-appearing on exam.  Inspection of genitourinary area was performed with nurse chaperone present.  He has a nontender edematous area of his penis just proximal to the glans.  There were no other findings on exam.  Patient denies any difficulty urinating.  Although I am unable to appreciate a foreskin, patient is adamant that he is not circumcised. I spoke with urologist on-call, Dr. Ronne Binning, who evaluated the and reduced paraphimosis at bedside.  Patient was discharged in stable condition.        Final Clinical Impression(s) / ED Diagnoses Final diagnoses:  Paraphimosis    Rx / DC Orders ED Discharge Orders     None         Gloris Manchester, MD 07/21/23 1246

## 2023-07-21 NOTE — ED Notes (Signed)
Urinal provided for pt to urine sample, pt unable to obtain at present.

## 2023-09-08 ENCOUNTER — Ambulatory Visit: Payer: Medicare Other | Admitting: Urology

## 2023-09-08 VITALS — BP 156/88 | HR 73

## 2023-09-08 DIAGNOSIS — N472 Paraphimosis: Secondary | ICD-10-CM | POA: Diagnosis not present

## 2023-09-08 DIAGNOSIS — N1832 Chronic kidney disease, stage 3b: Secondary | ICD-10-CM | POA: Diagnosis not present

## 2023-09-08 NOTE — Progress Notes (Signed)
 09/08/2023 2:06 PM   Kurt Nelson 1956-02-07 [id number]  Referring provider: Melvenia Motto, MD [address] [place],  [place] [address]  No chief complaint on file.   HPI:  New pt -    1) paraphimosis - seen ER Nov 2024. Urologist on-call, Dr. Sherrilee, who evaluated the patient and reduced paraphimosis at bedside.  Not circumcised.   2) CKD - with Cr 2.5 and GFR 28 in Mar 2024. Renal US  in 2023 with no hydronephrosis.   Today, seen for the above. No foreskin or voiding complaints.   Prior CVA. Uses a walker. 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   PMH: Past Medical History:  Diagnosis Date   AAA (abdominal aortic aneurysm) (HCC)    Anemia, normocytic normochromic    CKD (chronic kidney disease) stage 3, GFR 30-59 ml/min (HCC)    Cocaine abuse (HCC)    ETOH abuse    GERD (gastroesophageal reflux disease)    Hyperglycemia    Hypertension    Hypothyroidism    ICH (intracerebral hemorrhage) (HCC)    Seizures (HCC)    Stroke [facility])     Surgical History: Past Surgical History:  Procedure Laterality Date   EYE SURGERY     bilateral cataracts   PERICARDIOCENTESIS N/A 05/30/2021   Procedure: PERICARDIOCENTESIS;  Surgeon: Darron Deatrice LABOR, MD;  [facility]: [facility] INVASIVE CV LAB;  Service: Cardiovascular;  Laterality: N/A;   XI ROBOTIC ASSISTED PERICARDIAL WINDOW Right 07/30/2021   Procedure: XI ROBOTIC ASSISTED THORACOSCOPY PERICARDIAL WINDOW;  Surgeon: Shyrl Linnie KIDD, MD;  Location: [facility] OR;  Service: Thoracic;  Laterality: Right;    Home Medications:  Allergies as of 09/08/2023       Reactions   Penicillins Itching, Rash        Medication List        Accurate as of September 08, 2023  2:06 PM. If you have any questions, ask your nurse or doctor.          amLODipine  10 MG tablet Commonly known as: NORVASC  Take 10 mg by mouth daily.   aspirin  81 MG tablet Take 81 mg by mouth daily.   atorvastatin  40 MG tablet Commonly known  as: LIPITOR Take 40 mg by mouth daily.   calcitRIOL  0.25 MCG capsule Commonly known as: ROCALTROL  Take 0.25 mcg by mouth every Monday, Wednesday, and Friday.   cloNIDine  0.1 MG tablet Commonly known as: CATAPRES  Take 1 tablet (0.1 mg total) by mouth 2 (two) times daily.   cyanocobalamin  1000 MCG tablet Commonly known as: VITAMIN B12 Take 1 tablet (1,000 mcg total) by mouth daily.   dorzolamide -timolol  2-0.5 % ophthalmic solution Commonly known as: COSOPT  Place 1 drop into the left eye 2 (two) times daily.   fish oil-omega-3 fatty acids 1000 MG capsule Take 1 g by mouth in the morning and at bedtime.   folic acid 1 MG tablet Commonly known as: FOLVITE Take 1 mg by mouth daily.   furosemide  40 MG tablet Commonly known as: LASIX  Take 40 mg by mouth See admin instructions. Take 40 mg by mouth on Wednesday and Friday.   hydrALAZINE  50 MG tablet Commonly known as: APRESOLINE  Take 1 tablet (50 mg total) by mouth every 8 (eight) hours.   labetalol  300 MG tablet Commonly known as: NORMODYNE  Take 1 tablet (300 mg total) by mouth 2 (two) times daily.   levothyroxine  125 MCG tablet Commonly known as: SYNTHROID  Take 125 mcg by mouth daily before  breakfast.   Lokelma  5 g packet Generic drug: sodium zirconium cyclosilicate  Take 5 g by mouth See admin instructions. Take 5 g by mouth on Monday and Thursday   Lumigan 0.01 % Soln Generic drug: bimatoprost Place 1 drop into both eyes at bedtime.   olopatadine  0.1 % ophthalmic solution Commonly known as: PATANOL Place 1 drop into both eyes daily.   omeprazole 20 MG capsule Commonly known as: PRILOSEC Take 20 mg by mouth daily.   pantoprazole  20 MG tablet Commonly known as: PROTONIX  Take 20 mg by mouth daily.   phenytoin  100 MG ER capsule Commonly known as: DILANTIN  Take 300 mg by mouth at bedtime.   sodium bicarbonate  650 MG tablet Take 650 mg by mouth 2 (two) times daily.   valsartan 40 MG tablet Commonly known as:  DIOVAN Take 40 mg by mouth daily.   Vitamin D3 25 MCG (1000 UT) Caps Take 1 capsule by mouth daily.        Allergies:  Allergies  Allergen Reactions   Penicillins Itching and Rash    Family History: Family History  Problem Relation Age of Onset   Hypertension Mother    Hypertension Sister    Hypertension Sister     Social History:  reports that he has been smoking cigarettes. He has never used smokeless tobacco. He reports that he does not currently use alcohol. He reports that he does not currently use drugs.   Physical Exam: There were no vitals taken for this visit.  Constitutional:  Alert and oriented, No acute distress. HEENT: [place] AT, moist mucus membranes.  Trachea midline, no masses. Cardiovascular: No clubbing, cyanosis, or edema. Respiratory: Normal respiratory effort, no increased work of breathing. GI: Abdomen is soft, nontender, nondistended, no abdominal masses GU: No CVA tenderness Lymph: No cervical or inguinal lymphadenopathy. Skin: No rashes, bruises or suspicious lesions. Neurologic: Grossly intact, no focal deficits, moving all 4 extremities. Psychiatric: Normal mood and affect. GU: Penis uncircumcised, normal foreskin - no phimosis or paraphimosis, no lesions; testicles descended bilaterally and palpably normal, bilateral epididymis palpably normal, scrotum normal DRE: Prostate 40 g, smooth without hard area or nodule   Laboratory Data: Lab Results  Component Value Date   WBC 9.5 08/01/2021   HGB 13.2 08/01/2021   HCT 40.7 08/01/2021   MCV 86.2 08/01/2021   PLT 166 08/01/2021    Lab Results  Component Value Date   CREATININE 2.85 (H) 08/03/2021    No results found for: PSA  No results found for: TESTOSTERONE  No results found for: HGBA1C  Urinalysis    Component Value Date/Time   COLORURINE YELLOW 07/21/2023 1201   APPEARANCEUR CLEAR 07/21/2023 1201   LABSPEC 1.010 07/21/2023 1201   PHURINE 6.0 07/21/2023 1201   GLUCOSEU  NEGATIVE 07/21/2023 1201   HGBUR NEGATIVE 07/21/2023 1201   BILIRUBINUR NEGATIVE 07/21/2023 1201   KETONESUR NEGATIVE 07/21/2023 1201   PROTEINUR 100 (A) 07/21/2023 1201   UROBILINOGEN 1.0 09/06/2014 1929   NITRITE NEGATIVE 07/21/2023 1201   LEUKOCYTESUR NEGATIVE 07/21/2023 1201    Lab Results  Component Value Date   BACTERIA NONE SEEN 07/21/2023    Pertinent Imaging:  Results for orders placed during the hospital encounter of 09/22/05  DG Abd 1 View  Narrative Clinical Data: On ventilator. Anterior cerebral bleed. Panda placement. PORTABLE ABDOMEN (18:00 HOURS): Findings: Tip of the feeding tube is in the fundus of the stomach as is the tip of the NG tube. The bowel gas pattern is nonspecific.  Impression  Tip of feeding tube in fundus of stomach.  Provider: Darleene Costa  No results found for this or any previous visit.  No results found for this or any previous visit.  No results found for this or any previous visit.  Results for orders placed during the hospital encounter of 02/05/22  US  RENAL  Narrative CLINICAL DATA:  Chronic kidney disease.  EXAM: RENAL / URINARY TRACT ULTRASOUND COMPLETE  COMPARISON:  Renal ultrasound 07/07/2012.  FINDINGS: Right Kidney:  Renal measurements: 9.0 x 4.5 x 3.5 cm = volume: 74 mL. Echogenicity is increased. There is no hydronephrosis. There are 2 renal cysts measuring up to 1.5 cm.  Left Kidney:  Renal measurements: 8.6 x 4.4 x 4.2 cm = volume: 82 mL. Echogenicity is increased. There is no hydronephrosis. There is no focal renal lesion.  Bladder:  Appears normal for degree of bladder distention. 30 cc of postvoid residual.  Other:  None.  IMPRESSION: 1. Echogenic kidneys likely related to medical renal disease. 2. No hydronephrosis. 3. Small right renal cysts. 4. 30 cc of postvoid residual in the bladder.   Electronically Signed By: Greig Pique M.D. On: 02/06/2022 16:16    Assessment & Plan:     Paraphimosis - no recurrence. No phimosis. Normal foreskin without lesion. Disc proper foreskin care and position.   CKD - no hydro on US .    No follow-ups on file.  Donnice Brooks, MD  [facility]  [address] [place], [place] [address] [phone]

## 2024-03-30 ENCOUNTER — Other Ambulatory Visit (HOSPITAL_COMMUNITY): Payer: Self-pay | Admitting: Family Medicine

## 2024-03-30 DIAGNOSIS — F17200 Nicotine dependence, unspecified, uncomplicated: Secondary | ICD-10-CM

## 2024-03-30 DIAGNOSIS — F17210 Nicotine dependence, cigarettes, uncomplicated: Secondary | ICD-10-CM

## 2024-09-13 ENCOUNTER — Encounter: Payer: Self-pay | Admitting: Physician Assistant

## 2024-09-13 ENCOUNTER — Inpatient Hospital Stay: Attending: Physician Assistant | Admitting: Physician Assistant

## 2024-09-13 ENCOUNTER — Inpatient Hospital Stay

## 2024-09-13 VITALS — BP 135/81 | HR 82 | Temp 97.9°F | Resp 18

## 2024-09-13 DIAGNOSIS — D649 Anemia, unspecified: Secondary | ICD-10-CM | POA: Diagnosis not present

## 2024-09-13 DIAGNOSIS — F17210 Nicotine dependence, cigarettes, uncomplicated: Secondary | ICD-10-CM | POA: Insufficient documentation

## 2024-09-13 LAB — IRON AND TIBC
Iron: 76 ug/dL (ref 45–182)
Saturation Ratios: 30 % (ref 17.9–39.5)
TIBC: 253 ug/dL (ref 250–450)
UIBC: 178 ug/dL

## 2024-09-13 LAB — CBC WITH DIFFERENTIAL/PLATELET
Abs Immature Granulocytes: 0.01 K/uL (ref 0.00–0.07)
Basophils Absolute: 0 K/uL (ref 0.0–0.1)
Basophils Relative: 1 %
Eosinophils Absolute: 0.2 K/uL (ref 0.0–0.5)
Eosinophils Relative: 4 %
HCT: 33 % — ABNORMAL LOW (ref 39.0–52.0)
Hemoglobin: 10.6 g/dL — ABNORMAL LOW (ref 13.0–17.0)
Immature Granulocytes: 0 %
Lymphocytes Relative: 23 %
Lymphs Abs: 1.1 K/uL (ref 0.7–4.0)
MCH: 29 pg (ref 26.0–34.0)
MCHC: 32.1 g/dL (ref 30.0–36.0)
MCV: 90.4 fL (ref 80.0–100.0)
Monocytes Absolute: 0.4 K/uL (ref 0.1–1.0)
Monocytes Relative: 9 %
Neutro Abs: 3.1 K/uL (ref 1.7–7.7)
Neutrophils Relative %: 63 %
Platelets: 278 K/uL (ref 150–400)
RBC: 3.65 MIL/uL — ABNORMAL LOW (ref 4.22–5.81)
RDW: 12.1 % (ref 11.5–15.5)
WBC: 4.8 K/uL (ref 4.0–10.5)
nRBC: 0 % (ref 0.0–0.2)

## 2024-09-13 LAB — RETIC PANEL
Immature Retic Fract: 8.3 % (ref 2.3–15.9)
RBC.: 3.67 MIL/uL — ABNORMAL LOW (ref 4.22–5.81)
Retic Count, Absolute: 36 K/uL (ref 19.0–186.0)
Retic Ct Pct: 1 % (ref 0.4–3.1)
Reticulocyte Hemoglobin: 32.7 pg

## 2024-09-13 LAB — COMPREHENSIVE METABOLIC PANEL WITH GFR
ALT: 11 U/L (ref 0–44)
AST: 22 U/L (ref 15–41)
Albumin: 3.8 g/dL (ref 3.5–5.0)
Alkaline Phosphatase: 188 U/L — ABNORMAL HIGH (ref 38–126)
Anion gap: 9 (ref 5–15)
BUN: 23 mg/dL (ref 8–23)
CO2: 26 mmol/L (ref 22–32)
Calcium: 8.7 mg/dL — ABNORMAL LOW (ref 8.9–10.3)
Chloride: 106 mmol/L (ref 98–111)
Creatinine, Ser: 2.58 mg/dL — ABNORMAL HIGH (ref 0.61–1.24)
GFR, Estimated: 26 mL/min — ABNORMAL LOW
Glucose, Bld: 93 mg/dL (ref 70–99)
Potassium: 4.3 mmol/L (ref 3.5–5.1)
Sodium: 141 mmol/L (ref 135–145)
Total Bilirubin: <0.2 mg/dL (ref 0.0–1.2)
Total Protein: 6.9 g/dL (ref 6.5–8.1)

## 2024-09-13 LAB — TECHNOLOGIST SMEAR REVIEW: Plt Morphology: NORMAL

## 2024-09-13 LAB — VITAMIN B12: Vitamin B-12: 448 pg/mL (ref 180–914)

## 2024-09-13 LAB — FERRITIN: Ferritin: 42 ng/mL (ref 24–336)

## 2024-09-13 NOTE — Progress Notes (Signed)
 " [facility]   INITIAL CONSULT NOTE  Patient Care Team: Sherleen Gate, NP as PCP - General (Nurse Practitioner) Debera Jayson MATSU, MD as PCP - Cardiology (Cardiology)  Hematological/Oncological History 07/08/2024: WBC 4.1, Hgb 9.5 (L), MCV 91, Plt 210, Creatinine 2.15, Iron 49, TIBC 220 (L), saturation 22%, ferritin 43 09/13/2024: Establish care with [facility] Hematology  CHIEF COMPLAINTS/PURPOSE OF CONSULTATION:  Normocytic anemia   HISTORY OF PRESENTING ILLNESS:  Kurt Nelson 69 y.o. male with medical history significant for stage III CKD, acid reflux, hypertension, hypothyroidism, stroke and seizures presents to the hematology department for evaluation for normocytic anemia.  He is accompanied by caregiver who helps with transportation.  On exam today, Kurt Nelson reports that his energy and appetite are fairly stable.  He uses a walker and/or wheelchair to help with ambulation due to mobility issues from prior CVA.  He denies nausea, vomiting or bowel habit changes.  He denies easy bruising or overt signs of bleeding.  He has intermittent episode of a productive cough without any other infectious symptoms.  He denies fevers, chills, night sweats, shortness of breath, chest pain, headaches, dizziness or neuropathy.  Rest of the 10 point ROS as below.  MEDICAL HISTORY:  Past Medical History:  Diagnosis Date   AAA (abdominal aortic aneurysm)    Anemia, normocytic normochromic    CKD (chronic kidney disease) stage 3, GFR 30-59 ml/min (HCC)    Cocaine abuse (HCC)    ETOH abuse    GERD (gastroesophageal reflux disease)    Hyperglycemia    Hypertension    Hypothyroidism    ICH (intracerebral hemorrhage) (HCC)    Seizures (HCC)    Stroke [facility])     SURGICAL HISTORY: Past Surgical History:  Procedure Laterality Date   EYE SURGERY     bilateral cataracts   PERICARDIOCENTESIS N/A 05/30/2021   Procedure: PERICARDIOCENTESIS;  Surgeon: Darron Deatrice LABOR, MD;  Location: [facility]  INVASIVE CV LAB;  Service: Cardiovascular;  Laterality: N/A;   XI ROBOTIC ASSISTED PERICARDIAL WINDOW Right 07/30/2021   Procedure: XI ROBOTIC ASSISTED THORACOSCOPY PERICARDIAL WINDOW;  Surgeon: Shyrl Linnie KIDD, MD;  Location: [facility] OR;  Service: Thoracic;  Laterality: Right;    SOCIAL HISTORY: Social History   Socioeconomic History   Marital status: Single    Spouse name: Not on file   Number of children: Not on file   Years of education: 12   Highest education level: Not on file  Occupational History   Not on file  Tobacco Use   Smoking status: Light Smoker    Current packs/day: 0.25    Types: Cigarettes   Smokeless tobacco: Never  Vaping Use   Vaping status: Never Used  Substance and Sexual Activity   Alcohol use: Not Currently   Drug use: Not Currently    Comment: Abuse   Sexual activity: Not on file  Other Topics Concern   Not on file  Social History Narrative   Not on file   Social Drivers of Health   Tobacco Use: High Risk (09/13/2024)   Patient History    Smoking Tobacco Use: Light Smoker    Smokeless Tobacco Use: Never    Passive Exposure: Not on file  Financial Resource Strain: Not on file  Food Insecurity: Not on file  Transportation Needs: Not on file  Physical Activity: Not on file  Stress: Not on file  Social Connections: Not on file  Intimate Partner Violence: Not on file  Depression (PHQ2-9): Low Risk (09/13/2024)  Depression (PHQ2-9)    PHQ-2 Score: 0  Alcohol Screen: Not on file  Housing: Not on file  Utilities: Not on file  Health Literacy: Not on file    FAMILY HISTORY: Family History  Problem Relation Age of Onset   Hypertension Mother    Hypertension Sister    Hypertension Sister     ALLERGIES:  is allergic to penicillins.  MEDICATIONS:  Current Outpatient Medications  Medication Sig Dispense Refill   azelastine (OPTIVAR) 0.05 % ophthalmic solution Apply to eye.     candesartan (ATACAND) 4 MG tablet Take 4 mg by mouth daily.      cloNIDine  (CATAPRES ) 0.2 MG tablet Take 0.2 mg by mouth 3 (three) times daily.     hydrALAZINE  (APRESOLINE ) 100 MG tablet Take 100 mg by mouth 3 (three) times daily.     NIFEdipine (ADALAT CC) 30 MG 24 hr tablet Take by mouth.     amLODipine  (NORVASC ) 10 MG tablet Take 10 mg by mouth daily.     aspirin  81 MG tablet Take 81 mg by mouth daily.     atorvastatin  (LIPITOR) 40 MG tablet Take 40 mg by mouth daily.     calcitRIOL  (ROCALTROL ) 0.25 MCG capsule Take 0.25 mcg by mouth every Monday, Wednesday, and Friday.     Cholecalciferol  (VITAMIN D3) 25 MCG (1000 UT) CAPS Take 1 capsule by mouth daily.     cyanocobalamin  1000 MCG tablet Take 1 tablet (1,000 mcg total) by mouth daily. 4 tablet 0   dorzolamide -timolol  (COSOPT ) 22.3-6.8 MG/ML ophthalmic solution Place 1 drop into the left eye 2 (two) times daily.     fish oil-omega-3 fatty acids 1000 MG capsule Take 1 g by mouth in the morning and at bedtime.     folic acid (FOLVITE) 1 MG tablet Take 1 mg by mouth daily.     furosemide  (LASIX ) 40 MG tablet Take 40 mg by mouth See admin instructions. Take 40 mg by mouth on Wednesday and Friday.     labetalol  (NORMODYNE ) 300 MG tablet Take 1 tablet (300 mg total) by mouth 2 (two) times daily. 60 tablet 1   levothyroxine  (SYNTHROID ) 125 MCG tablet Take 125 mcg by mouth daily before breakfast.     LUMIGAN 0.01 % SOLN Place 1 drop into both eyes at bedtime.     olopatadine  (PATANOL) 0.1 % ophthalmic solution Place 1 drop into both eyes daily.     omeprazole (PRILOSEC) 20 MG capsule Take 20 mg by mouth daily.     pantoprazole  (PROTONIX ) 20 MG tablet Take 20 mg by mouth daily.     phenytoin  (DILANTIN ) 100 MG ER capsule Take 300 mg by mouth at bedtime.      sodium bicarbonate  650 MG tablet Take 650 mg by mouth 2 (two) times daily.     sodium zirconium cyclosilicate  (LOKELMA ) 5 g packet Take 5 g by mouth See admin instructions. Take 5 g by mouth on Monday and Thursday     valsartan (DIOVAN) 40 MG tablet Take  40 mg by mouth daily.     No current facility-administered medications for this visit.    REVIEW OF SYSTEMS:   Constitutional: ( - ) fevers, ( - )  chills , ( - ) night sweats Eyes: ( - ) blurriness of vision, ( - ) double vision, ( - ) watery eyes Ears, nose, mouth, throat, and face: ( - ) mucositis, ( - ) sore throat Respiratory: ( - ) cough, ( - ) dyspnea, ( - )  wheezes Cardiovascular: ( - ) palpitation, ( - ) chest discomfort, ( - ) lower extremity swelling Gastrointestinal:  ( - ) nausea, ( - ) heartburn, ( - ) change in bowel habits Skin: ( - ) abnormal skin rashes Lymphatics: ( - ) new lymphadenopathy, ( - ) easy bruising Neurological: ( - ) numbness, ( - ) tingling, ( - ) new weaknesses Behavioral/Psych: ( - ) mood change, ( - ) new changes  All other systems were reviewed with the patient and are negative.  PHYSICAL EXAMINATION: ECOG PERFORMANCE STATUS: 1 - Symptomatic but completely ambulatory  Vitals:   09/13/24 1314  BP: 135/81  Pulse: 82  Resp: 18  Temp: 97.9 F (36.6 C)  SpO2: 100%   There were no vitals filed for this visit.  GENERAL: male in no acute distress. In a wheelchair during visit.  SKIN: skin color, texture, turgor are normal, no rashes or significant lesions EYES: conjunctiva are pink and non-injected, sclera clear LUNGS: clear to auscultation and percussion with normal breathing effort HEART: regular rate & rhythm and no murmurs and no lower extremity edema Musculoskeletal: no cyanosis of digits and no clubbing  PSYCH: alert & oriented x 3, fluent speech NEURO: no focal motor/sensory deficits  LABORATORY DATA:  I have reviewed the data as listed    Latest Ref Rng & Units 08/01/2021   12:45 AM 07/31/2021    3:25 AM 07/27/2021   12:30 PM  CBC  WBC 4.0 - 10.5 K/uL 9.5  9.9  6.5   Hemoglobin 13.0 - 17.0 g/dL 86.7  87.9  87.5   Hematocrit 39.0 - 52.0 % 40.7  37.3  38.7   Platelets 150 - 400 K/uL 166  174  189        Latest Ref Rng & Units  08/03/2021    9:34 AM 08/01/2021   12:45 AM 07/31/2021    3:25 AM  CMP  Glucose 70 - 99 mg/dL 891  879  879   BUN 8 - 23 mg/dL 56  32  30   Creatinine 0.61 - 1.24 mg/dL 7.14  7.52  8.02   Sodium 135 - 145 mmol/L 137  138  137   Potassium 3.5 - 5.1 mmol/L 4.2  4.6  4.7   Chloride 98 - 111 mmol/L 104  108  109   CO2 22 - 32 mmol/L 25  23  22    Calcium  8.9 - 10.3 mg/dL 8.6  9.2  9.0   Total Protein 6.5 - 8.1 g/dL  7.0    Total Bilirubin 0.3 - 1.2 mg/dL  0.6    Alkaline Phos 38 - 126 U/L  166    AST 15 - 41 U/L  18    ALT 0 - 44 U/L  20     RADIOGRAPHIC STUDIES: I have personally reviewed the radiological images as listed and agreed with the findings in the report. No results found.  ASSESSMENT & PLAN Kurt Nelson is a 69 y.o. male who presents to the clinic for evaluation of normocytic anemia.   #Normocytic anemia: --Likely multifactorial from CKD and possibly medication induced with dilantin . --Labs today to further evaluate with CBC, CMP, retic panel, peripheral smear, iron panel, B12, MMA, folate, SPEP/IFE and serum free light chains --Consider epo injections if workup rules out other causes of anemia with Hgb goal of >10.  --Tentative follow up in 2-3 weeks for labs and APP visit with plans to start epo injection.   Orders Placed This  Encounter  Procedures   Comprehensive metabolic panel    Standing Status:   Future    Number of Occurrences:   1    Expected Date:   09/13/2024    Expiration Date:   12/12/2024   CBC with Differential    Standing Status:   Future    Number of Occurrences:   1    Expected Date:   09/13/2024    Expiration Date:   12/12/2024   Retic Panel    Standing Status:   Future    Number of Occurrences:   1    Expected Date:   09/13/2024    Expiration Date:   12/12/2024   Technologist smear review    Standing Status:   Future    Number of Occurrences:   1    Expected Date:   09/13/2024    Expiration Date:   12/12/2024    Clinical information::   anemia    Ferritin    Standing Status:   Future    Number of Occurrences:   1    Expected Date:   09/13/2024    Expiration Date:   12/12/2024   Vitamin B12    Standing Status:   Future    Number of Occurrences:   1    Expected Date:   09/13/2024    Expiration Date:   12/12/2024   Iron and TIBC ([facility] ONLY)    Standing Status:   Future    Number of Occurrences:   1    Expected Date:   09/13/2024    Expiration Date:   12/12/2024   Erythropoietin     Standing Status:   Future    Number of Occurrences:   1    Expected Date:   09/13/2024    Expiration Date:   12/12/2024   Methylmalonic acid, serum    Standing Status:   Future    Number of Occurrences:   1    Expected Date:   09/13/2024    Expiration Date:   12/12/2024   Multiple Myeloma Panel (SPEP&IFE w/QIG)    Standing Status:   Future    Number of Occurrences:   1    Expected Date:   09/13/2024    Expiration Date:   12/12/2024   Kappa/lambda light chains    Standing Status:   Future    Number of Occurrences:   1    Expected Date:   09/13/2024    Expiration Date:   12/12/2024    All questions were answered. The patient knows to call the clinic with any problems, questions or concerns.  I have spent a total of 60 minutes minutes of face-to-face and non-face-to-face time, preparing to see the patient, obtaining and/or reviewing separately obtained history, performing a medically appropriate examination, counseling and educating the patient, ordering tests/procedures, documenting clinical information in the electronic health record, independently interpreting results and communicating results to the patient, and care coordination.   Johnston Police, PA-C Department of Hematology/Oncology [facility] "

## 2024-09-14 LAB — KAPPA/LAMBDA LIGHT CHAINS
Kappa free light chain: 74 mg/L — ABNORMAL HIGH (ref 3.3–19.4)
Kappa, lambda light chain ratio: 1.39 (ref 0.26–1.65)
Lambda free light chains: 53.3 mg/L — ABNORMAL HIGH (ref 5.7–26.3)

## 2024-09-14 LAB — ERYTHROPOIETIN: Erythropoietin: 6.7 m[IU]/mL (ref 2.6–18.5)

## 2024-09-15 LAB — MULTIPLE MYELOMA PANEL, SERUM
Albumin SerPl Elph-Mcnc: 3.2 g/dL (ref 2.9–4.4)
Albumin/Glob SerPl: 1 (ref 0.7–1.7)
Alpha 1: 0.2 g/dL (ref 0.0–0.4)
Alpha2 Glob SerPl Elph-Mcnc: 0.8 g/dL (ref 0.4–1.0)
B-Globulin SerPl Elph-Mcnc: 1.1 g/dL (ref 0.7–1.3)
Gamma Glob SerPl Elph-Mcnc: 1.3 g/dL (ref 0.4–1.8)
Globulin, Total: 3.5 g/dL (ref 2.2–3.9)
IgA: 285 mg/dL (ref 61–437)
IgG (Immunoglobin G), Serum: 1366 mg/dL (ref 603–1613)
IgM (Immunoglobulin M), Srm: 83 mg/dL (ref 20–172)
Total Protein ELP: 6.7 g/dL (ref 6.0–8.5)

## 2024-09-17 LAB — METHYLMALONIC ACID, SERUM: Methylmalonic Acid, Quantitative: 427 nmol/L — ABNORMAL HIGH (ref 0–378)

## 2024-09-25 NOTE — Progress Notes (Unsigned)
 "  VIRTUAL VISIT via TELEPHONE NOTE [facility]   I connected with Kurt Nelson  on 09/28/24 at  9:40 AM by telephone and verified that I am speaking with the correct person using two identifiers.  Location: Patient: Home Provider: [facility] Other persons involved in visit: Luke (assisted living staff / med tech)   I discussed the limitations, risks, security and privacy concerns of performing an evaluation and management service by telephone and the availability of in person appointments. I also discussed with the patient that there may be a patient responsible charge related to this service. The patient expressed understanding and agreed to proceed.  REASON FOR VISIT: Normocytic anemia  CURRENT THERAPY: Under workup  INTERVAL HISTORY:  Kurt Nelson is contacted today for follow-up of normocytic anemia.   He was last seen by Johnston Police, PA-C on 09/13/2024.   He returns today to discuss results of initial workup. He denies any interval changes since his visit 3 weeks ago. Mr. Bunt reports that his energy and appetite are fairly stable. He uses a walker and/or wheelchair to help with ambulation due to mobility issues from prior CVA.  He resides at [facility].   He denies nausea, vomiting or bowel habit changes.   He denies easy bruising or overt signs of bleeding.   He denies fevers, chills, night sweats, shortness of breath, chest pain, headaches, dizziness or neuropathy.  ASSESSMENT & PLAN:  1.  Normocytic anemia # Iron deficiency anemia # Anemia of CKD stage IV - Referred by Dr. Rachele, nephrologist - Lab review in EMR shows longstanding anemia since at least 2008, ranging from mild to severe (severe anemia in 2009 during hospitalization) - No record of EGD or colonoscopy in EMR  - Hematology workup (09/13/2024): Reticulocytes 1.0%.  Low-normal erythropoietin  6.7. Ferritin 42, iron saturation 30% SPEP and immunofixation  unremarkable normal FLC ratio.  Mild elevation in kappa and lambda free light chains in keeping with CKD. Technologist smear review unremarkable CMP with baseline CKD stage IV (creatinine 2.58, GFR 26) - Most recent CBC/D (09/13/2024): Hgb 10.6/MCV 90.4.  Normal WBCs and platelets. - Denies any rectal bleeding or melena.   - PLAN:  Recommend IV iron with Monoferric x 1.  Discussed potential side effects and risk of reaction - patient agreeable to proceed. - Rx to pharmacy to start ferrous gluconate  324 mg every other day. - Recommend referral to GI with possible EGD/colonoscopy for workup of iron deficiency anemia.   - No indications to start Retacrit/Procrit  at this time.   - Repeat labs and RTC in 3 months.  2.  Other nutritional deficiencies - Patient previously received B12 injections, but is not on any current supplementation. - Patient takes folic acid 1 mg daily - Labs from 09/13/2024 showed normal vitamin B12 at 448, but MMA mildly elevated 427.  Folate not checked. - PLAN: Continue folic acid 1 mg daily. - Rx to pharmacy to start vitamin B12 500 mcg daily. - Recheck B12 and folate at follow-up.    3.  Other history - He uses a walker and/or wheelchair to help with ambulation due to mobility issues from prior CVA.  He resides at [facility]. - PMH includes history of CKD stage IV, resistant hypertension, seizures, CVA, cerebral hemorrhage - History of substance abuse including alcohol and cocaine.  PLAN SUMMARY: >> IV Monoferric x 1 >> Referral entered to gastroenterology for GI workup of IDA >>  Labs in 3 months = CBC/D, CMP, ferritin, iron/TIBC, B12, MMA, folate >> OFFICE visit in 3 months (1 week after labs)     REVIEW OF SYSTEMS:   Review of Systems  Constitutional:  Negative for chills, diaphoresis, fever, malaise/fatigue and weight loss.  Respiratory:  Negative for cough and shortness of breath.   Cardiovascular:  Negative for chest pain and palpitations.   Gastrointestinal:  Negative for abdominal pain, blood in stool, melena, nausea and vomiting.  Neurological:  Negative for dizziness and headaches.     PHYSICAL EXAM: (per limitations of virtual telephone visit)  The patient is alert and oriented x 3, exhibiting adequate mentation, good mood, and ability to speak in full sentences and execute sound judgement.  WRAP UP:   I discussed the assessment and treatment plan with the patient. The patient was provided an opportunity to ask questions and all were answered. The patient agreed with the plan and demonstrated an understanding of the instructions.   The patient was advised to call back or seek an in-person evaluation if the symptoms worsen or if the condition fails to improve as anticipated.  I provided 22 minutes of non-face-to-face time during this encounter, including > 10 minutes of medical discussion.  Pleasant CHRISTELLA Barefoot, PA-C 09/28/24 10:03 AM  "

## 2024-09-28 ENCOUNTER — Inpatient Hospital Stay: Admitting: Physician Assistant

## 2024-09-28 ENCOUNTER — Encounter: Payer: Self-pay | Admitting: Physician Assistant

## 2024-09-28 DIAGNOSIS — E538 Deficiency of other specified B group vitamins: Secondary | ICD-10-CM

## 2024-09-28 DIAGNOSIS — D509 Iron deficiency anemia, unspecified: Secondary | ICD-10-CM | POA: Diagnosis not present

## 2024-09-28 DIAGNOSIS — D631 Anemia in chronic kidney disease: Secondary | ICD-10-CM | POA: Diagnosis not present

## 2024-09-28 DIAGNOSIS — D508 Other iron deficiency anemias: Secondary | ICD-10-CM

## 2024-09-28 DIAGNOSIS — N184 Chronic kidney disease, stage 4 (severe): Secondary | ICD-10-CM

## 2024-09-28 HISTORY — DX: Anemia in chronic kidney disease: D63.1

## 2024-09-28 HISTORY — DX: Iron deficiency anemia, unspecified: D50.9

## 2024-09-28 MED ORDER — FERROUS GLUCONATE 324 (38 FE) MG PO TABS: 324.0000 mg | ORAL_TABLET | ORAL | 1 refills | Status: AC

## 2024-09-28 MED ORDER — CYANOCOBALAMIN 500 MCG PO TABS: 500.0000 ug | ORAL_TABLET | Freq: Every day | ORAL | 3 refills | Status: AC

## 2024-09-29 ENCOUNTER — Inpatient Hospital Stay: Admitting: Physician Assistant

## 2024-09-29 ENCOUNTER — Inpatient Hospital Stay

## 2024-10-07 ENCOUNTER — Inpatient Hospital Stay: Attending: Physician Assistant

## 2024-10-07 VITALS — BP 153/90 | HR 62 | Temp 97.6°F | Resp 18

## 2024-10-07 DIAGNOSIS — D508 Other iron deficiency anemias: Secondary | ICD-10-CM

## 2024-10-07 DIAGNOSIS — D631 Anemia in chronic kidney disease: Secondary | ICD-10-CM

## 2024-10-07 MED ORDER — METHYLPREDNISOLONE SODIUM SUCC 125 MG IJ SOLR
125.0000 mg | Freq: Once | INTRAMUSCULAR | Status: AC
  Administered 2024-10-07: 125 mg via INTRAVENOUS
  Filled 2024-10-07: qty 2

## 2024-10-07 MED ORDER — SODIUM CHLORIDE 0.9 % IV SOLN
1000.0000 mg | Freq: Once | INTRAVENOUS | Status: AC
  Administered 2024-10-07: 1000 mg via INTRAVENOUS
  Filled 2024-10-07: qty 1000

## 2024-10-07 MED ORDER — FAMOTIDINE IN NACL 20-0.9 MG/50ML-% IV SOLN
20.0000 mg | Freq: Once | INTRAVENOUS | Status: AC
  Administered 2024-10-07: 20 mg via INTRAVENOUS
  Filled 2024-10-07: qty 50

## 2024-10-07 MED ORDER — SODIUM CHLORIDE 0.9 % IV SOLN: INTRAVENOUS | Status: DC

## 2024-10-07 MED ORDER — ACETAMINOPHEN 325 MG PO TABS
650.0000 mg | ORAL_TABLET | Freq: Once | ORAL | Status: AC
  Administered 2024-10-07: 650 mg via ORAL
  Filled 2024-10-07: qty 2

## 2024-10-07 MED ORDER — CETIRIZINE HCL 10 MG/ML IV SOLN
10.0000 mg | Freq: Once | INTRAVENOUS | Status: AC
  Administered 2024-10-07: 10 mg via INTRAVENOUS
  Filled 2024-10-07: qty 1

## 2024-10-07 NOTE — Patient Instructions (Signed)
 [facility] [place] - A DEPT OF [facility]  Discharge Instructions: Thank you for choosing [facility] to provide your oncology and hematology care.  If you have a lab appointment with the Cancer Center - please note that after April 8th, 2024, all labs will be drawn in the cancer center.  You do not have to check in or register with the main entrance as you have in the past but will complete your check-in in the cancer center.  Wear comfortable clothing and clothing appropriate for easy access to any Portacath or PICC line.   We strive to give you quality time with your provider. You may need to reschedule your appointment if you arrive late (15 or more minutes).  Arriving late affects you and other patients whose appointments are after yours.  Also, if you miss three or more appointments without notifying the office, you may be dismissed from the clinic at the provider's discretion.      For prescription refill requests, have your pharmacy contact our office and allow 72 hours for refills to be completed.    Today you received Monoferric IV iron infusion.     BELOW ARE SYMPTOMS THAT SHOULD BE REPORTED IMMEDIATELY: *FEVER GREATER THAN 100.4 F (38 C) OR HIGHER *CHILLS OR SWEATING *NAUSEA AND VOMITING THAT IS NOT CONTROLLED WITH YOUR NAUSEA MEDICATION *UNUSUAL SHORTNESS OF BREATH *UNUSUAL BRUISING OR BLEEDING *URINARY PROBLEMS (pain or burning when urinating, or frequent urination) *BOWEL PROBLEMS (unusual diarrhea, constipation, pain near the anus) TENDERNESS IN MOUTH AND THROAT WITH OR WITHOUT PRESENCE OF ULCERS (sore throat, sores in mouth, or a toothache) UNUSUAL RASH, SWELLING OR PAIN  UNUSUAL VAGINAL DISCHARGE OR ITCHING   Items with * indicate a potential emergency and should be followed up as soon as possible or go to the Emergency Department if any problems should occur.  Please show the CHEMOTHERAPY ALERT CARD or IMMUNOTHERAPY ALERT CARD  at check-in to the Emergency Department and triage nurse.  Should you have questions after your visit or need to cancel or reschedule your appointment, please contact [facility] [place] - A DEPT OF Eligha Bridegroom [facility] [phone]  and follow the prompts.  Office hours are 8:00 a.m. to 4:30 p.m. Monday - Friday. Please note that voicemails left after 4:00 p.m. may not be returned until the following business day.  We are closed weekends and major holidays. You have access to a nurse at all times for urgent questions. Please call the main number to the clinic [phone] and follow the prompts.  For any non-urgent questions, you may also contact your provider using MyChart. We now offer e-Visits for anyone 35 and older to request care online for non-urgent symptoms. For details visit [email or web address].   Also download the MyChart app! Go to the app store, search "MyChart", open the app, select [place], and log in with your MyChart username and password.

## 2024-10-07 NOTE — Progress Notes (Signed)
 Patient presents today for first iron infusion.  Patient is in satisfactory condition with no new complaints voiced.  Vital signs are stable.  We will proceed with infusion per provider orders.    Peripheral IV started with good blood return pre and post infusion.  Monoferric  1,000 mg given today per MD orders. Tolerated infusion without adverse affects. Vital signs stable. No complaints at this time. Discharged from clinic via wheelchair in stable condition. Alert and oriented x 3. F/U with [facility] as scheduled.

## 2024-12-20 ENCOUNTER — Inpatient Hospital Stay

## 2024-12-27 ENCOUNTER — Inpatient Hospital Stay: Admitting: Physician Assistant
